# Patient Record
Sex: Male | Born: 1970
Health system: Southern US, Community
[De-identification: ages and names within clinical notes are randomized; demographics above are authoritative.]

## PROBLEM LIST (undated history)

## (undated) DIAGNOSIS — M1990 Unspecified osteoarthritis, unspecified site: Secondary | ICD-10-CM

## (undated) DIAGNOSIS — K219 Gastro-esophageal reflux disease without esophagitis: Secondary | ICD-10-CM

## (undated) DIAGNOSIS — J45909 Unspecified asthma, uncomplicated: Secondary | ICD-10-CM

## (undated) DIAGNOSIS — F419 Anxiety disorder, unspecified: Secondary | ICD-10-CM

## (undated) DIAGNOSIS — M810 Age-related osteoporosis without current pathological fracture: Secondary | ICD-10-CM

## (undated) DIAGNOSIS — R0600 Dyspnea, unspecified: Secondary | ICD-10-CM

## (undated) DIAGNOSIS — G8929 Other chronic pain: Secondary | ICD-10-CM

## (undated) DIAGNOSIS — F32A Depression, unspecified: Secondary | ICD-10-CM

## (undated) DIAGNOSIS — G4730 Sleep apnea, unspecified: Secondary | ICD-10-CM

## (undated) DIAGNOSIS — I209 Angina pectoris, unspecified: Secondary | ICD-10-CM

## (undated) DIAGNOSIS — I499 Cardiac arrhythmia, unspecified: Secondary | ICD-10-CM

## (undated) DIAGNOSIS — E785 Hyperlipidemia, unspecified: Secondary | ICD-10-CM

## (undated) DIAGNOSIS — T8859XA Other complications of anesthesia, initial encounter: Secondary | ICD-10-CM

## (undated) DIAGNOSIS — I10 Essential (primary) hypertension: Secondary | ICD-10-CM

## (undated) DIAGNOSIS — E0590 Thyrotoxicosis, unspecified without thyrotoxic crisis or storm: Secondary | ICD-10-CM

## (undated) DIAGNOSIS — R519 Headache, unspecified: Secondary | ICD-10-CM

## (undated) DIAGNOSIS — T7840XA Allergy, unspecified, initial encounter: Secondary | ICD-10-CM

## (undated) DIAGNOSIS — S02609A Fracture of mandible, unspecified, initial encounter for closed fracture: Secondary | ICD-10-CM

## (undated) DIAGNOSIS — T4145XA Adverse effect of unspecified anesthetic, initial encounter: Secondary | ICD-10-CM

## (undated) DIAGNOSIS — F329 Major depressive disorder, single episode, unspecified: Secondary | ICD-10-CM

## (undated) DIAGNOSIS — Z8489 Family history of other specified conditions: Secondary | ICD-10-CM

## (undated) DIAGNOSIS — R51 Headache: Secondary | ICD-10-CM

## (undated) HISTORY — DX: Allergy, unspecified, initial encounter: T78.40XA

## (undated) HISTORY — PX: CARDIAC CATHETERIZATION: SHX172

## (undated) HISTORY — DX: Essential (primary) hypertension: I10

## (undated) HISTORY — DX: Anxiety disorder, unspecified: F41.9

## (undated) HISTORY — DX: Thyrotoxicosis, unspecified without thyrotoxic crisis or storm: E05.90

## (undated) HISTORY — DX: Unspecified asthma, uncomplicated: J45.909

## (undated) HISTORY — DX: Age-related osteoporosis without current pathological fracture: M81.0

## (undated) HISTORY — PX: ANKLE FRACTURE SURGERY: SHX122

## (undated) HISTORY — DX: Other chronic pain: G89.29

## (undated) HISTORY — DX: Headache: R51

## (undated) HISTORY — PX: HERNIA REPAIR: SHX51

## (undated) HISTORY — PX: ANKLE FUSION: SHX881

## (undated) HISTORY — PX: FRACTURE SURGERY: SHX138

## (undated) HISTORY — DX: Gastro-esophageal reflux disease without esophagitis: K21.9

## (undated) HISTORY — PX: MANDIBLE FRACTURE SURGERY: SHX706

## (undated) HISTORY — PX: FEMUR FRACTURE SURGERY: SHX633

## (undated) HISTORY — DX: Headache, unspecified: R51.9

---

## 2012-05-10 ENCOUNTER — Emergency Department: Payer: Self-pay | Admitting: Emergency Medicine

## 2015-03-18 ENCOUNTER — Encounter: Payer: Self-pay | Admitting: Family Medicine

## 2015-03-18 ENCOUNTER — Ambulatory Visit (INDEPENDENT_AMBULATORY_CARE_PROVIDER_SITE_OTHER): Payer: Self-pay | Admitting: Family Medicine

## 2015-03-18 ENCOUNTER — Ambulatory Visit: Payer: Self-pay | Admitting: Family Medicine

## 2015-03-18 VITALS — BP 148/100 | HR 66 | Temp 98.6°F | Ht 66.14 in | Wt 204.4 lb

## 2015-03-18 DIAGNOSIS — R1012 Left upper quadrant pain: Secondary | ICD-10-CM

## 2015-03-18 DIAGNOSIS — M2142 Flat foot [pes planus] (acquired), left foot: Secondary | ICD-10-CM

## 2015-03-18 DIAGNOSIS — R51 Headache: Secondary | ICD-10-CM

## 2015-03-18 DIAGNOSIS — R079 Chest pain, unspecified: Secondary | ICD-10-CM

## 2015-03-18 DIAGNOSIS — G44209 Tension-type headache, unspecified, not intractable: Secondary | ICD-10-CM

## 2015-03-18 DIAGNOSIS — R519 Headache, unspecified: Secondary | ICD-10-CM | POA: Insufficient documentation

## 2015-03-18 DIAGNOSIS — I10 Essential (primary) hypertension: Secondary | ICD-10-CM

## 2015-03-18 DIAGNOSIS — M79672 Pain in left foot: Secondary | ICD-10-CM

## 2015-03-18 LAB — COMPREHENSIVE METABOLIC PANEL
ALK PHOS: 60 U/L (ref 39–117)
ALT: 23 U/L (ref 0–53)
AST: 19 U/L (ref 0–37)
Albumin: 4.1 g/dL (ref 3.5–5.2)
BILIRUBIN TOTAL: 0.4 mg/dL (ref 0.2–1.2)
BUN: 14 mg/dL (ref 6–23)
CALCIUM: 9.4 mg/dL (ref 8.4–10.5)
CO2: 26 mEq/L (ref 19–32)
Chloride: 106 mEq/L (ref 96–112)
Creatinine, Ser: 0.76 mg/dL (ref 0.40–1.50)
GFR: 117.57 mL/min (ref >60.00)
GLUCOSE: 95 mg/dL (ref 70–99)
POTASSIUM: 3.9 meq/L (ref 3.5–5.1)
Sodium: 141 mEq/L (ref 135–145)
TOTAL PROTEIN: 7.2 g/dL (ref 6.0–8.3)

## 2015-03-18 LAB — CBC
HEMATOCRIT: 44.2 % (ref 39.0–52.0)
HEMOGLOBIN: 14.9 g/dL (ref 13.0–17.0)
MCHC: 33.7 g/dL (ref 30.0–36.0)
MCV: 89.6 fl (ref 78.0–100.0)
PLATELETS: 292 10*3/uL (ref 150.0–400.0)
RBC: 4.94 Mil/uL (ref 4.22–5.81)
RDW: 14 % (ref 11.5–15.5)
WBC: 7.5 10*3/uL (ref 4.0–10.5)

## 2015-03-18 LAB — LIPID PANEL
CHOLESTEROL: 205 mg/dL — AB (ref 0–200)
HDL: 50.5 mg/dL (ref >39.00)
LDL Cholesterol: 144 mg/dL — ABNORMAL HIGH (ref 0–99)
NonHDL: 154.63
Total CHOL/HDL Ratio: 4
Triglycerides: 54 mg/dL (ref 0.0–149.0)
VLDL: 10.8 mg/dL (ref 0.0–40.0)

## 2015-03-18 LAB — TSH: TSH: 1.04 u[IU]/mL (ref 0.35–4.50)

## 2015-03-18 MED ORDER — AMLODIPINE BESYLATE 5 MG PO TABS: 5.0000 mg | ORAL_TABLET | Freq: Every day | ORAL | Status: DC

## 2015-03-18 MED ORDER — OMEPRAZOLE 20 MG PO CPDR: 20.0000 mg | DELAYED_RELEASE_CAPSULE | Freq: Every day | ORAL | Status: DC
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
## 2015-03-18 NOTE — Assessment & Plan Note (Signed)
Patient with hypertension and not on medication in a number of years. BP elevated today. Will start on amlodipine today. No end organ symptoms at this time. Will check CMET. F/u for nurse visit for BP check in one week. Is to check BP at home daily. If <100/60 will inform the office. If develops light headedness will inform the office. Given return precautions.

## 2015-03-18 NOTE — Assessment & Plan Note (Signed)
Patient with left upper quadrant abdominal pain for a number of years. Has had some diarrhea with this. Abdominal exam is benign today. No masses palpated. Could be related to GERD vs  MSK issue vs colonic issue with diarrhea. Doubt splenic issue given normal exam. Will check CMET today. Will start on prilosec for GERD. If no improvement with this or if labs abnormal will complete US abdomen to evaluate further. Given return precautions.

## 2015-03-18 NOTE — Patient Instructions (Addendum)
Nice to meet you. We will refer you to cardiology, neurology, and sports medicine.  We will check lab work today. We will place you on amlodipine for your blood pressure.  We will start you on omeprazole for your reflux.  We will obtain an MRI of your brain.  If you develop numbness, weakness, headaches, vision changes, chest pain, shortness of breath, sweating, worsening pain, abdominal pain, nausea, vomiting, diarrhea, or blood in your stool please seek medical attention.

## 2015-03-18 NOTE — Assessment & Plan Note (Signed)
Patient with typical anginal chest pain. Has cardiac history per patient. Associated with palpitations, dyspnea, and intermittent light headedness. Unlikely PE given no history of PE and stable HR and O2 sat. Unlikely pulmonary process given normal exam today and normal O2 sat. EKG reassuring today. Will refer to cardiology for further evaluation. Check TSH, CMET, CBC, and lipid panel. Given return precautions.
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
## 2015-03-18 NOTE — Progress Notes (Signed)
Pre visit review using our clinic review tool, if applicable. No additional management support is needed unless otherwise documented below in the visit note. 

## 2015-03-18 NOTE — Assessment & Plan Note (Signed)
Patient with collapse of transverse arch. Suspect this is the cause of his foot pain. Could also be morton neuroma given location. Suspect this is contributing to his knee and hip pain. Neurovascularly intact. Will refer to sports med for consideration of orthotics. Given return precautions.

## 2015-03-22 ENCOUNTER — Encounter: Payer: Self-pay | Admitting: Family Medicine

## 2015-03-22 ENCOUNTER — Telehealth: Payer: Self-pay | Admitting: Family Medicine

## 2015-03-22 NOTE — Telephone Encounter (Signed)
Attempted to call patient to discuss lab results and to attempt to confirm the type of metal plate he has in his jaw, though there was no answer. Left VM advising him to call back to the office. Will await his call.

## 2015-03-23 MED ORDER — ASPIRIN EC 81 MG PO TBEC: 81.0000 mg | DELAYED_RELEASE_TABLET | Freq: Every day | ORAL | Status: DC

## 2015-03-23 MED ORDER — ATORVASTATIN CALCIUM 40 MG PO TABS: 40.0000 mg | ORAL_TABLET | Freq: Every day | ORAL | Status: DC

## 2015-03-23 NOTE — Telephone Encounter (Signed)
Spoke with patient. Advised of lab results. Given history of possible MI in past will start on lipitor and aspirin as he denies any bleeding issues. Discussed the plates and screws in his jaw and he does not know the type of metal used. He notes this was done 20+ years ago. He is unsure if he has had an MRI previously, though notes he had some sort of scan at [facility] in the past that he thinks was a CT scan. He is going to call the neurologist office in [place] to set up an appointment for further evaluation. He notes no current symptoms. Feels well. Given return precautions.

## 2015-03-24 ENCOUNTER — Ambulatory Visit (INDEPENDENT_AMBULATORY_CARE_PROVIDER_SITE_OTHER): Payer: 59

## 2015-03-24 VITALS — BP 142/90 | HR 69 | Resp 18

## 2015-03-24 DIAGNOSIS — I10 Essential (primary) hypertension: Secondary | ICD-10-CM | POA: Diagnosis not present

## 2015-03-24 NOTE — Progress Notes (Signed)
Patient came in for BP check.  Patient has been taking amlodopine per orders, no issues.  Taking BP at home and it has been in the 150's/90's per the patient.  Checked BP on bilateral upper extremities.  See vitals for documentation.  Patient also asked if paperwork was completed, gave DMV forms and asked him to sign medical release for [facility].  Please advise on any changes?

## 2015-03-28 ENCOUNTER — Ambulatory Visit: Payer: Self-pay | Admitting: Neurology

## 2015-03-28 NOTE — Progress Notes (Addendum)
Please advise patient to increase amlodipine to 10 mg daily. He can take two 5 mg tablets each day for this until he runs out of his current prescription then we will send in a new prescription. He should f/u with me in the office in 2 weeks.

## 2015-03-28 NOTE — Progress Notes (Signed)
Left message for patient to return my call.

## 2015-03-29 NOTE — Progress Notes (Signed)
Spoke with patient he verbalized understanding to increase his medication.  Scheduled follow up appointment in 2 weeks with Dr. Caryl Bis.

## 2015-04-06 ENCOUNTER — Ambulatory Visit: Payer: 59 | Attending: Family Medicine

## 2015-04-06 VITALS — BP 136/89 | HR 61

## 2015-04-06 DIAGNOSIS — R531 Weakness: Secondary | ICD-10-CM | POA: Insufficient documentation

## 2015-04-06 DIAGNOSIS — R262 Difficulty in walking, not elsewhere classified: Secondary | ICD-10-CM | POA: Diagnosis present

## 2015-04-06 DIAGNOSIS — M25572 Pain in left ankle and joints of left foot: Secondary | ICD-10-CM | POA: Diagnosis present

## 2015-04-06 DIAGNOSIS — M2142 Flat foot [pes planus] (acquired), left foot: Secondary | ICD-10-CM | POA: Diagnosis not present

## 2015-04-06 DIAGNOSIS — M25562 Pain in left knee: Secondary | ICD-10-CM | POA: Insufficient documentation

## 2015-04-06 DIAGNOSIS — M25552 Pain in left hip: Secondary | ICD-10-CM | POA: Insufficient documentation

## 2015-04-06 DIAGNOSIS — M25571 Pain in right ankle and joints of right foot: Secondary | ICD-10-CM | POA: Insufficient documentation

## 2015-04-06 DIAGNOSIS — M25561 Pain in right knee: Secondary | ICD-10-CM | POA: Insufficient documentation

## 2015-04-06 NOTE — Patient Instructions (Signed)
On your back, knees bent, yellow band around your knees, stretch band out to the side with your knees. Do 10 repetition, perform 3 sets daily.
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
## 2015-04-08 ENCOUNTER — Telehealth: Payer: Self-pay | Admitting: Family Medicine

## 2015-04-11 ENCOUNTER — Encounter: Payer: Self-pay | Admitting: Family Medicine

## 2015-04-11 ENCOUNTER — Ambulatory Visit (INDEPENDENT_AMBULATORY_CARE_PROVIDER_SITE_OTHER): Payer: 59 | Admitting: Family Medicine

## 2015-04-11 ENCOUNTER — Ambulatory Visit: Payer: 59

## 2015-04-11 VITALS — BP 118/74 | HR 65 | Temp 98.5°F | Ht 66.14 in | Wt 203.0 lb

## 2015-04-11 DIAGNOSIS — E785 Hyperlipidemia, unspecified: Secondary | ICD-10-CM

## 2015-04-11 DIAGNOSIS — Z23 Encounter for immunization: Secondary | ICD-10-CM | POA: Diagnosis not present

## 2015-04-11 DIAGNOSIS — R1012 Left upper quadrant pain: Secondary | ICD-10-CM | POA: Diagnosis not present

## 2015-04-11 DIAGNOSIS — R079 Chest pain, unspecified: Secondary | ICD-10-CM

## 2015-04-11 DIAGNOSIS — G44229 Chronic tension-type headache, not intractable: Secondary | ICD-10-CM

## 2015-04-11 DIAGNOSIS — I10 Essential (primary) hypertension: Secondary | ICD-10-CM | POA: Diagnosis not present

## 2015-04-11 LAB — COMPREHENSIVE METABOLIC PANEL
ALBUMIN: 4.2 g/dL (ref 3.5–5.2)
ALK PHOS: 68 U/L (ref 39–117)
ALT: 26 U/L (ref 0–53)
AST: 19 U/L (ref 0–37)
BILIRUBIN TOTAL: 0.5 mg/dL (ref 0.2–1.2)
BUN: 14 mg/dL (ref 6–23)
CALCIUM: 9.6 mg/dL (ref 8.4–10.5)
CO2: 28 mEq/L (ref 19–32)
Chloride: 106 mEq/L (ref 96–112)
Creatinine, Ser: 0.72 mg/dL (ref 0.40–1.50)
GFR: 125.1 mL/min (ref >60.00)
Glucose, Bld: 104 mg/dL — ABNORMAL HIGH (ref 70–99)
POTASSIUM: 3.9 meq/L (ref 3.5–5.1)
Sodium: 142 mEq/L (ref 135–145)
TOTAL PROTEIN: 7.3 g/dL (ref 6.0–8.3)

## 2015-04-11 MED ORDER — AMLODIPINE BESYLATE 10 MG PO TABS: 10.0000 mg | ORAL_TABLET | Freq: Every day | ORAL | Status: DC

## 2015-04-11 NOTE — Assessment & Plan Note (Signed)
No recent occurrence of this. Has seen cardiology. Scheduled for stress test tomorrow. Given return precautions.

## 2015-04-11 NOTE — Progress Notes (Signed)
Patient ID: MALAK ORANTES, male   DOB: [birth year], 44 y.o.   MRN: [id number]  [name], MD Phone: [phone]  CASHIS RILL is a 44 y.o. male who presents today for follow-up.  HYPERTENSION Disease Monitoring Home BP Monitoring 120s/80s Chest pain- see below    Dyspnea- see below Medications Compliance-  taking amlodipine 10 mg daily. Lightheadedness-  no  Edema- no  HYPERLIPIDEMIA Symptoms Chest pain on exertion:  See below   Leg claudication:   No Medications: Compliance- taking Lipitor Right upper quadrant pain- no  Muscle aches- no  Chest pain: Patient notes he has not had chest pain in several weeks. In the past this has been pressure in the center of his chest. Can occur whenever he is sitting or on exertion. Doesn't specifically improve with rest. Has some associated shortness of breath. He is seeing cardiology already and is scheduled for a stress test tomorrow. No chest pain or shortness of breath at this time.  Headaches: Patient notes continued mild headaches. These occur most days out of the week. They are gradual in onset. He notes it is a light headache. He does note some associated numbness that is scattered and can occur in his left lower extremity, his left upper extremity, his right lower extremity, and his right upper extremity though occurs mostly in the left side of his body. Occurs every 1-2 weeks. Last occurrence was yesterday in the dorsal portion of his foot and medial aspect of his lower extremity below the knee and was described as an IT trainer current. He denies any weakness. Denies any vision changes. Some mild neck soreness, though has full range of motion of his neck and no neck pain at this time. He denies back pain. Denies incontinence, saddle anesthesia, fever, and history of cancer. He has not seen neurology. He reports at this visit that he has had an MRI previously and that his last visit he could not remember what kind of scan he had had in the  past.  Left upper quadrant pain: He notes this is a chronic issue for many years. Occurs once a week. States it is a stabbing pain. It can last from one hour to 24 hours. Notes minimal nausea with this. He has no vomiting or diarrhea with this. It does not radiate anywhere. He started on a PPI as last visit and this has not helped this discomfort.    PMH: former smoker.   ROS see HPI   Objective  Physical Exam Filed Vitals:   04/11/15 1000  BP: 118/74  Pulse:   Temp:     Physical Exam  Constitutional: He is well-developed, well-nourished, and in no distress.  HENT:  Head: Normocephalic and atraumatic.  Right Ear: External ear normal.  Left Ear: External ear normal.  Mouth/Throat: Oropharynx is clear and moist. No oropharyngeal exudate.  Eyes: Conjunctivae are normal. Pupils are equal, round, and reactive to light.  Neck: Normal range of motion. Neck supple.  Cardiovascular: Normal rate, regular rhythm and normal heart sounds.  Exam reveals no gallop and no friction rub.   No murmur heard. Pulmonary/Chest: Effort normal and breath sounds normal. No respiratory distress. He has no wheezes. He has no rales.  Abdominal: Soft. Bowel sounds are normal. He exhibits no distension. There is no tenderness. There is no rebound and no guarding.  Musculoskeletal:   No midline neck tenderness, no midline spine tenderness, no muscular neck or back tenderness, no neck or back swelling, no neck erythema, no  spasm  Lymphadenopathy:    He has no cervical adenopathy.  Neurological: He is alert.  CN 2-12 intact, 5/5 strength in bilateral biceps, triceps, grip, quads, hamstrings, plantar and dorsiflexion, sensation to light touch intact in bilateral UE and LE, normal gait, 2+ patellar reflexes  Skin: Skin is warm and dry. He is not diaphoretic.     Assessment/Plan: Please see individual problem list.  Essential hypertension At goal on amlodipine. Tolerating medication. We'll refill  amlodipine 10 mg daily.  Headache Patient with persistent daily headaches. Notes a long history of intermittent numbness that today is described as an electric shock type pain that may or may not be associated with the headaches. He is neurologically intact today. Suspect headaches are tension in nature. Suspect reported numbness could be related to nerve entrapment versus sciatica versus MS. Given patient reports prior MRI done at [facility] care everywhere was checked and prior MRI report reviewed. Given that he has had a prior MRI with plates in his jaw discussed the option of obtaining an MRI prior to seeing neurology said that they would have the results versus awaiting his neurology appointment. Patient opted to obtain the MRI. We will order this and he will follow-up with neurology. Given return precautions  Abdominal pain, left upper quadrant This is stable. Abdominal exam is benign today. Discussed options of imaging versus monitoring. We'll obtain ultrasound as an initial imaging test to evaluate abdomen. Given return precautions.  Chest pain No recent occurrence of this. Has seen cardiology. Scheduled for stress test tomorrow. Given return precautions.  Hyperlipidemia Tolerating medication. We'll continue Lipitor. Check CMET today.    Orders Placed This Encounter  Procedures  . MR Brain Wo Contrast    Standing Status: Future     Number of Occurrences:      Standing Expiration Date: 06/10/2016    Order Specific Question:  Reason for Exam (SYMPTOM  OR DIAGNOSIS REQUIRED)    Answer:  headaches, intermittent numbness    Order Specific Question:  Preferred imaging location?    Answer:  [facility]    Order Specific Question:  Does the patient have a pacemaker or implanted devices?    Answer:  Yes    Order Specific Question:  Manufacturer of pacemake or implanted device?    Answer:  unknown - does not have a pacemaker, he had plates and pins inserted in her jaw 20+ years ago     Order Specific Question:  What is the patient's sedation requirement?    Answer:  No Sedation  . US Abdomen Complete    Standing Status: Future     Number of Occurrences: 1     Standing Expiration Date: 06/10/2016    Order Specific Question:  Reason for Exam (SYMPTOM  OR DIAGNOSIS REQUIRED)    Answer:  left upper quadrant abdominal pain    Order Specific Question:  Preferred imaging location?    Answer:  [facility]  . Comp Met (CMET)    Meds ordered this encounter  Medications  . amLODipine (NORVASC) 10 MG tablet    Sig: Take 1 tablet (10 mg total) by mouth daily.    Dispense:  90 tablet    Refill:  1    [name]

## 2015-04-11 NOTE — Assessment & Plan Note (Addendum)
Patient with persistent daily headaches. Notes a long history of intermittent numbness that today is described as an electric shock type pain that may or may not be associated with the headaches. He is neurologically intact today. Suspect headaches are tension in nature. Suspect reported numbness could be related to nerve entrapment versus sciatica versus MS. Given patient reports prior MRI done at [facility] care everywhere was checked and prior MRI report reviewed. Given that he has had a prior MRI with plates in his jaw discussed the option of obtaining an MRI prior to seeing neurology so that they would have the results versus awaiting his neurology appointment. Patient opted to obtain the MRI. We will order this and he will follow-up with neurology. Given return precautions

## 2015-04-11 NOTE — Patient Instructions (Signed)
Nice to see you. Please keep your stress test appointment tomorrow. We will obtain an MRI of your brain prior to you seeing neurology.  We will obtain an ultrasound to evaluate your abdomen.  If you develop headaches, persistent numbness, weakness, vision changes, chest pain, shortness of breath, sweating, abdominal pain, nausea, vomiting, diarrhea, or blood in your stool please seek medical attention.

## 2015-04-11 NOTE — Progress Notes (Signed)
Pre visit review using our clinic review tool, if applicable. No additional management support is needed unless otherwise documented below in the visit note. 

## 2015-04-11 NOTE — Assessment & Plan Note (Signed)
This is stable. Abdominal exam is benign today. Discussed options of imaging versus monitoring. We'll obtain ultrasound as an initial imaging test to evaluate abdomen. Given return precautions.

## 2015-04-11 NOTE — Assessment & Plan Note (Signed)
At goal on amlodipine. Tolerating medication. We'll refill amlodipine 10 mg daily.

## 2015-04-13 ENCOUNTER — Other Ambulatory Visit: Payer: Self-pay | Admitting: Family Medicine

## 2015-04-13 ENCOUNTER — Ambulatory Visit
Admission: RE | Admit: 2015-04-13 | Discharge: 2015-04-13 | Disposition: A | Discharge status: Home / Self Care | Payer: 59 | Source: Ambulatory Visit | Attending: Family Medicine | Admitting: Family Medicine

## 2015-04-13 DIAGNOSIS — M2141 Flat foot [pes planus] (acquired), right foot: Secondary | ICD-10-CM

## 2015-04-13 DIAGNOSIS — R1012 Left upper quadrant pain: Secondary | ICD-10-CM

## 2015-04-13 DIAGNOSIS — K8020 Calculus of gallbladder without cholecystitis without obstruction: Secondary | ICD-10-CM | POA: Diagnosis not present

## 2015-04-13 DIAGNOSIS — M2142 Flat foot [pes planus] (acquired), left foot: Principal | ICD-10-CM

## 2015-04-13 DIAGNOSIS — E785 Hyperlipidemia, unspecified: Secondary | ICD-10-CM | POA: Insufficient documentation

## 2015-04-13 NOTE — Assessment & Plan Note (Signed)
Tolerating medication. We'll continue Lipitor. Check CMET today.

## 2015-04-19 ENCOUNTER — Ambulatory Visit
Admission: RE | Admit: 2015-04-19 | Discharge: 2015-04-19 | Disposition: A | Discharge status: Home / Self Care | Payer: 59 | Source: Ambulatory Visit | Attending: Family Medicine | Admitting: Family Medicine

## 2015-04-19 DIAGNOSIS — R1012 Left upper quadrant pain: Secondary | ICD-10-CM | POA: Diagnosis present

## 2015-04-19 MED ORDER — IOHEXOL 300 MG/ML  SOLN
100.0000 mL | Freq: Once | INTRAMUSCULAR | Status: AC | PRN
  Administered 2015-04-19: 100 mL via INTRAVENOUS

## 2015-04-20 ENCOUNTER — Ambulatory Visit
Admission: RE | Admit: 2015-04-20 | Discharge: 2015-04-20 | Disposition: A | Discharge status: Home / Self Care | Payer: 59 | Source: Ambulatory Visit | Attending: Family Medicine | Admitting: Family Medicine

## 2015-04-20 DIAGNOSIS — J329 Chronic sinusitis, unspecified: Secondary | ICD-10-CM | POA: Diagnosis not present

## 2015-04-20 DIAGNOSIS — R200 Anesthesia of skin: Secondary | ICD-10-CM | POA: Diagnosis present

## 2015-04-20 DIAGNOSIS — G44229 Chronic tension-type headache, not intractable: Secondary | ICD-10-CM

## 2015-04-20 DIAGNOSIS — R51 Headache: Secondary | ICD-10-CM | POA: Diagnosis present

## 2015-04-21 ENCOUNTER — Other Ambulatory Visit: Payer: Self-pay | Admitting: Family Medicine

## 2015-04-21 DIAGNOSIS — R1012 Left upper quadrant pain: Principal | ICD-10-CM

## 2015-04-21 DIAGNOSIS — G8929 Other chronic pain: Secondary | ICD-10-CM

## 2015-04-22 ENCOUNTER — Ambulatory Visit: Payer: 59 | Admitting: Family Medicine

## 2015-05-02 ENCOUNTER — Ambulatory Visit: Payer: 59 | Admitting: Family Medicine

## 2015-05-12 ENCOUNTER — Ambulatory Visit: Payer: Self-pay | Admitting: Family Medicine

## 2015-05-13 ENCOUNTER — Ambulatory Visit: Payer: 59 | Admitting: Family Medicine

## 2015-05-16 ENCOUNTER — Ambulatory Visit: Payer: 59 | Admitting: Family Medicine

## 2015-06-14 NOTE — Progress Notes (Signed)
This encounter was created in error - please disregard.  This encounter was created in error - please disregard.

## 2015-07-25 ENCOUNTER — Other Ambulatory Visit: Payer: Self-pay | Admitting: Family Medicine

## 2015-09-08 ENCOUNTER — Other Ambulatory Visit: Payer: Self-pay | Admitting: Family Medicine

## 2015-10-21 ENCOUNTER — Telehealth: Payer: Self-pay | Admitting: Family Medicine

## 2015-10-21 MED ORDER — OMEPRAZOLE 20 MG PO CPDR: DELAYED_RELEASE_CAPSULE | ORAL | Status: DC

## 2015-10-21 NOTE — Telephone Encounter (Signed)
Refilled for 30 day supply and scheduled patient appointment for follow up. Patient has canceled 2 appointments. Advised he would need to keep this appointment for further refills. Last seen 10/16

## 2015-10-21 NOTE — Telephone Encounter (Signed)
Pt needs refill on omeprazole (PRILOSEC) 20 MG capsule.Durene Cal to [place] is now only using [place].Marland Kitchen

## 2015-10-21 NOTE — Telephone Encounter (Signed)
Noted  

## 2015-10-27 ENCOUNTER — Ambulatory Visit: Payer: 59 | Admitting: Family Medicine

## 2015-10-29 DIAGNOSIS — S93505A Unspecified sprain of left lesser toe(s), initial encounter: Secondary | ICD-10-CM | POA: Diagnosis not present

## 2015-10-29 DIAGNOSIS — S90122A Contusion of left lesser toe(s) without damage to nail, initial encounter: Secondary | ICD-10-CM | POA: Diagnosis not present

## 2015-10-31 ENCOUNTER — Encounter: Payer: Self-pay | Admitting: Family Medicine

## 2015-10-31 ENCOUNTER — Ambulatory Visit (INDEPENDENT_AMBULATORY_CARE_PROVIDER_SITE_OTHER): Payer: 59 | Admitting: Family Medicine

## 2015-10-31 VITALS — BP 130/92 | HR 80 | Temp 98.5°F | Ht 66.14 in | Wt 215.4 lb

## 2015-10-31 DIAGNOSIS — S99922A Unspecified injury of left foot, initial encounter: Secondary | ICD-10-CM

## 2015-10-31 DIAGNOSIS — M7989 Other specified soft tissue disorders: Secondary | ICD-10-CM | POA: Diagnosis not present

## 2015-10-31 DIAGNOSIS — R200 Anesthesia of skin: Secondary | ICD-10-CM | POA: Insufficient documentation

## 2015-10-31 DIAGNOSIS — I10 Essential (primary) hypertension: Secondary | ICD-10-CM

## 2015-10-31 DIAGNOSIS — K219 Gastro-esophageal reflux disease without esophagitis: Secondary | ICD-10-CM | POA: Insufficient documentation

## 2015-10-31 DIAGNOSIS — M25471 Effusion, right ankle: Secondary | ICD-10-CM | POA: Insufficient documentation

## 2015-10-31 MED ORDER — HYDROCHLOROTHIAZIDE 25 MG PO TABS: 12.5000 mg | ORAL_TABLET | Freq: Every day | ORAL | Status: DC

## 2015-10-31 MED ORDER — PANTOPRAZOLE SODIUM 40 MG PO TBEC: 40.0000 mg | DELAYED_RELEASE_TABLET | Freq: Every day | ORAL | Status: DC

## 2015-10-31 NOTE — Assessment & Plan Note (Signed)
Patient notes continued issues with scattered numbness though more recently intermittent numbness in his left arm. Minimal neck discomfort. Prior MRI was unremarkable for cause. Discussed options for management and workup including x-ray imaging of his neck, MRI imaging of his neck, or referral to neurology. We decided on referral to neurology for further evaluation and workup. He'll continue to monitor. He is given return precautions.

## 2015-10-31 NOTE — Assessment & Plan Note (Signed)
Bilateral ankle swelling likely related to amlodipine. No CHF symptoms. We'll check labs as outlined below. We'll discontinue amlodipine.

## 2015-10-31 NOTE — Assessment & Plan Note (Signed)
Diastolics slightly above goal here and per report at home. Amlodipine has resulted in lower extremity swelling. We will change him to hydrochlorothiazide. We'll check lab work today. He'll monitor his blood pressure at home and if not improved below 140/90 in the next week he will let us know. He will return for a nurse visit in 1 week for blood pressure check. He'll follow-up with me in one month.

## 2015-10-31 NOTE — Assessment & Plan Note (Signed)
Suspect soft tissue injury given prior reported x-ray being negative. We will request the records to determine the next step in management. I've given him a handwritten prescription for a wooden shoe to go get fit more appropriately. He'll continue to monitor. Given return precautions. He is given a work note to keep him out through Wednesday.

## 2015-10-31 NOTE — Patient Instructions (Addendum)
Nice to see you. I have started you on pantoprazole for your reflux. We are going to change your blood pressure medicine from amlodipine to hydrochlorothiazide. Please stop the amlodipine. Review them check some lab work. We're going to request records from the urgent care. We are going to refer you to GI for evaluation of your reflux.  Please consider whether or not you wanted MRI of your neck or want to see neurology for the numbness. If you develop numbness, weakness, abdominal pain, chest pain, shortness of breath, or any new or changing symptoms please seek medical attention.

## 2015-10-31 NOTE — Progress Notes (Signed)
Pre visit review using our clinic review tool, if applicable. No additional management support is needed unless otherwise documented below in the visit note. 

## 2015-10-31 NOTE — Progress Notes (Signed)
Patient ID: Wesley Bridges, male   DOB: 1970-12-18, 45 y.o.   MRN: [id number]  [name], MD Phone: [phone]  Wesley Bridges is a 45 y.o. male who presents today for follow-up.  Left second toe injury: Patient notes on Friday he bumped his foot into a step and hit his left second toe. He went to an urgent care to have it evaluated. He had x-rays done that he states he was advised had no fracture though they were awaiting radiology review. He has not heard back regarding this. Notes his toe hurts. Hurts to walk. They gave him a wooden shoe that he notes is too big for him. He notes some tingling in the toe yesterday though none today. 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 Ibuprofen is been somewhat beneficial.  GERD: Patient notes he gets some regurgitation though no burning or sour taste. No blood in the stool. No abdominal pain. No recent pneumonia. Omeprazole does help some no does not prevent all or regurgitation.  HYPERTENSION Disease Monitoring Home BP Monitoring notes typically diastolics run in the 31R Chest pain- no    Dyspnea- no Medications Compliance-  taking amlodipine.  Edema- does note some bilateral ankle edema that gets better with propping his legs up and after sleeping. No orthopnea.  Patient again notes intermittent numbness though has particularly been occurring in his left arm the last several weeks. Does not occur every day. It is not exertional. It is not associated with chest pain or shortness of breath or diaphoresis. Occurs a couple times a week. Notes a small amount of neck pain with this. No shooting pain down his arm. No weakness in his arm. Does note occasional numbness in his feet bilaterally though none in a couple of weeks. No numbness at this time.  PMH: Former smoker   ROS see history of present illness  Objective  Physical Exam Filed Vitals:   10/31/15 1422  BP: 130/92  Pulse:  80  Temp: 98.5 F (36.9 C)    BP Readings from Last 3 Encounters:  10/31/15 130/92  04/11/15 118/74  04/06/15 136/89   Wt Readings from Last 3 Encounters:  10/31/15 215 lb 6 oz (97.693 kg)  04/11/15 203 lb (92.08 kg)  03/18/15 204 lb 6.4 oz (92.715 kg)    Physical Exam  Constitutional: He is well-developed, well-nourished, and in no distress.  HENT:  Head: Normocephalic and atraumatic.  Right Ear: External ear normal.  Left Ear: External ear normal.  Mouth/Throat: Oropharynx is clear and moist. No oropharyngeal exudate.  Eyes: Conjunctivae are normal. Pupils are equal, round, and reactive to light.  Neck:  No midline neck tenderness, no muscular neck tenderness  Cardiovascular: Normal rate, regular rhythm and normal heart sounds.   Pulmonary/Chest: Effort normal and breath sounds normal.  Abdominal: Soft. He exhibits no distension. There is no tenderness.  Musculoskeletal:  Mild nonpitting edema noted bilateral ankles Left second toe with bruising over the dorsal aspect with some tenderness and minimal swelling, decreased range of motion on flexion and extension, left great toe with mild swelling noted no bruising, sensation to light touch intact in bilateral lateral toes, good capillary refill, 2+ DP pulses  Neurological: He is alert.  CN 2-12 intact, 5/5 strength in bilateral biceps, triceps, grip, quads, hamstrings, plantar and dorsiflexion, sensation to light touch intact in bilateral UE and LE, normal gait, 2+ patellar reflexes  Skin:  Skin is warm and dry. He is not diaphoretic.     Assessment/Plan: Please see individual problem list.  Injury of toe on left foot Suspect soft tissue injury given prior reported x-ray being negative. We will request the records to determine the next step in management. I've given him a handwritten prescription for a wooden shoe to go get fit more appropriately. He'll continue to monitor. Given return precautions. He is given a work note to  keep him out through Wednesday.  Esophageal reflux Not greatly controlled on omeprazole. Benign exam. We will discontinue omeprazole and start on Protonix. We'll refer to GI for consideration of EGD given persistence.  Swelling of lower extremity Bilateral ankle swelling likely related to amlodipine. No CHF symptoms. We'll check labs as outlined below. We'll discontinue amlodipine.  Essential hypertension Diastolics slightly above goal here and per report at home. Amlodipine has resulted in lower extremity swelling. We will change him to hydrochlorothiazide. We'll check lab work today. He'll monitor his blood pressure at home and if not improved below 140/90 in the next week he will let us know. He will return for a nurse visit in 1 week for blood pressure check. He'll follow-up with me in one month.  Numbness Patient notes continued issues with scattered numbness though more recently intermittent numbness in his left arm. Minimal neck discomfort. Prior MRI was unremarkable for cause. Discussed options for management and workup including x-ray imaging of his neck, MRI imaging of his neck, or referral to neurology. We decided on referral to neurology for further evaluation and workup. He'll continue to monitor. He is given return precautions.    Orders Placed This Encounter  Procedures  . Comp Met (CMET)  . TSH  . CBC  . Ambulatory referral to Gastroenterology    Referral Priority:  Routine    Referral Type:  Consultation    Referral Reason:  Specialty Services Required    Number of Visits Requested:  1  . Ambulatory referral to Neurology    Referral Priority:  Routine    Referral Type:  Consultation    Referral Reason:  Specialty Services Required    Requested Specialty:  Neurology    Number of Visits Requested:  1    Meds ordered this encounter  Medications  . pantoprazole (PROTONIX) 40 MG tablet    Sig: Take 1 tablet (40 mg total) by mouth daily.    Dispense:  30 tablet     Refill:  3  . hydrochlorothiazide (HYDRODIURIL) 25 MG tablet    Sig: Take 0.5 tablets (12.5 mg total) by mouth daily.    Dispense:  90 tablet    Refill:  [place], MD [place]

## 2015-10-31 NOTE — Assessment & Plan Note (Signed)
Not greatly controlled on omeprazole. Benign exam. We will discontinue omeprazole and start on Protonix. We'll refer to GI for consideration of EGD given persistence.

## 2015-11-01 LAB — COMPREHENSIVE METABOLIC PANEL
ALK PHOS: 77 U/L (ref 39–117)
ALT: 29 U/L (ref 0–53)
AST: 19 U/L (ref 0–37)
Albumin: 4.5 g/dL (ref 3.5–5.2)
BUN: 12 mg/dL (ref 6–23)
CHLORIDE: 103 meq/L (ref 96–112)
CO2: 30 meq/L (ref 19–32)
Calcium: 9.8 mg/dL (ref 8.4–10.5)
Creatinine, Ser: 0.95 mg/dL (ref 0.40–1.50)
GFR: 90.63 mL/min (ref >60.00)
GLUCOSE: 113 mg/dL — AB (ref 70–99)
POTASSIUM: 3.9 meq/L (ref 3.5–5.1)
SODIUM: 143 meq/L (ref 135–145)
Total Bilirubin: 0.5 mg/dL (ref 0.2–1.2)
Total Protein: 7.6 g/dL (ref 6.0–8.3)

## 2015-11-01 LAB — CBC
HCT: 44.4 % (ref 39.0–52.0)
HEMOGLOBIN: 15.3 g/dL (ref 13.0–17.0)
MCHC: 34.4 g/dL (ref 30.0–36.0)
MCV: 88.5 fl (ref 78.0–100.0)
PLATELETS: 333 10*3/uL (ref 150.0–400.0)
RBC: 5.01 Mil/uL (ref 4.22–5.81)
RDW: 14.1 % (ref 11.5–15.5)
WBC: 9.4 10*3/uL (ref 4.0–10.5)

## 2015-11-01 LAB — TSH: TSH: 1.62 u[IU]/mL (ref 0.35–4.50)

## 2015-11-03 ENCOUNTER — Encounter: Payer: Self-pay | Admitting: Family Medicine

## 2015-11-08 ENCOUNTER — Ambulatory Visit (INDEPENDENT_AMBULATORY_CARE_PROVIDER_SITE_OTHER): Payer: 59 | Admitting: *Deleted

## 2015-11-08 DIAGNOSIS — M7989 Other specified soft tissue disorders: Secondary | ICD-10-CM

## 2015-11-08 DIAGNOSIS — I10 Essential (primary) hypertension: Secondary | ICD-10-CM | POA: Diagnosis not present

## 2015-11-08 NOTE — Progress Notes (Signed)
Patient presented one week post medication change by MD for BP check. Pressure taken in left arm with patient at rest for 5 minutes patient 02 Sat @ 98 % on room air with Pulse at 70 BPM. Patient BP recorded @ 124/84. Patient reported " I  had both legs swelling at appointment and that now my legs do not swell".

## 2015-11-09 DIAGNOSIS — R1013 Epigastric pain: Secondary | ICD-10-CM | POA: Diagnosis not present

## 2015-11-09 DIAGNOSIS — K219 Gastro-esophageal reflux disease without esophagitis: Secondary | ICD-10-CM | POA: Diagnosis not present

## 2015-11-10 NOTE — Progress Notes (Signed)
Note reviewed and agree. Blood pressure well controlled.  [name], M.D.

## 2015-11-22 ENCOUNTER — Encounter: Payer: Self-pay | Admitting: Family Medicine

## 2015-11-22 ENCOUNTER — Ambulatory Visit (INDEPENDENT_AMBULATORY_CARE_PROVIDER_SITE_OTHER): Payer: 59 | Admitting: Family Medicine

## 2015-11-22 VITALS — BP 158/114 | HR 60 | Temp 97.7°F | Ht 66.14 in | Wt 219.1 lb

## 2015-11-22 DIAGNOSIS — I10 Essential (primary) hypertension: Secondary | ICD-10-CM | POA: Diagnosis not present

## 2015-11-22 MED ORDER — LISINOPRIL-HYDROCHLOROTHIAZIDE 10-12.5 MG PO TABS: 1.0000 | ORAL_TABLET | Freq: Every day | ORAL | Status: DC

## 2015-11-22 NOTE — Assessment & Plan Note (Signed)
Established problem, worsening. Stopping HCTZ. Starting on Lisinopril/HCTZ. Labs and 7-10 days as well as a nurse visit at that time for BP check.

## 2015-11-22 NOTE — Progress Notes (Signed)
Pre visit review using our clinic review tool, if applicable. No additional management support is needed unless otherwise documented below in the visit note. 

## 2015-11-22 NOTE — Progress Notes (Signed)
   Subjective:  Patient ID: Wesley Bridges, male    DOB: 1970-11-11  Age: 45 y.o. MRN: [id number]  CC: Worsening BP  HPI: 45 year old male with hypertension presents with worsening blood pressure.  Patient states that he was recently started on HCTZ and replace of Norvasc. He states that his blood pressure has been elevated at home. Systolics in the 0000000 and diastolic ranging from 0000000 to 110's. He endorses compliance with HCTZ. He feels like it is not working. No other associated symptoms: Chest pain, shortness breath, vision changes. No known exacerbating factors.  Social Hx   Social History   Social History  . Marital Status: Married    Spouse Name: N/A  . Number of Children: N/A  . Years of Education: N/A   Social History Main Topics  . Smoking status: Former Research scientist (life sciences)  . Smokeless tobacco: None  . Alcohol Use: 3.6 - 4.8 oz/week    6-8 Cans of beer per week  . Drug Use: No  . Sexual Activity: Not Asked   Other Topics Concern  . None   Social History Narrative   Review of Systems  Respiratory: Negative.   Cardiovascular: Negative.    Objective:  BP 158/114 mmHg  Pulse 60  Temp(Src) 97.7 F (36.5 C) (Oral)  Ht 5' 6.14" (1.68 m)  Wt 219 lb 2 oz (99.394 kg)  BMI 35.22 kg/m2  SpO2 98%  BP/Weight 11/22/2015 [id number]  Systolic BP 0000000 [id number]  Diastolic BP [id number] 84 92  Wt. (Lbs) 219.13 - 215.38  BMI 35.22 - 34.61   Physical Exam  Constitutional: He is oriented to person, place, and time. He appears well-developed. No distress.  Cardiovascular: Normal rate and regular rhythm.   Pulmonary/Chest: Effort normal and breath sounds normal.  Neurological: He is alert and oriented to person, place, and time.  Psychiatric: He has a normal mood and affect.  Vitals reviewed.  Lab Results  Component Value Date   WBC 9.4 10/31/2015   HGB 15.3 10/31/2015   HCT 44.4 10/31/2015   PLT 333.0 10/31/2015   GLUCOSE 113* 10/31/2015   CHOL 205* 03/18/2015   TRIG 54.0  03/18/2015   HDL 50.50 03/18/2015   LDLCALC 144* 03/18/2015   ALT 29 10/31/2015   AST 19 10/31/2015   NA 143 10/31/2015   K 3.9 10/31/2015   CL 103 10/31/2015   CREATININE 0.95 10/31/2015   BUN 12 10/31/2015   CO2 30 10/31/2015   TSH 1.62 10/31/2015    Assessment & Plan:   Problem List Items Addressed This Visit    Essential hypertension - Primary    Established problem, worsening. Stopping HCTZ. Starting on Lisinopril/HCTZ. Labs and 7-10 days as well as a nurse visit at that time for BP check.      Relevant Medications   lisinopril-hydrochlorothiazide (PRINZIDE,ZESTORETIC) 10-12.5 MG tablet      Meds ordered this encounter  Medications  . lisinopril-hydrochlorothiazide (PRINZIDE,ZESTORETIC) 10-12.5 MG tablet    Sig: Take 1 tablet by mouth daily.    Dispense:  90 tablet    Refill:  0   Follow-up: PRN  Sasser

## 2015-11-22 NOTE — Patient Instructions (Signed)
Follow up for labs and a nurse visit (for a BP check) in 7-10 days.  Take care  Dr. Lacinda Axon

## 2015-12-01 ENCOUNTER — Ambulatory Visit: Payer: 59 | Admitting: Neurology

## 2015-12-06 ENCOUNTER — Encounter: Payer: Self-pay | Admitting: Emergency Medicine

## 2015-12-06 ENCOUNTER — Observation Stay
Admission: EM | Admit: 2015-12-06 | Discharge: 2015-12-07 | Disposition: A | Discharge status: Home / Self Care | Payer: 59 | Attending: Internal Medicine | Admitting: Internal Medicine

## 2015-12-06 ENCOUNTER — Emergency Department: Payer: 59

## 2015-12-06 DIAGNOSIS — I10 Essential (primary) hypertension: Secondary | ICD-10-CM | POA: Diagnosis not present

## 2015-12-06 DIAGNOSIS — R110 Nausea: Secondary | ICD-10-CM | POA: Diagnosis not present

## 2015-12-06 DIAGNOSIS — E669 Obesity, unspecified: Secondary | ICD-10-CM | POA: Insufficient documentation

## 2015-12-06 DIAGNOSIS — E876 Hypokalemia: Secondary | ICD-10-CM | POA: Diagnosis not present

## 2015-12-06 DIAGNOSIS — J45909 Unspecified asthma, uncomplicated: Secondary | ICD-10-CM | POA: Insufficient documentation

## 2015-12-06 DIAGNOSIS — Z8249 Family history of ischemic heart disease and other diseases of the circulatory system: Secondary | ICD-10-CM | POA: Insufficient documentation

## 2015-12-06 DIAGNOSIS — R61 Generalized hyperhidrosis: Secondary | ICD-10-CM | POA: Insufficient documentation

## 2015-12-06 DIAGNOSIS — Z87891 Personal history of nicotine dependence: Secondary | ICD-10-CM | POA: Diagnosis not present

## 2015-12-06 DIAGNOSIS — N179 Acute kidney failure, unspecified: Secondary | ICD-10-CM | POA: Diagnosis not present

## 2015-12-06 DIAGNOSIS — Z8261 Family history of arthritis: Secondary | ICD-10-CM | POA: Insufficient documentation

## 2015-12-06 DIAGNOSIS — M791 Myalgia: Secondary | ICD-10-CM | POA: Insufficient documentation

## 2015-12-06 DIAGNOSIS — Z6833 Body mass index (BMI) 33.0-33.9, adult: Secondary | ICD-10-CM | POA: Diagnosis not present

## 2015-12-06 DIAGNOSIS — R51 Headache: Secondary | ICD-10-CM | POA: Insufficient documentation

## 2015-12-06 DIAGNOSIS — R55 Syncope and collapse: Secondary | ICD-10-CM | POA: Diagnosis not present

## 2015-12-06 DIAGNOSIS — R0602 Shortness of breath: Secondary | ICD-10-CM | POA: Diagnosis not present

## 2015-12-06 DIAGNOSIS — E785 Hyperlipidemia, unspecified: Secondary | ICD-10-CM | POA: Diagnosis not present

## 2015-12-06 DIAGNOSIS — Z79899 Other long term (current) drug therapy: Secondary | ICD-10-CM | POA: Diagnosis not present

## 2015-12-06 DIAGNOSIS — K219 Gastro-esophageal reflux disease without esophagitis: Secondary | ICD-10-CM | POA: Diagnosis not present

## 2015-12-06 HISTORY — DX: Hyperlipidemia, unspecified: E78.5

## 2015-12-06 LAB — LIPID PANEL
Cholesterol: 165 mg/dL (ref 0–200)
HDL: 48 mg/dL (ref >40)
LDL CALC: 105 mg/dL — AB (ref 0–99)
Total CHOL/HDL Ratio: 3.4 RATIO
Triglycerides: 61 mg/dL (ref <150)
VLDL: 12 mg/dL (ref 0–40)

## 2015-12-06 LAB — CBC
HCT: 44.2 % (ref 40.0–52.0)
HEMOGLOBIN: 15 g/dL (ref 13.0–18.0)
MCH: 30.1 pg (ref 26.0–34.0)
MCHC: 34.1 g/dL (ref 32.0–36.0)
MCV: 88.2 fL (ref 80.0–100.0)
Platelets: 279 10*3/uL (ref 150–440)
RBC: 5.01 MIL/uL (ref 4.40–5.90)
RDW: 13.8 % (ref 11.5–14.5)
WBC: 9.7 10*3/uL (ref 3.8–10.6)

## 2015-12-06 LAB — TROPONIN I: Troponin I: <0.03 ng/mL (ref <0.031)

## 2015-12-06 LAB — BASIC METABOLIC PANEL
ANION GAP: 15 (ref 5–15)
BUN: 18 mg/dL (ref 6–20)
CALCIUM: 9.4 mg/dL (ref 8.9–10.3)
CHLORIDE: 102 mmol/L (ref 101–111)
CO2: 21 mmol/L — AB (ref 22–32)
CREATININE: 1.43 mg/dL — AB (ref 0.61–1.24)
GFR calc non Af Amer: 57 mL/min — ABNORMAL LOW (ref >60)
Glucose, Bld: 133 mg/dL — ABNORMAL HIGH (ref 65–99)
Potassium: 2.8 mmol/L — CL (ref 3.5–5.1)
SODIUM: 138 mmol/L (ref 135–145)

## 2015-12-06 MED ORDER — ACETAMINOPHEN 650 MG RE SUPP: 650.0000 mg | Freq: Four times a day (QID) | RECTAL | Status: DC | PRN

## 2015-12-06 MED ORDER — POTASSIUM CHLORIDE CRYS ER 20 MEQ PO TBCR
40.0000 meq | EXTENDED_RELEASE_TABLET | Freq: Once | ORAL | Status: AC
  Administered 2015-12-06: 40 meq via ORAL

## 2015-12-06 MED ORDER — LISINOPRIL 10 MG PO TABS
10.0000 mg | ORAL_TABLET | Freq: Every day | ORAL | Status: DC
  Administered 2015-12-07: 10 mg via ORAL
  Filled 2015-12-06: qty 1

## 2015-12-06 MED ORDER — ASPIRIN EC 81 MG PO TBEC: 81.0000 mg | DELAYED_RELEASE_TABLET | Freq: Every day | ORAL | Status: DC

## 2015-12-06 MED ORDER — ACETAMINOPHEN 325 MG PO TABS
650.0000 mg | ORAL_TABLET | Freq: Four times a day (QID) | ORAL | Status: DC | PRN
  Filled 2015-12-06: qty 2

## 2015-12-06 MED ORDER — ASPIRIN 81 MG PO CHEW
CHEWABLE_TABLET | ORAL | Status: AC
  Administered 2015-12-06: 243 mg via ORAL
  Filled 2015-12-06: qty 4

## 2015-12-06 MED ORDER — ENOXAPARIN SODIUM 40 MG/0.4ML ~~LOC~~ SOLN
40.0000 mg | SUBCUTANEOUS | Status: DC
  Filled 2015-12-06: qty 0.4

## 2015-12-06 MED ORDER — ASPIRIN 81 MG PO CHEW
243.0000 mg | CHEWABLE_TABLET | Freq: Once | ORAL | Status: AC
  Administered 2015-12-06: 243 mg via ORAL

## 2015-12-06 MED ORDER — ADULT MULTIVITAMIN W/MINERALS CH
1.0000 | ORAL_TABLET | Freq: Every day | ORAL | Status: DC
  Administered 2015-12-06: 1 via ORAL
  Filled 2015-12-06: qty 1

## 2015-12-06 MED ORDER — POTASSIUM CHLORIDE CRYS ER 20 MEQ PO TBCR
EXTENDED_RELEASE_TABLET | ORAL | Status: AC
  Administered 2015-12-06: 40 meq via ORAL
  Filled 2015-12-06: qty 2

## 2015-12-06 MED ORDER — ONDANSETRON HCL 4 MG PO TABS: 4.0000 mg | ORAL_TABLET | Freq: Four times a day (QID) | ORAL | Status: DC | PRN

## 2015-12-06 MED ORDER — ATORVASTATIN CALCIUM 20 MG PO TABS
40.0000 mg | ORAL_TABLET | Freq: Every day | ORAL | Status: DC
  Administered 2015-12-07: 40 mg via ORAL
  Filled 2015-12-06 (×2): qty 2

## 2015-12-06 MED ORDER — OXYCODONE HCL 5 MG PO TABS: 5.0000 mg | ORAL_TABLET | ORAL | Status: DC | PRN

## 2015-12-06 MED ORDER — ASPIRIN EC 81 MG PO TBEC
81.0000 mg | DELAYED_RELEASE_TABLET | Freq: Every day | ORAL | Status: DC
  Administered 2015-12-07: 81 mg via ORAL
  Filled 2015-12-06: qty 1

## 2015-12-06 MED ORDER — ONDANSETRON HCL 4 MG/2ML IJ SOLN: 4.0000 mg | Freq: Four times a day (QID) | INTRAMUSCULAR | Status: DC | PRN

## 2015-12-06 MED ORDER — POTASSIUM CHLORIDE IN NACL 20-0.9 MEQ/L-% IV SOLN
INTRAVENOUS | Status: DC
  Administered 2015-12-06: 20:00:00 via INTRAVENOUS
  Filled 2015-12-06 (×4): qty 1000

## 2015-12-06 MED ORDER — PANTOPRAZOLE SODIUM 40 MG PO TBEC
40.0000 mg | DELAYED_RELEASE_TABLET | Freq: Every day | ORAL | Status: DC
  Administered 2015-12-06: 40 mg via ORAL
  Filled 2015-12-06: qty 1

## 2015-12-06 NOTE — ED Notes (Signed)
Dr. Lucita Lora notified of critical K-dur of 2.8

## 2015-12-06 NOTE — H&P (Signed)
[place] [facility] [place] NAME: Wesley Bridges    MR#:  [id number]  DATE OF BIRTH:  Jun 18, 1971  DATE OF ADMISSION:  12/06/2015  PRIMARY CARE PHYSICIAN: [name], MD   REQUESTING/REFERRING PHYSICIAN: Dr. Larae Grooms  CHIEF COMPLAINT:   Chief Complaint  Patient presents with  . Dizziness    HISTORY OF PRESENT ILLNESS:  Wesley Bridges  is a 45 y.o. male with a known history of GERD, hypertension, Hyperlipidemia Presents to the hospital secondary to back to back near syncopal episodes today. Patient denies any prior cardiac history. No family history of any coronary artery disease. He is obese, has hypertension and hyperlipidemia and a former smoker. He was out working in the yard this afternoon, came back to eat his lunch sat down and felt lightheaded and laid on the floor to let it pass over. Once he felt better after a few seconds he sat back up again and had similar episode very actively back up again. This time it was also associated with nausea, diaphoresis and he felt nagging pain in his left shoulder and also left side of the neck and jaw. Denies any chest pain. He also had transient episode of shortness of breath as he was talking to his wife at the time. Presented to the emergency room. No EKG changes first troponin is negative. Labs indicate mild dehydration and hypokalemia.  PAST MEDICAL HISTORY:   Past Medical History  Diagnosis Date  . Asthma   . GERD (gastroesophageal reflux disease)   . Chronic headaches   . Hypertension   . Hyperlipidemia     PAST SURGICAL HISTORY:   Past Surgical History  Procedure Laterality Date  . Ankle fracture surgery    . Ankle fusion    . Femur fracture surgery    . Mandible fracture surgery      SOCIAL HISTORY:   Social History  Substance Use Topics  . Smoking status: Former Research scientist (life sciences)  . Smokeless tobacco: Not on file  . Alcohol Use: 3.6 - 4.8 oz/week    6-8 Cans of beer per week      Comment: Beers 2-3/night    FAMILY HISTORY:   Family History  Problem Relation Age of Onset  . Arthritis      parents  . Hypertension Mother     DRUG ALLERGIES:  No Known Allergies  REVIEW OF SYSTEMS:   Review of Systems  Constitutional: Positive for diaphoresis. Negative for fever, chills, weight loss and malaise/fatigue.  HENT: Negative for ear discharge, ear pain, hearing loss, nosebleeds and tinnitus.   Eyes: Negative for blurred vision, double vision and photophobia.  Respiratory: Negative for cough, hemoptysis, shortness of breath and wheezing.   Cardiovascular: Negative for chest pain, palpitations, orthopnea and leg swelling.  Gastrointestinal: Positive for nausea. Negative for heartburn, vomiting, abdominal pain, diarrhea, constipation and melena.  Genitourinary: Negative for dysuria, urgency, frequency and hematuria.  Musculoskeletal: Positive for myalgias. Negative for back pain and neck pain.       Left shoulder and neck pain  Skin: Negative for rash.  Neurological: Positive for dizziness. Negative for tingling, tremors, sensory change, speech change, focal weakness and headaches.  Endo/Heme/Allergies: Does not bruise/bleed easily.  Psychiatric/Behavioral: Negative for depression.    MEDICATIONS AT HOME:   Prior to Admission medications   Medication Sig Start Date End Date Taking? Authorizing Provider  acetaminophen (TYLENOL) 500 MG tablet Take 1,000 mg by mouth every 6 (six) hours as needed for  mild pain or headache.   Yes Historical Provider, MD  aspirin EC 81 MG tablet Take 1 tablet (81 mg total) by mouth daily. 03/23/15  Yes Leone Haven, MD  atorvastatin (LIPITOR) 40 MG tablet Take 1 tablet (40 mg total) by mouth daily. 03/23/15  Yes Leone Haven, MD  ibuprofen (ADVIL,MOTRIN) 200 MG tablet Take 400 mg by mouth every 6 (six) hours as needed for headache or mild pain.   Yes Historical Provider, MD  lisinopril-hydrochlorothiazide  (PRINZIDE,ZESTORETIC) 10-12.5 MG tablet Take 1 tablet by mouth daily. 11/22/15  Yes Coral Spikes, DO  Multiple Vitamin (MULTIVITAMIN WITH MINERALS) TABS tablet Take 1 tablet by mouth at bedtime.   Yes Historical Provider, MD  pantoprazole (PROTONIX) 40 MG tablet Take 40 mg by mouth at bedtime.   Yes Historical Provider, MD      VITAL SIGNS:  Blood pressure 126/77, pulse 79, temperature 98.1 F (36.7 C), temperature source Oral, resp. rate 16, height 5\' 6"  (1.676 m), weight 95.255 kg (210 lb), SpO2 99 %.  PHYSICAL EXAMINATION:   Physical Exam  GENERAL:  45 y.o.-year-old Obese patient lying in the bed with no acute distress.  EYES: Pupils equal, round, reactive to light and accommodation. No scleral icterus. Extraocular muscles intact.  HEENT: Head atraumatic, normocephalic. Oropharynx and nasopharynx clear.  NECK:  Supple, no jugular venous distention. No thyroid enlargement, no tenderness.  LUNGS: Normal breath sounds bilaterally, no wheezing, rales,rhonchi or crepitation. No use of accessory muscles of respiration.  CARDIOVASCULAR: S1, S2 normal. No murmurs, rubs, or gallops.  ABDOMEN: Soft, obese, nontender, nondistended. Bowel sounds present. No organomegaly or mass.  EXTREMITIES: No pedal edema, cyanosis, or clubbing. No left shoulder tenderness or swelling on exam. NEUROLOGIC: Cranial nerves II through XII are intact. Muscle strength 5/5 in all extremities. Sensation intact. Gait not checked.  PSYCHIATRIC: The patient is alert and oriented x 3.  SKIN: No obvious rash, lesion, or ulcer.   LABORATORY PANEL:   CBC  Recent Labs Lab 12/06/15 1330  WBC 9.7  HGB 15.0  HCT 44.2  PLT 279   ------------------------------------------------------------------------------------------------------------------  Chemistries   Recent Labs Lab 12/06/15 1330  NA 138  K 2.8*  CL 102  CO2 21*  GLUCOSE 133*  BUN 18  CREATININE 1.43*  CALCIUM 9.4    ------------------------------------------------------------------------------------------------------------------  Cardiac Enzymes  Recent Labs Lab 12/06/15 1330  TROPONINI <0.03   ------------------------------------------------------------------------------------------------------------------  RADIOLOGY:  Dg Chest 2 View  12/06/2015  CLINICAL DATA:  Short of breath.  Neck and shoulder pain EXAM: CHEST  2 VIEW COMPARISON:  None. FINDINGS: The heart size and mediastinal contours are within normal limits. Both lungs are clear. The visualized skeletal structures are unremarkable. IMPRESSION: No active cardiopulmonary disease. Electronically Signed   By: Franchot Gallo M.D.   On: 12/06/2015 15:09    EKG:   Orders placed or performed during the hospital encounter of 12/06/15  . ED EKG  . EKG 12-Lead  . EKG 12-Lead  . ED EKG  . ED EKG  . ED EKG    IMPRESSION AND PLAN:   Majeed Tamashiro  is a 45 y.o. male with a known history of GERD, hypertension, Hyperlipidemia Presents to the hospital secondary to back to back near syncopal episodes today.  #1 Near syncope- could be dehydration and hypokalemia, however have to rule out cardiac causes with his left arm and neck pain - Admit to tele, recycle troponins, myoview in AM - start asa - IV fluids, potassium replacement -  Orthostatics   #2 Hypokalemia- hold his  HCTZ for now IV fluids with potassium and recheck  #3 HTN- on lisinopril  #4 GERD- protonix  #5 DVT Prophylaxis- lovenox    All the records are reviewed and case discussed with ED provider. Management plans discussed with the patient, family and they are in agreement.  CODE STATUS: Full code  TOTAL TIME TAKING CARE OF THIS PATIENT: 50 minutes.    Gladstone Lighter M.D on 12/06/2015 at 4:16 PM  Between 7am to 6pm - Pager - [phone]  After 6pm go to [email or web address] - password [email or web address] [facility]  [facility]  Office  [phone]  CC: Primary care  physician; [name], MD

## 2015-12-06 NOTE — ED Provider Notes (Signed)
[facility] Emergency Department Provider Note   ____________________________________________  Time seen: Approximately 215 PM  I have reviewed the triage vital signs and the nursing notes.   HISTORY  Chief Complaint Dizziness   HPI Wesley Bridges is a 45 y.o. male with a history of hypertension who is presenting to the emergency department today after a near syncopal episode. He said that he was working outside for about 3 hours this morning. He says that he was drinking water while he was working. He says he came inside to eat lunch and while he was eating he became lightheaded. He said while he was lightheaded he also became diaphoretic and experienced a 5 out of 10, aching pain to the left side of his neck and the left side of his jaw. He says that he was also transiently short of breath. He said that he had an episode of this initially which lasted about 5 minutes where he had to lie himself down on the floor. He said then he got back up and started eating again and had another, similar episode but which lasted longer. At this time the emergency departmenthe is still having aching pain to his left shoulder. Denies any chest pain. No history of coronary artery disease. No family history of coronary artery disease. He said that he recently changed his medication 2 weeks ago to lisinopril hydrochlorothiazide. However, he is working outside since then has not had any complications. He also admits to drinking "a few" beers every night. Also reported a mild headache initially which has since resolved. Now says is mildly nauseous.   Past Medical History  Diagnosis Date  . Asthma   . GERD (gastroesophageal reflux disease)   . Chronic headaches   . Hypertension     Patient Active Problem List   Diagnosis Date Noted  . Injury of toe on left foot 10/31/2015  . Swelling of lower extremity 10/31/2015  . Esophageal reflux 10/31/2015  . Numbness 10/31/2015  .  Hyperlipidemia 04/13/2015  . Essential hypertension 03/18/2015  . Headache 03/18/2015  . Chest pain 03/18/2015    Past Surgical History  Procedure Laterality Date  . Ankle fracture surgery    . Ankle fusion    . Femur fracture surgery    . Mandible fracture surgery      Current Outpatient Rx  Name  Route  Sig  Dispense  Refill  . Acetaminophen (TYLENOL PO)   Oral   Take by mouth.         Marland Kitchen aspirin EC 81 MG tablet   Oral   Take 1 tablet (81 mg total) by mouth daily.   90 tablet   3   . atorvastatin (LIPITOR) 40 MG tablet   Oral   Take 1 tablet (40 mg total) by mouth daily.   90 tablet   3   . IBUPROFEN PO   Oral   Take by mouth.         Marland Kitchen lisinopril-hydrochlorothiazide (PRINZIDE,ZESTORETIC) 10-12.5 MG tablet   Oral   Take 1 tablet by mouth daily.   90 tablet   0   . Multiple Vitamin (MULTIVITAMIN) capsule   Oral   Take 1 capsule by mouth daily.         . pantoprazole (PROTONIX) 40 MG tablet   Oral   Take 1 tablet (40 mg total) by mouth daily.   30 tablet   3     Allergies Review of patient's allergies indicates no  known allergies.  Family History  Problem Relation Age of Onset  . Arthritis      parents    Social History Social History  Substance Use Topics  . Smoking status: Former Research scientist (life sciences)  . Smokeless tobacco: None  . Alcohol Use: 3.6 - 4.8 oz/week    6-8 Cans of beer per week    Review of Systems Constitutional: No fever/chills Eyes: No visual changes. ENT: No sore throat. Cardiovascular: Denies chest pain. Respiratory: As above. Gastrointestinal: No abdominal pain.  no vomiting.  No diarrhea.  No constipation. Genitourinary: Negative for dysuria. Musculoskeletal: Negative for back pain. Skin: Negative for rash. Neurological: Negative for focal weakness or numbness.  10-point ROS otherwise negative.  ____________________________________________   PHYSICAL EXAM:  VITAL SIGNS: ED Triage Vitals  Enc Vitals Group     BP  12/06/15 1328 126/77 mmHg     Pulse Rate 12/06/15 1328 79     Resp 12/06/15 1328 16     Temp 12/06/15 1328 98.1 F (36.7 C)     Temp Source 12/06/15 1328 Oral     SpO2 12/06/15 1328 99 %     Weight 12/06/15 1328 210 lb (95.255 kg)     Height 12/06/15 1328 5\' 6"  (1.676 m)     Head Cir --      Peak Flow --      Pain Score 12/06/15 1329 7     Pain Loc --      Pain Edu? --      Excl. in [place]? --    Constitutional: Alert and oriented. Well appearing and in no acute distress. Eyes: Conjunctivae are normal. PERRL. EOMI. Head: Atraumatic. Nose: No congestion/rhinnorhea. Mouth/Throat: Mucous membranes are moist.   Neck: No stridor.  No tenderness along the left trapezius musculature of the rotator cuff. Cardiovascular: Normal rate, regular rhythm. Grossly normal heart sounds.  Good peripheral circulation with bilateral radial pulses. Respiratory: Normal respiratory effort.  No retractions. Lungs CTAB. Gastrointestinal: Soft and nontender. No distention.  Musculoskeletal: No lower extremity tenderness nor edema.  No joint effusions. Neurologic:  Normal speech and language. No gross focal neurologic deficits are appreciated. Skin:  Skin is warm, dry and intact. No rash noted. Psychiatric: Mood and affect are normal. Speech and behavior are normal.  ____________________________________________   LABS (all labs ordered are listed, but only abnormal results are displayed)  Labs Reviewed  BASIC METABOLIC PANEL - Abnormal; Notable for the following:    Potassium 2.8 (*)    CO2 21 (*)    Glucose, Bld 133 (*)    Creatinine, Ser 1.43 (*)    GFR calc non Af Amer 57 (*)    All other components within normal limits  CBC  TROPONIN I  URINALYSIS COMPLETEWITH MICROSCOPIC ([facility] ONLY)   ____________________________________________  EKG  ED ECG REPORT I, Doran Stabler, the attending physician, personally viewed and interpreted this ECG.   Date: 12/06/2015  EKG Time: 1331  Rate: 81   Rhythm: normal sinus rhythm  Axis: Normal axis  Intervals:Normal  ST&T Change: T wave inversions in 3 and aVF. New T-wave inversion in aVF from previous EKG of 2016.  ED ECG REPORT I, Doran Stabler, the attending physician, personally viewed and interpreted this ECG.   Date: 12/06/2015  EKG Time: 1435  Rate: 81  Rhythm: normal sinus rhythm  Axis: Normal axis  Intervals:none  ST&T Change: No ST segment elevation or depression. Persistent T-wave inversion in aVF.   ____________________________________________  RADIOLOGY  DG Chest 2 View (Final result) Result time: 12/06/15 15:09:01   Final result by Rad Results In Interface (12/06/15 15:09:01)   Narrative:   CLINICAL DATA: Short of breath. Neck and shoulder pain  EXAM: CHEST 2 VIEW  COMPARISON: None.  FINDINGS: The heart size and mediastinal contours are within normal limits. Both lungs are clear. The visualized skeletal structures are unremarkable.  IMPRESSION: No active cardiopulmonary disease.   Electronically Signed By: Franchot Gallo M.D. On: 12/06/2015 15:09    ____________________________________________   PROCEDURES  Procedure(s) performed: None  Critical Care performed: No  ____________________________________________   INITIAL IMPRESSION / ASSESSMENT AND PLAN / ED COURSE  Pertinent labs & imaging results that were available during my care of the patient were reviewed by me and considered in my medical decision making (see chart for details).  ----------------------------------------- 3:48 PM on 12/06/2015 -----------------------------------------  Patient without any distress. Resting comfortably on the bed without any signs of pain outwardly. Still says he has mild aching to his left shoulder. Initial troponin is undetectable. Found to have hypokalemia and ordered by mouth potassium. I'm concerned because the patient had a near-syncopal episode with left-sided shoulder  pain going up into his neck. He has risk factors of hypertension. This could just be vasovagal in origin but I'm concerned about cardiac etiologies. I discussed the possibility of staying with the patient as well as his family is understanding and willing to comply. Signed out to Dr. Tressia Miners. ____________________________________________   FINAL CLINICAL IMPRESSION(S) / ED DIAGNOSES  Near syncope.    NEW MEDICATIONS STARTED DURING THIS VISIT:  New Prescriptions   No medications on file     Note:  This document was prepared using Dragon voice recognition software and may include unintentional dictation errors.    Orbie Pyo, MD 12/06/15 [phone]

## 2015-12-06 NOTE — ED Notes (Signed)
Pt reports dizziness today after working outside, reports had an episode of left arm pain; now with left neck and left shoulder pain. Pt's shirt wet with sweat.

## 2015-12-06 NOTE — Progress Notes (Signed)
Per Dr. Anselm Jungling patient may shower, order placed.  Wesley Bridges

## 2015-12-07 ENCOUNTER — Encounter: Payer: Self-pay | Admitting: Radiology

## 2015-12-07 ENCOUNTER — Ambulatory Visit: Payer: 59 | Admitting: Family Medicine

## 2015-12-07 ENCOUNTER — Observation Stay (HOSPITAL_BASED_OUTPATIENT_CLINIC_OR_DEPARTMENT_OTHER): Payer: 59

## 2015-12-07 ENCOUNTER — Telehealth: Payer: Self-pay | Admitting: Family Medicine

## 2015-12-07 DIAGNOSIS — I10 Essential (primary) hypertension: Secondary | ICD-10-CM | POA: Diagnosis not present

## 2015-12-07 DIAGNOSIS — R55 Syncope and collapse: Secondary | ICD-10-CM | POA: Diagnosis not present

## 2015-12-07 DIAGNOSIS — Z6833 Body mass index (BMI) 33.0-33.9, adult: Secondary | ICD-10-CM | POA: Diagnosis not present

## 2015-12-07 DIAGNOSIS — E785 Hyperlipidemia, unspecified: Secondary | ICD-10-CM | POA: Diagnosis not present

## 2015-12-07 DIAGNOSIS — E876 Hypokalemia: Secondary | ICD-10-CM | POA: Diagnosis not present

## 2015-12-07 DIAGNOSIS — K219 Gastro-esophageal reflux disease without esophagitis: Secondary | ICD-10-CM | POA: Diagnosis not present

## 2015-12-07 DIAGNOSIS — Z87891 Personal history of nicotine dependence: Secondary | ICD-10-CM | POA: Diagnosis not present

## 2015-12-07 DIAGNOSIS — J45909 Unspecified asthma, uncomplicated: Secondary | ICD-10-CM | POA: Diagnosis not present

## 2015-12-07 DIAGNOSIS — E669 Obesity, unspecified: Secondary | ICD-10-CM | POA: Diagnosis not present

## 2015-12-07 DIAGNOSIS — R51 Headache: Secondary | ICD-10-CM | POA: Diagnosis not present

## 2015-12-07 LAB — CBC
HCT: 39.7 % — ABNORMAL LOW (ref 40.0–52.0)
HEMOGLOBIN: 13.7 g/dL (ref 13.0–18.0)
MCH: 30.4 pg (ref 26.0–34.0)
MCHC: 34.5 g/dL (ref 32.0–36.0)
MCV: 88.1 fL (ref 80.0–100.0)
PLATELETS: 243 10*3/uL (ref 150–440)
RBC: 4.51 MIL/uL (ref 4.40–5.90)
RDW: 13.7 % (ref 11.5–14.5)
WBC: 7.9 10*3/uL (ref 3.8–10.6)

## 2015-12-07 LAB — NM MYOCAR MULTI W/SPECT W/WALL MOTION / EF
CHL CUP NUCLEAR SDS: 0
CHL CUP NUCLEAR SRS: 0
CHL CUP NUCLEAR SSS: 1
CHL CUP RESTING HR STRESS: 61 {beats}/min
CSEPED: 0 min
CSEPEW: 1 METS
CSEPPHR: 99 {beats}/min
Exercise duration (sec): 0 s
LV dias vol: 119 mL (ref 62–150)
LV sys vol: 51 mL
MPHR: 174 {beats}/min
Percent HR: 56 %
TID: 1.01

## 2015-12-07 LAB — BASIC METABOLIC PANEL
ANION GAP: 7 (ref 5–15)
BUN: 17 mg/dL (ref 6–20)
CALCIUM: 8.9 mg/dL (ref 8.9–10.3)
CO2: 24 mmol/L (ref 22–32)
Chloride: 109 mmol/L (ref 101–111)
Creatinine, Ser: 0.87 mg/dL (ref 0.61–1.24)
GFR calc non Af Amer: >60 mL/min (ref >60)
Glucose, Bld: 115 mg/dL — ABNORMAL HIGH (ref 65–99)
Potassium: 4 mmol/L (ref 3.5–5.1)
SODIUM: 140 mmol/L (ref 135–145)

## 2015-12-07 LAB — TROPONIN I: Troponin I: <0.03 ng/mL (ref <0.031)

## 2015-12-07 MED ORDER — POTASSIUM CHLORIDE ER 20 MEQ PO TBCR: 20.0000 meq | EXTENDED_RELEASE_TABLET | Freq: Every day | ORAL | Status: DC

## 2015-12-07 MED ORDER — TECHNETIUM TC 99M TETROFOSMIN IV KIT
31.0200 | PACK | Freq: Once | INTRAVENOUS | Status: AC | PRN
  Administered 2015-12-07: 31.02 via INTRAVENOUS

## 2015-12-07 MED ORDER — TECHNETIUM TC 99M TETROFOSMIN IV KIT
13.6500 | PACK | Freq: Once | INTRAVENOUS | Status: AC | PRN
  Administered 2015-12-07: 13.65 via INTRAVENOUS

## 2015-12-07 MED ORDER — REGADENOSON 0.4 MG/5ML IV SOLN
0.4000 mg | Freq: Once | INTRAVENOUS | Status: AC
  Administered 2015-12-07: 0.4 mg via INTRAVENOUS

## 2015-12-07 MED ORDER — LISINOPRIL 10 MG PO TABS: 10.0000 mg | ORAL_TABLET | Freq: Every day | ORAL | Status: DC

## 2015-12-07 NOTE — Progress Notes (Signed)
[place], [place].   12/07/2015  Patient: Wesley Bridges   Date of Birth:  05-18-1971  Date of admission:  12/06/2015  Date of Discharge  12/07/2015    To Whom it May Concern:   Dantonio Wayland  may return to work on 12/08/2015.  If you have any questions or concerns, please don't hesitate to call.  Sincerely,   Hillary Bow R M.D Office : [phone]   .

## 2015-12-07 NOTE — Telephone Encounter (Signed)
[name] seems to have been cancelled for Friday. No TCM due today. Please follow up with TCM 12/08/15 and try to reschedule.

## 2015-12-07 NOTE — Discharge Instructions (Signed)

## 2015-12-07 NOTE — Telephone Encounter (Signed)
Will follow as appropriate. 

## 2015-12-07 NOTE — Progress Notes (Signed)
Patient d/c'd home. Education provided, no questions at this time. Patient picked up by wife. Telemetry removed. Wesley Bridges   

## 2015-12-07 NOTE — Telephone Encounter (Signed)
HFU, Pt is being discharged from hospital today. Dx is Syncope. Pt is scheduled for 06/16. Thank you!

## 2015-12-07 NOTE — Progress Notes (Signed)
A&O. Independent. IV fluids infusing. Tylenol given for headache early in the shift. No s/s distress. For stress test this AM.

## 2015-12-08 NOTE — Telephone Encounter (Signed)
Transition Care Management Follow-up Telephone Call  How have you been since you were released from the hospital? Okay, headache only  Do you understand why you were in the hospital? Potassium was very low, blood pressure meds made it low.    Do you understand the discharge instrcutions? yes  Items Reviewed:  Medications reviewed: yes, BP medication was changed  Allergies reviewed: yes  Dietary changes reviewed: no  Referrals reviewed: no   Functional Questionnaire:  Activities of Daily Living (ADLs):   He states they are independent in the following: independent.   States they require assistance with the following: no  Any transportation issues/concerns?: not able to make the appt that was scheduled for Friday due to work, he tried to call this am to make an appt today, there where none available.    Any patient concerns? no   Confirmed importance and date/time of follow-up visits scheduled: he will call back to schedule the appt as he can't confirm a date that will work with me.  Thanks   Confirmed with patient if condition begins to worsen call PCP or go to the ER.  Patient was given the Call-a-Nurse line [phone]:

## 2015-12-08 NOTE — Telephone Encounter (Signed)
Could you please get the patient rescheduled for hospital follow-up? Thanks.

## 2015-12-09 ENCOUNTER — Ambulatory Visit: Payer: 59 | Admitting: Family Medicine

## 2015-12-09 NOTE — Telephone Encounter (Signed)
Patient was going to return a call to the office after he went back to work today to schedule at the beginning of next week, he refused to schedule an appt I offered until he checked with his work.

## 2015-12-09 NOTE — Telephone Encounter (Signed)
Noted  

## 2015-12-10 NOTE — Discharge Summary (Signed)
[place] [facility] [place] NAME: Wesley Bridges    MR#:  [id number]  DATE OF BIRTH:  1971/04/21  DATE OF ADMISSION:  12/06/2015 ADMITTING PHYSICIAN: Gladstone Lighter, MD  DATE OF DISCHARGE: 12/07/2015  3:13 PM  PRIMARY CARE PHYSICIAN: [name], MD   ADMISSION DIAGNOSIS:  Syncope [R55] Near syncope [R55]  DISCHARGE DIAGNOSIS:  Active Problems:   Syncope   SECONDARY DIAGNOSIS:   Past Medical History  Diagnosis Date  . Asthma   . GERD (gastroesophageal reflux disease)   . Chronic headaches   . Hypertension   . Hyperlipidemia      ADMITTING HISTORY  Wesley Bridges is a 45 y.o. male with a known history of GERD, hypertension, Hyperlipidemia Presents to the hospital secondary to back to back near syncopal episodes today. Patient denies any prior cardiac history. No family history of any coronary artery disease. He is obese, has hypertension and hyperlipidemia and a former smoker. He was out working in the yard this afternoon, came back to eat his lunch sat down and felt lightheaded and laid on the floor to let it pass over. Once he felt better after a few seconds he sat back up again and had similar episode very actively back up again. This time it was also associated with nausea, diaphoresis and he felt nagging pain in his left shoulder and also left side of the neck and jaw. Denies any chest pain. He also had transient episode of shortness of breath as he was talking to his wife at the time. Presented to the emergency room. No EKG changes first troponin is negative. Labs indicate mild dehydration and hypokalemia.   HOSPITAL COURSE:    Wesley Bridges is a 45 y.o. male with a known history of GERD, hypertension, Hyperlipidemia Presents to the hospital secondary to back to back near syncopal episodes today.  #1 Near syncope- could be dehydration and hypokalemia, however ruled out cardiac causes with his left arm and neck  pain Monitor on telemetry. No arrhythmias. Troponin checked 3 times was normal. Stress test showed no reversible ischemia. Was thought to be low risk study by cardiology.  #2 Hypokalemia- hydrochlorothiazide held. Patient was on lisinopril and hydrochlorothiazide combination which was started recently. At discharge she was placed on potassium 20 mg daily. Also hydrochlorothiazide stopped and just continued on lisinopril. Will need repeat electrolytes study with his PCP in 1 week.  #3 HTN- on lisinopril  #4 GERD- protonix  #5 DVT Prophylaxis- lovenox  Patient stable at time of discharge.  CONSULTS OBTAINED:     DRUG ALLERGIES:  No Known Allergies  DISCHARGE MEDICATIONS:   Discharge Medication List as of 12/07/2015  3:05 PM    START taking these medications   Details  lisinopril (PRINIVIL,ZESTRIL) 10 MG tablet Take 1 tablet (10 mg total) by mouth daily., Starting 12/07/2015, Until Discontinued, Normal    potassium chloride 20 MEQ TBCR Take 20 mEq by mouth daily., Starting 12/07/2015, Until Discontinued, Normal      CONTINUE these medications which have NOT CHANGED   Details  acetaminophen (TYLENOL) 500 MG tablet Take 1,000 mg by mouth every 6 (six) hours as needed for mild pain or headache., Until Discontinued, Historical Med    aspirin EC 81 MG tablet Take 1 tablet (81 mg total) by mouth daily., Starting 03/23/2015, Until Discontinued, Normal    atorvastatin (LIPITOR) 40 MG tablet Take 1 tablet (40 mg total) by mouth daily., Starting 03/23/2015, Until Discontinued, Normal  ibuprofen (ADVIL,MOTRIN) 200 MG tablet Take 400 mg by mouth every 6 (six) hours as needed for headache or mild pain., Until Discontinued, Historical Med    Multiple Vitamin (MULTIVITAMIN WITH MINERALS) TABS tablet Take 1 tablet by mouth at bedtime., Until Discontinued, Historical Med    pantoprazole (PROTONIX) 40 MG tablet Take 40 mg by mouth at bedtime., Until Discontinued, Historical Med      STOP  taking these medications     lisinopril-hydrochlorothiazide (PRINZIDE,ZESTORETIC) 10-12.5 MG tablet         Today   VITAL SIGNS:  Blood pressure 111/79, pulse 58, temperature 97.8 F (36.6 C), temperature source Oral, resp. rate 18, height 5\' 6"  (1.676 m), weight 95.255 kg (210 lb), SpO2 99 %.  I/O:  No intake or output data in the 24 hours ending 12/10/15 1312  PHYSICAL EXAMINATION:  Physical Exam  GENERAL:  44 y.o.-year-old patient lying in the bed with no acute distress.  LUNGS: Normal breath sounds bilaterally, no wheezing, rales,rhonchi or crepitation. No use of accessory muscles of respiration.  CARDIOVASCULAR: S1, S2 normal. No murmurs, rubs, or gallops.  ABDOMEN: Soft, non-tender, non-distended. Bowel sounds present. No organomegaly or mass.  NEUROLOGIC: Moves all 4 extremities. PSYCHIATRIC: The patient is alert and oriented x 3.  SKIN: No obvious rash, lesion, or ulcer.   DATA REVIEW:   CBC  Recent Labs Lab 12/07/15 0652  WBC 7.9  HGB 13.7  HCT 39.7*  PLT 243    Chemistries   Recent Labs Lab 12/07/15 0652  NA 140  K 4.0  CL 109  CO2 24  GLUCOSE 115*  BUN 17  CREATININE 0.87  CALCIUM 8.9    Cardiac Enzymes  Recent Labs Lab 12/07/15 0652  TROPONINI <0.03    Microbiology Results  No results found for this or any previous visit.  RADIOLOGY:  No results found.  Follow up with PCP in 1 week.  Management plans discussed with the patient, family and they are in agreement.  CODE STATUS:  Code Status History    Date Active Date Inactive Code Status Order ID Comments User Context   12/06/2015  6:49 PM 12/07/2015  6:39 PM Full Code [id number]  Gladstone Lighter, MD Inpatient      TOTAL TIME TAKING CARE OF THIS PATIENT ON DAY OF DISCHARGE: more than 30 minutes.   Hillary Bow R M.D on 12/10/2015 at 1:12 PM  Between 7am to 6pm - Pager - [phone]  After 6pm go to [email or web address] - password [email or web address] [facility]   Office  [phone]  CC: Primary care physician; [name], MD  Note: This dictation was prepared with Dragon dictation along with smaller phrase technology. Any transcriptional errors that result from this process are unintentional.

## 2015-12-12 ENCOUNTER — Telehealth: Payer: Self-pay | Admitting: Surgical

## 2015-12-12 NOTE — Telephone Encounter (Signed)
Error

## 2015-12-15 ENCOUNTER — Encounter: Payer: Self-pay | Admitting: Family Medicine

## 2015-12-15 ENCOUNTER — Ambulatory Visit (INDEPENDENT_AMBULATORY_CARE_PROVIDER_SITE_OTHER): Payer: 59 | Admitting: Family Medicine

## 2015-12-15 VITALS — BP 123/80 | HR 70 | Temp 98.3°F | Wt 211.6 lb

## 2015-12-15 DIAGNOSIS — E876 Hypokalemia: Secondary | ICD-10-CM

## 2015-12-15 DIAGNOSIS — R42 Dizziness and giddiness: Secondary | ICD-10-CM

## 2015-12-15 LAB — BASIC METABOLIC PANEL
BUN: 14 mg/dL (ref 6–23)
CALCIUM: 9.8 mg/dL (ref 8.4–10.5)
CO2: 32 meq/L (ref 19–32)
CREATININE: 0.88 mg/dL (ref 0.40–1.50)
Chloride: 102 mEq/L (ref 96–112)
GFR: 98.94 mL/min (ref >60.00)
GLUCOSE: 99 mg/dL (ref 70–99)
Potassium: 3.7 mEq/L (ref 3.5–5.1)
Sodium: 139 mEq/L (ref 135–145)

## 2015-12-15 MED ORDER — LISINOPRIL 10 MG PO TABS: 5.0000 mg | ORAL_TABLET | Freq: Every day | ORAL | Status: DC

## 2015-12-15 NOTE — Progress Notes (Signed)
Patient ID: Wesley Bridges, male   DOB: Apr 12, 1971, 45 y.o.   MRN: [id number]  [name], MD Phone: [phone]  Wesley Bridges is a 45 y.o. male who presents today for follow-up.  Patient was recently hospitalized for lightheadedness. He had transient shortness of breath at that time and had some left shoulder and neck pain with it though no chest pain. He had a negative cardiac workup with negative troponins and a low risk stress test. He reports no additional shortness of breath. Shoulder and neck have had no pain. No chest pain. He does note some minimal lightheadedness that lasts for a split second with quick movements. Always when he is standing up. He was also found to have low potassium. They stopped his HCTZ and place on potassium supplements. They continued his lisinopril. He otherwise feels well at this time.  PMH: Former smoker   ROS see history of present illness  Objective  Physical Exam Filed Vitals:   12/15/15 1031  BP: 123/80  Pulse: 70  Temp: 98.3 F (36.8 C)    BP Readings from Last 3 Encounters:  12/15/15 123/80  12/07/15 111/79  11/22/15 158/114   Wt Readings from Last 3 Encounters:  12/15/15 211 lb 9.6 oz (95.981 kg)  12/06/15 210 lb (95.255 kg)  11/22/15 219 lb 2 oz (99.394 kg)   Laying blood pressure 128/82 pulse 59 Sitting blood pressure 110/80 pulse 63 Standing blood pressure 104/80 pulse 74  Physical Exam  Constitutional: He is well-developed, well-nourished, and in no distress.  HENT:  Head: Normocephalic and atraumatic.  Right Ear: External ear normal.  Left Ear: External ear normal.  Eyes: Conjunctivae are normal. Pupils are equal, round, and reactive to light.  Cardiovascular: Normal rate, regular rhythm and normal heart sounds.   Pulmonary/Chest: Effort normal and breath sounds normal.  Musculoskeletal: He exhibits no edema.  Neurological: He is alert. Gait normal.  Skin: Skin is warm and dry. He is not diaphoretic.       Assessment/Plan: Please see individual problem list.  Lightheadedness Suspect patient's symptoms that required hospitalization were related to dehydration and orthostasis related to blood pressure medications. He had negative cardiac workup. Lightheadedness has been improving though still mildly present. He is orthostatic today. We will decrease his lisinopril to 5 mg daily. He'll monitor his blood pressure daily at home. We will check a BMP today to evaluate his potassium. If normal we will stop his potassium supplement. Advised on staying well-hydrated. He'll continue to monitor. He is given return precautions.    Orders Placed This Encounter  Procedures  . Basic Metabolic Panel (BMET)    Meds ordered this encounter  Medications  . lisinopril (PRINIVIL,ZESTRIL) 10 MG tablet    Sig: Take 0.5 tablets (5 mg total) by mouth daily.    Dispense:  45 tablet    Refill:  [place], MD [place]

## 2015-12-15 NOTE — Progress Notes (Signed)
Pre visit review using our clinic review tool, if applicable. No additional management support is needed unless otherwise documented below in the visit note. 

## 2015-12-15 NOTE — Patient Instructions (Addendum)
Nice to see you. I am glad you are doing better. We will recheck your potassium today and kidney function. Please stay well hydrated. We will decrease your lisinopril dose to 5 mg daily. He is continue to monitor your blood pressure. If you develop chest pain, shortness of breath, persistent lightheadedness, or any new or changing symptoms please seek medical attention.

## 2015-12-15 NOTE — Assessment & Plan Note (Signed)
Suspect patient's symptoms that required hospitalization were related to dehydration and orthostasis related to blood pressure medications. He had negative cardiac workup. Lightheadedness has been improving though still mildly present. He is orthostatic today. We will decrease his lisinopril to 5 mg daily. He'll monitor his blood pressure daily at home. We will check a BMP today to evaluate his potassium. If normal we will stop his potassium supplement. Advised on staying well-hydrated. He'll continue to monitor. He is given return precautions.

## 2016-01-09 ENCOUNTER — Ambulatory Visit: Payer: 59 | Admitting: Neurology

## 2016-01-18 ENCOUNTER — Ambulatory Visit: Payer: 59 | Admitting: Family Medicine

## 2016-03-22 ENCOUNTER — Encounter: Payer: Self-pay | Admitting: Family Medicine

## 2016-03-22 ENCOUNTER — Ambulatory Visit (INDEPENDENT_AMBULATORY_CARE_PROVIDER_SITE_OTHER): Payer: 59 | Admitting: Family Medicine

## 2016-03-22 VITALS — BP 118/84 | HR 77 | Temp 98.3°F

## 2016-03-22 DIAGNOSIS — Z23 Encounter for immunization: Secondary | ICD-10-CM | POA: Diagnosis not present

## 2016-03-22 DIAGNOSIS — K219 Gastro-esophageal reflux disease without esophagitis: Secondary | ICD-10-CM

## 2016-03-22 DIAGNOSIS — I10 Essential (primary) hypertension: Secondary | ICD-10-CM | POA: Diagnosis not present

## 2016-03-22 DIAGNOSIS — N400 Enlarged prostate without lower urinary tract symptoms: Secondary | ICD-10-CM

## 2016-03-22 MED ORDER — PANTOPRAZOLE SODIUM 40 MG PO TBEC: 40.0000 mg | DELAYED_RELEASE_TABLET | Freq: Every day | ORAL | 1 refills | Status: DC

## 2016-03-22 NOTE — Progress Notes (Signed)
  [name], MD Phone: [phone]  Wesley Bridges is a 45 y.o. male who presents today for follow-up.  HYPERTENSION  Disease Monitoring  Home BP Monitoring reports is close to normal Chest pain- no    Dyspnea- no Medications  Compliance-  taking lisinopril.  Edema- no  Patient additionally notes issues with feeling as though his bladder is incompletely emptied after urinating. Does not happen every time. No stopping or starting. No urgency or frequency. No nocturia. No dysuria. Reports his father had BPH and recently had surgery for this. No history of prostate cancer in his family.  GERD: Notes this was very well controlled on Protonix. Has run out of Protonix and started back on Prilosec. Has had recurrence of his symptoms of burning and sour taste. Has a history of a hernia diagnosed 15 years ago. No abdominal pain or blood in his stool   PMH: Former smoker   ROS see history of present illness  Objective  Physical Exam Vitals:   03/22/16 1551  BP: 118/84  Pulse: 77  Temp: 98.3 F (36.8 C)    BP Readings from Last 3 Encounters:  03/22/16 118/84  12/15/15 123/80  12/07/15 111/79   Wt Readings from Last 3 Encounters:  12/15/15 211 lb 9.6 oz (96 kg)  12/06/15 210 lb (95.3 kg)  11/22/15 219 lb 2 oz (99.4 kg)    Physical Exam  Constitutional: He is well-developed, well-nourished, and in no distress.  Cardiovascular: Normal rate, regular rhythm and normal heart sounds.   Pulmonary/Chest: Effort normal and breath sounds normal.  Abdominal: Soft. Bowel sounds are normal. He exhibits no distension. There is no tenderness. There is no rebound and no guarding.  Genitourinary: Rectum normal.  Genitourinary Comments: Slightly enlarged prostate, no nodules  Neurological: He is alert. Gait normal.  Skin: Skin is warm and dry.     Assessment/Plan: Please see individual problem list.  Essential hypertension At goal. Continue lisinopril.  Esophageal  reflux Persistent after coming off Protonix. We'll refer to GI.  Enlarged prostate Slight enlargement of the prostate. Likely BPH leading to his urinary symptoms. We'll check a PSA today and if normal could consider Flomax. If elevated will refer to urology.   Orders Placed This Encounter  Procedures  . PSA  . Ambulatory referral to Gastroenterology    Referral Priority:   Routine    Referral Type:   Consultation    Referral Reason:   Specialty Services Required    Number of Visits Requested:   1    Meds ordered this encounter  Medications  . pantoprazole (PROTONIX) 40 MG tablet    Sig: Take 1 tablet (40 mg total) by mouth at bedtime.    Dispense:  90 tablet    Refill:  1    [name], MD [place]

## 2016-03-22 NOTE — Assessment & Plan Note (Signed)
Slight enlargement of the prostate. Likely BPH leading to his urinary symptoms. We'll check a PSA today and if normal could consider Flomax. If elevated will refer to urology.

## 2016-03-22 NOTE — Patient Instructions (Signed)
Nice to see you. We will check a PSA for your prostate. We'll refer you to GI for your reflux. Please continue your lisinopril.

## 2016-03-22 NOTE — Assessment & Plan Note (Signed)
At goal. Continue lisinopril.  

## 2016-03-22 NOTE — Assessment & Plan Note (Signed)
Persistent after coming off Protonix. We'll refer to GI.

## 2016-03-23 ENCOUNTER — Other Ambulatory Visit: Payer: Self-pay | Admitting: Family Medicine

## 2016-03-23 LAB — PSA: PSA: 1.12 ng/mL (ref 0.10–4.00)

## 2016-03-23 MED ORDER — TAMSULOSIN HCL 0.4 MG PO CAPS: 0.4000 mg | ORAL_CAPSULE | Freq: Every day | ORAL | 3 refills | Status: DC

## 2016-04-07 DIAGNOSIS — I10 Essential (primary) hypertension: Secondary | ICD-10-CM | POA: Diagnosis not present

## 2016-04-07 DIAGNOSIS — J0140 Acute pansinusitis, unspecified: Secondary | ICD-10-CM | POA: Diagnosis not present

## 2016-04-09 ENCOUNTER — Other Ambulatory Visit: Payer: Self-pay | Admitting: Family Medicine

## 2016-04-18 ENCOUNTER — Ambulatory Visit (INDEPENDENT_AMBULATORY_CARE_PROVIDER_SITE_OTHER): Payer: 59 | Admitting: Family Medicine

## 2016-04-18 ENCOUNTER — Encounter: Payer: Self-pay | Admitting: Family Medicine

## 2016-04-18 DIAGNOSIS — J069 Acute upper respiratory infection, unspecified: Secondary | ICD-10-CM | POA: Diagnosis not present

## 2016-04-18 DIAGNOSIS — M2550 Pain in unspecified joint: Secondary | ICD-10-CM

## 2016-04-18 MED ORDER — LORATADINE 10 MG PO TABS: 10.0000 mg | ORAL_TABLET | Freq: Every day | ORAL | 11 refills | Status: DC

## 2016-04-18 MED ORDER — MELOXICAM 7.5 MG PO TABS: 7.5000 mg | ORAL_TABLET | Freq: Every day | ORAL | 0 refills | Status: DC

## 2016-04-18 MED ORDER — FLUTICASONE PROPIONATE 50 MCG/ACT NA SUSP: 2.0000 | Freq: Every day | NASAL | 6 refills | Status: DC

## 2016-04-18 NOTE — Assessment & Plan Note (Signed)
Patient with recent sinusitis treated with antibiotic. He has had improvement in his discomfort and congestion. Still having some postnasal drip and rhinorrhea. Doubt that he still has a bacterial sinus infection given significant improvement in symptoms. We will treat with Flonase and Claritin. He'll trial this for the next week and if not improving could consider repeat treatment with antibiotics. Given return precautions.

## 2016-04-18 NOTE — Progress Notes (Signed)
[name], MD Phone: [phone]  Wesley Bridges is a 45 y.o. male who presents today for follow-up.  Patient notes he was seen at an urgent care and diagnosed with sinusitis. He was treated with antibiotic. Notes he had had right-sided facial pain in his frontal and maxillary sinuses. Also had ear pain. Notes the discomfort and congestion have resolved. Minimal congestion now. Does note some postnasal drip. Still some rhinorrhea. No fevers. Symptoms have overall improved. Not taking any medications for this at this time.  Patient also notes for some time he has had stiffness and discomfort in the joints in his hands and wrists and knees when getting up in the morning. Takes several hours to improve. He is without discomfort or stiffness throughout the day though. It occurs most mornings. Not taking anything for this. No swelling in the joints.  PMH: Former smoker   ROS see history of present illness  Objective  Physical Exam Vitals:   04/18/16 1553  BP: 120/84  Pulse: 72  Temp: 98.2 F (36.8 C)    BP Readings from Last 3 Encounters:  04/18/16 120/84  03/22/16 118/84  12/15/15 123/80   Wt Readings from Last 3 Encounters:  04/18/16 218 lb 3.2 oz (99 kg)  12/15/15 211 lb 9.6 oz (96 kg)  12/06/15 210 lb (95.3 kg)    Physical Exam  Constitutional: No distress.  HENT:  Head: Normocephalic and atraumatic.  Mouth/Throat: Oropharynx is clear and moist. No oropharyngeal exudate.  Normal TMs bilaterally, no sinus tenderness to percussion  Eyes: Conjunctivae are normal. Pupils are equal, round, and reactive to light.  Neck: Neck supple.  Cardiovascular: Normal rate, regular rhythm and normal heart sounds.   Pulmonary/Chest: Effort normal and breath sounds normal.  Musculoskeletal:  Bilateral hands, wrists, and knees with no joint tenderness, joint swelling, joint warmth, or joint erythema, bilateral knees with no ligamentous laxity, negative McMurray's    Lymphadenopathy:    He has no cervical adenopathy.  Neurological: He is alert. Gait normal.  Skin: Skin is warm and dry. He is not diaphoretic.     Assessment/Plan: Please see individual problem list.  Acute upper respiratory infection Patient with recent sinusitis treated with antibiotic. He has had improvement in his discomfort and congestion. Still having some postnasal drip and rhinorrhea. Doubt that he still has a bacterial sinus infection given significant improvement in symptoms. We will treat with Flonase and Claritin. He'll trial this for the next week and if not improving could consider repeat treatment with antibiotics. Given return precautions.  Joint pain Patient with pain in the joints of his hands and knees with accompanying stiffness. Suspect arthritis. Benign exam today. We will trial on meloxicam. If not improving he will let us know and we could consider imaging.   No orders of the defined types were placed in this encounter.   Meds ordered this encounter  Medications  . meloxicam (MOBIC) 7.5 MG tablet    Sig: Take 1 tablet (7.5 mg total) by mouth daily.    Dispense:  30 tablet    Refill:  0  . fluticasone (FLONASE) 50 MCG/ACT nasal spray    Sig: Place 2 sprays into both nostrils daily.    Dispense:  16 g    Refill:  6  . loratadine (CLARITIN) 10 MG tablet    Sig: Take 1 tablet (10 mg total) by mouth daily.    Dispense:  30 tablet    Refill:  11    [name], MD  [facility]

## 2016-04-18 NOTE — Patient Instructions (Signed)
Nice to see you. We are going to try Flonase and Claritin for your upper respiratory symptoms. If you do not improve over the next week with this please let us know and we will send in doxycycline for you to take. You can take meloxicam for your joint stiffness and pain. Do not take ibuprofen or Aleve while on this medication. If you develop fevers, cough productive of blood, or any new or changing symptoms please seek medical attention medially.

## 2016-04-18 NOTE — Assessment & Plan Note (Signed)
Patient with pain in the joints of his hands and knees with accompanying stiffness. Suspect arthritis. Benign exam today. We will trial on meloxicam. If not improving he will let us know and we could consider imaging.

## 2016-04-18 NOTE — Progress Notes (Signed)
Pre visit review using our clinic review tool, if applicable. No additional management support is needed unless otherwise documented below in the visit note. 

## 2016-05-03 ENCOUNTER — Encounter: Payer: Self-pay | Admitting: Emergency Medicine

## 2016-05-03 ENCOUNTER — Emergency Department
Admission: EM | Admit: 2016-05-03 | Discharge: 2016-05-03 | Disposition: A | Discharge status: Home / Self Care | Payer: 59 | Attending: Emergency Medicine | Admitting: Emergency Medicine

## 2016-05-03 ENCOUNTER — Emergency Department: Payer: 59

## 2016-05-03 DIAGNOSIS — R110 Nausea: Secondary | ICD-10-CM | POA: Insufficient documentation

## 2016-05-03 DIAGNOSIS — R1084 Generalized abdominal pain: Secondary | ICD-10-CM | POA: Diagnosis not present

## 2016-05-03 DIAGNOSIS — R1013 Epigastric pain: Secondary | ICD-10-CM | POA: Diagnosis not present

## 2016-05-03 DIAGNOSIS — J45909 Unspecified asthma, uncomplicated: Secondary | ICD-10-CM | POA: Insufficient documentation

## 2016-05-03 DIAGNOSIS — Z7982 Long term (current) use of aspirin: Secondary | ICD-10-CM | POA: Diagnosis not present

## 2016-05-03 DIAGNOSIS — R1031 Right lower quadrant pain: Secondary | ICD-10-CM | POA: Diagnosis not present

## 2016-05-03 DIAGNOSIS — I10 Essential (primary) hypertension: Secondary | ICD-10-CM | POA: Insufficient documentation

## 2016-05-03 DIAGNOSIS — Z87891 Personal history of nicotine dependence: Secondary | ICD-10-CM | POA: Diagnosis not present

## 2016-05-03 DIAGNOSIS — Z79899 Other long term (current) drug therapy: Secondary | ICD-10-CM | POA: Insufficient documentation

## 2016-05-03 LAB — URINALYSIS COMPLETE WITH MICROSCOPIC (ARMC ONLY)
BILIRUBIN URINE: NEGATIVE
Bacteria, UA: NONE SEEN
Glucose, UA: NEGATIVE mg/dL
Hgb urine dipstick: NEGATIVE
KETONES UR: NEGATIVE mg/dL
LEUKOCYTES UA: NEGATIVE
Nitrite: NEGATIVE
PH: 7 (ref 5.0–8.0)
Protein, ur: NEGATIVE mg/dL
SQUAMOUS EPITHELIAL / LPF: NONE SEEN
Specific Gravity, Urine: 1.004 — ABNORMAL LOW (ref 1.005–1.030)

## 2016-05-03 LAB — CBC
HCT: 45.3 % (ref 40.0–52.0)
Hemoglobin: 15.8 g/dL (ref 13.0–18.0)
MCH: 30.9 pg (ref 26.0–34.0)
MCHC: 34.9 g/dL (ref 32.0–36.0)
MCV: 88.6 fL (ref 80.0–100.0)
PLATELETS: 272 10*3/uL (ref 150–440)
RBC: 5.11 MIL/uL (ref 4.40–5.90)
RDW: 13.7 % (ref 11.5–14.5)
WBC: 8.4 10*3/uL (ref 3.8–10.6)

## 2016-05-03 LAB — COMPREHENSIVE METABOLIC PANEL
ALK PHOS: 76 U/L (ref 38–126)
ALT: 45 U/L (ref 17–63)
AST: 28 U/L (ref 15–41)
Albumin: 4 g/dL (ref 3.5–5.0)
Anion gap: 6 (ref 5–15)
BILIRUBIN TOTAL: 0.2 mg/dL — AB (ref 0.3–1.2)
BUN: 8 mg/dL (ref 6–20)
CHLORIDE: 105 mmol/L (ref 101–111)
CO2: 27 mmol/L (ref 22–32)
CREATININE: 0.74 mg/dL (ref 0.61–1.24)
Calcium: 9.2 mg/dL (ref 8.9–10.3)
GFR calc Af Amer: >60 mL/min (ref >60)
Glucose, Bld: 107 mg/dL — ABNORMAL HIGH (ref 65–99)
Potassium: 3.7 mmol/L (ref 3.5–5.1)
Sodium: 138 mmol/L (ref 135–145)
TOTAL PROTEIN: 7.9 g/dL (ref 6.5–8.1)

## 2016-05-03 LAB — LIPASE, BLOOD: Lipase: 24 U/L (ref 11–51)

## 2016-05-03 LAB — TROPONIN I: Troponin I: <0.03 ng/mL (ref <0.03)

## 2016-05-03 MED ORDER — SODIUM CHLORIDE 0.9 % IV BOLUS (SEPSIS)
1000.0000 mL | Freq: Once | INTRAVENOUS | Status: AC
  Administered 2016-05-03: 1000 mL via INTRAVENOUS

## 2016-05-03 MED ORDER — IOPAMIDOL (ISOVUE-300) INJECTION 61%
100.0000 mL | Freq: Once | INTRAVENOUS | Status: AC | PRN
  Administered 2016-05-03: 100 mL via INTRAVENOUS

## 2016-05-03 MED ORDER — IOPAMIDOL (ISOVUE-300) INJECTION 61%
30.0000 mL | Freq: Once | INTRAVENOUS | Status: AC
  Administered 2016-05-03: 30 mL via ORAL

## 2016-05-03 MED ORDER — SUCRALFATE 1 G PO TABS: 1.0000 g | ORAL_TABLET | Freq: Four times a day (QID) | ORAL | 0 refills | Status: DC

## 2016-05-03 MED ORDER — ONDANSETRON HCL 4 MG/2ML IJ SOLN
4.0000 mg | Freq: Once | INTRAMUSCULAR | Status: AC
  Administered 2016-05-03: 4 mg via INTRAVENOUS
  Filled 2016-05-03: qty 2

## 2016-05-03 MED ORDER — ONDANSETRON HCL 4 MG PO TABS: 4.0000 mg | ORAL_TABLET | Freq: Every day | ORAL | 0 refills | Status: DC | PRN

## 2016-05-03 MED ORDER — MORPHINE SULFATE (PF) 4 MG/ML IV SOLN
4.0000 mg | Freq: Once | INTRAVENOUS | Status: AC
  Administered 2016-05-03: 4 mg via INTRAVENOUS
  Filled 2016-05-03: qty 1

## 2016-05-03 NOTE — ED Triage Notes (Signed)
Hx of acid reflux, pt complaining of mid abd pain x4 days with occasional nausea

## 2016-05-03 NOTE — ED Notes (Signed)
AAOx3.  Skin warm and dry.  Ambulates with easy and steady gait. NAD 

## 2016-05-03 NOTE — ED Provider Notes (Signed)
[facility] Emergency Department Provider Note  ____________________________________________   First MD Initiated Contact with Patient 05/03/16 [phone]     (approximate)  I have reviewed the triage vital signs and the nursing notes.   HISTORY  Chief Complaint Abdominal Pain   HPI Wesley Bridges is a 45 y.o. male with a history of hypertension as well as hyperlipidemia who is presenting emergency department with epigastric abdominal pain. He also has a history of GERD. He says the pain has been ongoing since this past Monday and feels like a stabbing pain. He says he has had nausea but no vomiting. He says the pain is an 8 out of 10 but nonradiating. Denies any burning with urination.   Past Medical History:  Diagnosis Date  . Asthma   . Chronic headaches   . GERD (gastroesophageal reflux disease)   . Hyperlipidemia   . Hypertension     Patient Active Problem List   Diagnosis Date Noted  . Acute upper respiratory infection 04/18/2016  . Joint pain 04/18/2016  . Enlarged prostate 03/22/2016  . Lightheadedness 12/15/2015  . Syncope 12/06/2015  . Injury of toe on left foot 10/31/2015  . Swelling of lower extremity 10/31/2015  . Esophageal reflux 10/31/2015  . Numbness 10/31/2015  . Hyperlipidemia 04/13/2015  . Essential hypertension 03/18/2015  . Headache 03/18/2015  . Chest pain 03/18/2015    Past Surgical History:  Procedure Laterality Date  . ANKLE FRACTURE SURGERY    . ANKLE FUSION    . FEMUR FRACTURE SURGERY    . MANDIBLE FRACTURE SURGERY      Prior to Admission medications   Medication Sig Start Date End Date Taking? Authorizing Provider  acetaminophen (TYLENOL) 500 MG tablet Take 1,000 mg by mouth every 6 (six) hours as needed for mild pain or headache.    Historical Provider, MD  aspirin EC 81 MG tablet Take 1 tablet (81 mg total) by mouth daily. 03/23/15   Leone Haven, MD  atorvastatin (LIPITOR) 40 MG tablet TAKE 1  TABLET BY MOUTH ONCE DAILY 04/09/16   Leone Haven, MD  fluticasone [facility]) 50 MCG/ACT nasal spray Place 2 sprays into both nostrils daily. 04/18/16   Leone Haven, MD  lisinopril (PRINIVIL,ZESTRIL) 10 MG tablet Take 0.5 tablets (5 mg total) by mouth daily. 12/15/15   Leone Haven, MD  loratadine (CLARITIN) 10 MG tablet Take 1 tablet (10 mg total) by mouth daily. 04/18/16   Leone Haven, MD  meloxicam (MOBIC) 7.5 MG tablet Take 1 tablet (7.5 mg total) by mouth daily. 04/18/16   Leone Haven, MD  Multiple Vitamin (MULTIVITAMIN WITH MINERALS) TABS tablet Take 1 tablet by mouth at bedtime.    Historical Provider, MD  pantoprazole (PROTONIX) 40 MG tablet Take 1 tablet (40 mg total) by mouth at bedtime. 03/22/16   Leone Haven, MD  potassium chloride 20 MEQ TBCR Take 20 mEq by mouth daily. 12/07/15   Hillary Bow, MD  tamsulosin (FLOMAX) 0.4 MG CAPS capsule Take 1 capsule (0.4 mg total) by mouth daily. 03/23/16   Leone Haven, MD    Allergies Patient has no known allergies.  Family History  Problem Relation Age of Onset  . Arthritis      parents  . Hypertension Mother     Social History Social History  Substance Use Topics  . Smoking status: Former Research scientist (life sciences)  . Smokeless tobacco: Never Used  . Alcohol use 3.6 - 4.8 oz/week  6 - 8 Cans of beer per week     Comment: Beers 2-3/night    Review of Systems Constitutional: No fever/chills Eyes: No visual changes. ENT: No sore throat. Cardiovascular: Denies chest pain. Respiratory: Denies shortness of breath. Gastrointestinal:  no vomiting.  No diarrhea.  No constipation. Genitourinary: Negative for dysuria. Musculoskeletal: Negative for back pain. Skin: Negative for rash. Neurological: Negative for headaches, focal weakness or numbness.  10-point ROS otherwise negative.  ____________________________________________   PHYSICAL EXAM:  VITAL SIGNS: ED Triage Vitals  Enc Vitals Group     BP  05/03/16 0829 (!) 179/107     Pulse Rate 05/03/16 0829 (!) 58     Resp 05/03/16 0829 18     Temp 05/03/16 0829 98.7 F (37.1 C)     Temp Source 05/03/16 0829 Oral     SpO2 05/03/16 0829 98 %     Weight 05/03/16 0830 210 lb (95.3 kg)     Height 05/03/16 0830 5\' 7"  (1.702 m)     Head Circumference --      Peak Flow --      Pain Score 05/03/16 0830 7     Pain Loc --      Pain Edu? --      Excl. in [place]? --     Constitutional: Alert and oriented. Well appearing and in no acute distress. Eyes: Conjunctivae are normal. PERRL. EOMI. Head: Atraumatic. Nose: No congestion/rhinnorhea. Mouth/Throat: Mucous membranes are moist.  Neck: No stridor.   Cardiovascular: Normal rate, regular rhythm. Grossly normal heart sounds.   Respiratory: Normal respiratory effort.  No retractions. Lungs CTAB. Gastrointestinal: Soft With moderate epigastric tenderness and mild right lower quadrant tenderness to palpation. Negative Murphy sign. No distention. No CVA tenderness. Musculoskeletal: No lower extremity tenderness nor edema.  No joint effusions. Neurologic:  Normal speech and language. No gross focal neurologic deficits are appreciated.  Skin:  Skin is warm, dry and intact. No rash noted. Psychiatric: Mood and affect are normal. Speech and behavior are normal.  ____________________________________________   LABS (all labs ordered are listed, but only abnormal results are displayed)  Labs Reviewed  COMPREHENSIVE METABOLIC PANEL - Abnormal; Notable for the following:       Result Value   Glucose, Bld 107 (*)    Total Bilirubin 0.2 (*)    All other components within normal limits  URINALYSIS COMPLETEWITH MICROSCOPIC ([facility] ONLY) - Abnormal; Notable for the following:    Color, Urine STRAW (*)    APPearance CLEAR (*)    Specific Gravity, Urine 1.004 (*)    All other components within normal limits  LIPASE, BLOOD  CBC  TROPONIN I   ____________________________________________  EKG  ED ECG  REPORT I, Doran Stabler, the attending physician, personally viewed and interpreted this ECG.   Date: 05/03/2016  EKG Time: 0836  Rate: 57  Rhythm: normal sinus rhythm  Axis: Normal  Intervals:none  ST&T Change: No ST segment elevation or depression. No abnormal T-wave inversion.  ____________________________________________  RADIOLOGY  CT Abdomen Pelvis W Contrast (Final result)  Result time 05/03/16 10:42:26  Final result by Lahoma Crocker, MD (05/03/16 10:42:26)           Narrative:   CLINICAL DATA: Epigastric pain for 4 days, right lower quadrant pain  EXAM: CT ABDOMEN AND PELVIS WITH CONTRAST  TECHNIQUE: Multidetector CT imaging of the abdomen and pelvis was performed using the standard protocol following bolus administration of intravenous contrast.  CONTRAST: 145mL ISOVUE-300 IOPAMIDOL (ISOVUE-300)  INJECTION 61%  COMPARISON: 04/19/2015  FINDINGS: Lower chest: Small hiatal hernia. The lung bases are unremarkable.  Hepatobiliary: Enhanced liver is unremarkable. No calcified gallstones are noted within gallbladder.  Pancreas: Enhanced pancreas is unremarkable.  Spleen: Enhanced spleen is unremarkable.  Adrenals/Urinary Tract: No adrenal gland mass. No hydronephrosis or hydroureter. Delayed renal images shows bilateral renal symmetrical excretion. Bilateral visualized proximal ureter is unremarkable.  Stomach/Bowel: No small bowel obstruction. No pericecal inflammation. Terminal ileum is unremarkable. Normal appendix noted in axial image 61. Few diverticula are noted proximal sigmoid colon. No evidence of acute colitis or diverticulitis.  Vascular/Lymphatic: No aortic aneurysm. No adenopathy.  Reproductive: Prostate gland and seminal vesicles are unremarkable.  Other: No ascites or free abdominal air. Mild distended urinary bladder. No bladder filling defects are noted.  Bilateral inguinal scrotal canal hernia containing fat without evidence  of acute complication. There is a lipoma left lateral pelvic wall at the level of proximal femur measures about 7.4 cm by 2.4 cm.  Musculoskeletal: No destructive bony lesions are noted. Sagittal images of the spine shows degenerative changes thoracolumbar spine.  IMPRESSION: 1. There is no evidence of acute inflammatory process within abdomen. 2. Small hiatal hernia. 3. Normal appendix. No pericecal inflammation. 4. No hydronephrosis or hydroureter. 5. Small bilateral inguinal scrotal canal hernia containing fat without evidence of acute complication. A subcutaneous lipoma is noted in left pelvic wall laterally measures 7.4 x 2.4 cm. Clinical correlation is necessary.   Electronically Signed By: Lahoma Crocker M.D. On: 05/03/2016 10:42          ____________________________________________   PROCEDURES  Procedure(s) performed:   Procedures  Critical Care performed:   ____________________________________________   INITIAL IMPRESSION / ASSESSMENT AND PLAN / ED COURSE  Pertinent labs & imaging results that were available during my care of the patient were reviewed by me and considered in my medical decision making (see chart for details).  ----------------------------------------- 12:03 PM on 05/03/2016 -----------------------------------------  Reassuring workup for emergent pathology. Patient says that his pain is improved after the morphine. He was able to tolerate the by mouth contrast. On further questioning, he says that the pain is not worse with exertion. Says that it has been coming and going but does not get worse with eating.  He already takes Protonix. We'll add sucralfate. We'll also give Zofran as needed. I will also be giving the phone number for contact with the GI service. Possible pain from his hiatal hernia. Also possible stomach ulcer. We'll discharge to home. Patient understands the plan and is willing to comply. Will return for any worsening or  concerning symptoms.  Clinical Course      ____________________________________________   FINAL CLINICAL IMPRESSION(S) / ED DIAGNOSES  Abdominal pain. Nausea.    NEW MEDICATIONS STARTED DURING THIS VISIT:  New Prescriptions   No medications on file     Note:  This document was prepared using Dragon voice recognition software and may include unintentional dictation errors.    Orbie Pyo, MD 05/03/16 [phone]

## 2016-05-14 ENCOUNTER — Ambulatory Visit: Payer: Self-pay | Admitting: Gastroenterology

## 2016-05-24 DIAGNOSIS — S93401A Sprain of unspecified ligament of right ankle, initial encounter: Secondary | ICD-10-CM | POA: Insufficient documentation

## 2016-06-26 ENCOUNTER — Encounter: Payer: Self-pay | Admitting: Family Medicine

## 2016-06-29 ENCOUNTER — Ambulatory Visit (INDEPENDENT_AMBULATORY_CARE_PROVIDER_SITE_OTHER): Payer: 59 | Admitting: Family Medicine

## 2016-06-29 DIAGNOSIS — Z0289 Encounter for other administrative examinations: Secondary | ICD-10-CM

## 2016-07-16 ENCOUNTER — Encounter (INDEPENDENT_AMBULATORY_CARE_PROVIDER_SITE_OTHER): Payer: Self-pay | Admitting: Orthopedic Surgery

## 2016-07-16 ENCOUNTER — Ambulatory Visit (INDEPENDENT_AMBULATORY_CARE_PROVIDER_SITE_OTHER): Payer: Self-pay

## 2016-07-16 ENCOUNTER — Ambulatory Visit (INDEPENDENT_AMBULATORY_CARE_PROVIDER_SITE_OTHER): Payer: Worker's Compensation | Admitting: Orthopedic Surgery

## 2016-07-16 VITALS — Ht 66.0 in | Wt 210.0 lb

## 2016-07-16 DIAGNOSIS — M25571 Pain in right ankle and joints of right foot: Secondary | ICD-10-CM

## 2016-07-16 DIAGNOSIS — M24671 Ankylosis, right ankle: Secondary | ICD-10-CM

## 2016-07-16 MED ORDER — LIDOCAINE HCL 1 % IJ SOLN
1.0000 mL | INTRAMUSCULAR | Status: AC | PRN
  Administered 2016-07-16: 1 mL

## 2016-07-16 MED ORDER — METHYLPREDNISOLONE ACETATE 40 MG/ML IJ SUSP
40.0000 mg | INTRAMUSCULAR | Status: AC | PRN
  Administered 2016-07-16: 40 mg via INTRA_ARTICULAR
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
## 2016-07-24 ENCOUNTER — Telehealth (INDEPENDENT_AMBULATORY_CARE_PROVIDER_SITE_OTHER): Payer: Self-pay | Admitting: Orthopedic Surgery

## 2016-07-24 NOTE — Telephone Encounter (Signed)
WC adjuster needs pt 1/22 visit note  [phone] sharon

## 2016-07-25 NOTE — Telephone Encounter (Signed)
How can we send just the note without all of the pt data that is not relative to the work comp case?

## 2016-07-30 ENCOUNTER — Telehealth (INDEPENDENT_AMBULATORY_CARE_PROVIDER_SITE_OTHER): Payer: Self-pay | Admitting: Orthopedic Surgery

## 2016-07-30 ENCOUNTER — Encounter (INDEPENDENT_AMBULATORY_CARE_PROVIDER_SITE_OTHER): Payer: Self-pay | Admitting: Orthopedic Surgery

## 2016-07-30 ENCOUNTER — Ambulatory Visit (INDEPENDENT_AMBULATORY_CARE_PROVIDER_SITE_OTHER): Payer: Self-pay | Admitting: Orthopedic Surgery

## 2016-07-30 ENCOUNTER — Ambulatory Visit (INDEPENDENT_AMBULATORY_CARE_PROVIDER_SITE_OTHER): Payer: Worker's Compensation | Admitting: Orthopedic Surgery

## 2016-07-30 VITALS — Ht 66.0 in | Wt 210.0 lb

## 2016-07-30 DIAGNOSIS — M24671 Ankylosis, right ankle: Secondary | ICD-10-CM

## 2016-07-30 NOTE — Telephone Encounter (Signed)
Faxed office visit note as requested. 

## 2016-07-30 NOTE — Progress Notes (Addendum)
Office Visit Note   Patient: Wesley Bridges           Date of Birth: 1970/08/30           MRN: [id number] Visit Date: 07/30/2016              Requested by: Leone Haven, MD [address] [place], [place] [address] PCP: [name], MD  Chief Complaint  Patient presents with  . Right Ankle - Follow-up    DOI 04/27/16 workers comp    HPI: Patient presents for 2 week follow up of right ankle. He is status post right subtalar injection which provided him with no relief. He states after injection he had 7-10 days of worsening pain. He states now his pain is back to normal pain. He is ambulating with crutches and cam walker. Maxcine Ham, RT    Assessment & Plan: Visit Diagnoses:  1. Fibrosis of subtalar joint, right     Plan: Discussed with the patient and independently as well as with his medical case manager that with the subtalar arthrosis his options are to increase his activities as tolerated without surgical intervention or proceed with surgical fusion. Discussed the procedure would be a posterior arthroscopic subtalar arthrodesis. This would be outpatient surgery he would be off his foot for 2 months. Patient states he is unsure if he wants to proceed with surgery we will plan to follow-up in 4 weeks he is given a note to be out of work for 4 weeks. I discussed that I would not consider fusing in the other joints that he may develop further arthritis in his midfoot and this would need to be treated with orthotics and braces and not surgery.  Send copy to [place] case manager fax [phone]  Follow-Up Instructions: Return in about 4 weeks (around 08/27/2016).   Ortho Exam On examination patient is alert oriented no adenopathy well-dressed normal affect normal respiratory effort he does have an antalgic gait he had increased pain from the subtalar injection which confirms the location of his pain to the subtalar joint he has good  pulses there is no dystrophic changes to the skin no hypersensitivity to light touch no signs of infection. He is tender to palpation of the sinus Tarsi and attempted subtalar motion reproduces pain.  Imaging: No results found.  Orders:  No orders of the defined types were placed in this encounter.  No orders of the defined types were placed in this encounter.    Procedures: No procedures performed  Clinical Data: No additional findings.  Subjective: Review of Systems  Objective: Vital Signs: Ht 5\' 6"  (1.676 m)   Wt 210 lb (95.3 kg)   BMI 33.89 kg/m   Specialty Comments:  No specialty comments available.  PMFS History: Patient Active Problem List   Diagnosis Date Noted  . Fibrosis of subtalar joint, right 07/16/2016  . Acute upper respiratory infection 04/18/2016  . Joint pain 04/18/2016  . Enlarged prostate 03/22/2016  . Lightheadedness 12/15/2015  . Syncope 12/06/2015  . Injury of toe on left foot 10/31/2015  . Swelling of lower extremity 10/31/2015  . Esophageal reflux 10/31/2015  . Numbness 10/31/2015  . Hyperlipidemia 04/13/2015  . Essential hypertension 03/18/2015  . Headache 03/18/2015  . Chest pain 03/18/2015   Past Medical History:  Diagnosis Date  . Asthma   . Chronic headaches   . GERD (gastroesophageal reflux disease)   . Hyperlipidemia   .  Hypertension     Family History  Problem Relation Age of Onset  . Arthritis      parents  . Hypertension Mother     Past Surgical History:  Procedure Laterality Date  . ANKLE FRACTURE SURGERY    . ANKLE FUSION    . FEMUR FRACTURE SURGERY    . MANDIBLE FRACTURE SURGERY     Social History   Occupational History  . Not on file.   Social History Main Topics  . Smoking status: Former Research scientist (life sciences)  . Smokeless tobacco: Never Used  . Alcohol use 3.6 - 4.8 oz/week    6 - 8 Cans of beer per week     Comment: Beers 2-3/night  . Drug use: No  . Sexual activity: Not on file

## 2016-07-30 NOTE — Telephone Encounter (Signed)
Ivin Booty [place] with [employer] called and is wanting a copy of the office note for Patient Wesley Bridges for the 07/16/2016 visit so that they know what is going on with the patient.  Her call back number is [phone].  Her fax [phone].  Thank you.

## 2016-07-31 NOTE — Telephone Encounter (Signed)
Thanks

## 2016-08-02 NOTE — Telephone Encounter (Signed)
faxed

## 2016-08-21 ENCOUNTER — Telehealth (INDEPENDENT_AMBULATORY_CARE_PROVIDER_SITE_OTHER): Payer: Self-pay | Admitting: Orthopedic Surgery

## 2016-08-21 NOTE — Telephone Encounter (Signed)
Pt called and asked for Dr. Sharol Given to send order for his surgery so WC can approve it.   [phone]

## 2016-08-22 NOTE — Telephone Encounter (Signed)
Please review and, if ok with you, fill out surgical sheet. Thank you!

## 2016-08-23 NOTE — Telephone Encounter (Signed)
Blue sheet completed.

## 2016-08-24 NOTE — Progress Notes (Signed)
Office Visit Note   Patient: Wesley Bridges           Date of Birth: 1971-01-20           MRN: [id number] Visit Date: 08/27/2016              Requested by: Leone Haven, MD [address] [place], [place] [address] PCP: [name], MD  Chief Complaint  Patient presents with  . Right Ankle - Follow-up    Pending posterior arthroscopic subtalar arthrodesis. W/C injury 04/27/16.    HPI: Patient is a 46 y.o male here for follow up of right ankle. He is status post workers comp injury 04/27/16. He is here for discussion of surgical intervention of posterior arthroscopic subtalar arthrodesis. There eare no change in symptoms and continues to ambulate with cam walker. Maxcine Ham, RT    Assessment & Plan: Visit Diagnoses:  1. Fibrosis of subtalar joint, right     Plan: Patient is failed conservative therapy for his subtalar arthrosis on the right. He has pain with activities of daily living he cannot stand on his feet for prolonged periods of time. Patient states her like to proceed with posterior arthroscopic subtalar arthrodesis. I discussed this with his medical case manager separately with the patient. They both agreed to proceeding with surgery. We'll plan for outpatient surgery at [facility] with nonweightbearing for 4 weeks protected weightbearing for an additional 8 weeks in a fracture boot. Anticipate out of work for 3 additional months he is given a note to be out of work for 3 months.  Follow-Up Instructions: Return if symptoms worsen or fail to improve.   Ortho Exam Examination patient is alert oriented no adenopathy well-dressed normal affect normal respiratory effort he has an antalgic gait. Examination is a good dorsalis pedis pulse he has pain to palpation over the sinus Tarsi he has pain with attempted subtalar motion he has good ankle motion. ROS: Complete review of systems negative except as mentioned in the history of present  illness. Imaging: No results found.  Labs: No results found for: HGBA1C, ESRSEDRATE, CRP, LABURIC, REPTSTATUS, GRAMSTAIN, CULT, LABORGA  Orders:  No orders of the defined types were placed in this encounter.  No orders of the defined types were placed in this encounter.    Procedures: No procedures performed  Clinical Data: No additional findings.  Subjective: Review of Systems  Objective: Vital Signs: There were no vitals taken for this visit.  Specialty Comments:  No specialty comments available.  PMFS History: Patient Active Problem List   Diagnosis Date Noted  . Fibrosis of subtalar joint, right 07/16/2016  . Acute upper respiratory infection 04/18/2016  . Joint pain 04/18/2016  . Enlarged prostate 03/22/2016  . Lightheadedness 12/15/2015  . Syncope 12/06/2015  . Injury of toe on left foot 10/31/2015  . Swelling of lower extremity 10/31/2015  . Esophageal reflux 10/31/2015  . Numbness 10/31/2015  . Hyperlipidemia 04/13/2015  . Essential hypertension 03/18/2015  . Headache 03/18/2015  . Chest pain 03/18/2015   Past Medical History:  Diagnosis Date  . Asthma   . Chronic headaches   . GERD (gastroesophageal reflux disease)   . Hyperlipidemia   . Hypertension     Family History  Problem Relation Age of Onset  . Arthritis      parents  . Hypertension Mother     Past Surgical History:  Procedure Laterality Date  . ANKLE FRACTURE SURGERY    . ANKLE FUSION    .  FEMUR FRACTURE SURGERY    . MANDIBLE FRACTURE SURGERY     Social History   Occupational History  . Not on file.   Social History Main Topics  . Smoking status: Former Research scientist (life sciences)  . Smokeless tobacco: Never Used  . Alcohol use 3.6 - 4.8 oz/week    6 - 8 Cans of beer per week     Comment: Beers 2-3/night  . Drug use: No  . Sexual activity: Not on file

## 2016-08-27 ENCOUNTER — Ambulatory Visit (INDEPENDENT_AMBULATORY_CARE_PROVIDER_SITE_OTHER): Payer: Worker's Compensation | Admitting: Orthopedic Surgery

## 2016-08-27 ENCOUNTER — Encounter (INDEPENDENT_AMBULATORY_CARE_PROVIDER_SITE_OTHER): Payer: Self-pay | Admitting: Orthopedic Surgery

## 2016-08-27 DIAGNOSIS — M24671 Ankylosis, right ankle: Secondary | ICD-10-CM | POA: Diagnosis not present

## 2016-09-07 NOTE — Telephone Encounter (Signed)
Approved by WC. Will call patient to schedule.

## 2016-09-10 ENCOUNTER — Telehealth (INDEPENDENT_AMBULATORY_CARE_PROVIDER_SITE_OTHER): Payer: Self-pay

## 2016-09-10 NOTE — Telephone Encounter (Signed)
Received voicemail from case mgr requesting the 08/27/16 and 07/30/16 office notes be faxed to her @ [phone]. Faxed.

## 2016-09-10 NOTE — Telephone Encounter (Signed)
Patients case mgr Acquanetta Belling is requesting a call back from you regarding setting up surgery on this patient.

## 2016-09-11 ENCOUNTER — Telehealth (INDEPENDENT_AMBULATORY_CARE_PROVIDER_SITE_OTHER): Payer: Self-pay | Admitting: Orthopedic Surgery

## 2016-09-11 NOTE — Telephone Encounter (Signed)
Wesley Bridges was at the office and stopped by my office to schedule.  We have [place] scheduled for 09/26/2016 at [place]. We also coordinated patient's post operative appt for 10/08/2016.  All surgery info given.

## 2016-09-11 NOTE — Telephone Encounter (Signed)
RECORDS 06/25/2016 FAXED [place] [phone]

## 2016-09-12 ENCOUNTER — Other Ambulatory Visit (INDEPENDENT_AMBULATORY_CARE_PROVIDER_SITE_OTHER): Payer: Self-pay | Admitting: Family

## 2016-09-14 ENCOUNTER — Ambulatory Visit (INDEPENDENT_AMBULATORY_CARE_PROVIDER_SITE_OTHER): Payer: BLUE CROSS/BLUE SHIELD | Admitting: Family Medicine

## 2016-09-14 ENCOUNTER — Encounter: Payer: Self-pay | Admitting: Family Medicine

## 2016-09-14 ENCOUNTER — Other Ambulatory Visit (INDEPENDENT_AMBULATORY_CARE_PROVIDER_SITE_OTHER): Payer: BLUE CROSS/BLUE SHIELD

## 2016-09-14 VITALS — BP 130/90 | HR 63 | Temp 98.1°F | Wt 220.0 lb

## 2016-09-14 DIAGNOSIS — K4020 Bilateral inguinal hernia, without obstruction or gangrene, not specified as recurrent: Secondary | ICD-10-CM

## 2016-09-14 DIAGNOSIS — E785 Hyperlipidemia, unspecified: Secondary | ICD-10-CM | POA: Diagnosis not present

## 2016-09-14 DIAGNOSIS — K219 Gastro-esophageal reflux disease without esophagitis: Secondary | ICD-10-CM

## 2016-09-14 DIAGNOSIS — F321 Major depressive disorder, single episode, moderate: Secondary | ICD-10-CM

## 2016-09-14 DIAGNOSIS — R7989 Other specified abnormal findings of blood chemistry: Secondary | ICD-10-CM

## 2016-09-14 DIAGNOSIS — R002 Palpitations: Secondary | ICD-10-CM | POA: Diagnosis not present

## 2016-09-14 DIAGNOSIS — I10 Essential (primary) hypertension: Secondary | ICD-10-CM | POA: Diagnosis not present

## 2016-09-14 DIAGNOSIS — R946 Abnormal results of thyroid function studies: Secondary | ICD-10-CM

## 2016-09-14 LAB — T4, FREE: Free T4: 1.22 ng/dL (ref 0.60–1.60)

## 2016-09-14 LAB — LIPID PANEL
Cholesterol: 146 mg/dL (ref 0–200)
HDL: 43.7 mg/dL (ref >39.00)
LDL Cholesterol: 90 mg/dL (ref 0–99)
NonHDL: 102.02
TRIGLYCERIDES: 60 mg/dL (ref 0.0–149.0)
Total CHOL/HDL Ratio: 3
VLDL: 12 mg/dL (ref 0.0–40.0)

## 2016-09-14 LAB — COMPREHENSIVE METABOLIC PANEL
ALT: 28 U/L (ref 0–53)
AST: 18 U/L (ref 0–37)
Albumin: 4.2 g/dL (ref 3.5–5.2)
Alkaline Phosphatase: 91 U/L (ref 39–117)
BUN: 15 mg/dL (ref 6–23)
CALCIUM: 9.5 mg/dL (ref 8.4–10.5)
CO2: 27 meq/L (ref 19–32)
CREATININE: 0.8 mg/dL (ref 0.40–1.50)
Chloride: 107 mEq/L (ref 96–112)
GFR: 110.09 mL/min (ref >60.00)
Glucose, Bld: 104 mg/dL — ABNORMAL HIGH (ref 70–99)
Potassium: 4.2 mEq/L (ref 3.5–5.1)
SODIUM: 141 meq/L (ref 135–145)
Total Bilirubin: 0.4 mg/dL (ref 0.2–1.2)
Total Protein: 7.3 g/dL (ref 6.0–8.3)

## 2016-09-14 LAB — CBC
HCT: 43.9 % (ref 39.0–52.0)
Hemoglobin: 14.7 g/dL (ref 13.0–17.0)
MCHC: 33.5 g/dL (ref 30.0–36.0)
MCV: 88.6 fl (ref 78.0–100.0)
PLATELETS: 329 10*3/uL (ref 150.0–400.0)
RBC: 4.95 Mil/uL (ref 4.22–5.81)
RDW: 13.7 % (ref 11.5–15.5)
WBC: 7.5 10*3/uL (ref 4.0–10.5)

## 2016-09-14 LAB — TSH: TSH: 0.03 u[IU]/mL — ABNORMAL LOW (ref 0.35–4.50)

## 2016-09-14 LAB — T3, FREE: T3 FREE: 4.1 pg/mL (ref 2.3–4.2)

## 2016-09-14 MED ORDER — PANTOPRAZOLE SODIUM 40 MG PO TBEC: 40.0000 mg | DELAYED_RELEASE_TABLET | Freq: Two times a day (BID) | ORAL | 1 refills | Status: DC

## 2016-09-14 MED ORDER — SERTRALINE HCL 50 MG PO TABS: ORAL_TABLET | ORAL | 3 refills | Status: DC

## 2016-09-14 NOTE — Progress Notes (Signed)
[name], MD Phone: [phone]  Wesley Bridges is a 46 y.o. male who presents today for follow-up.  Hypertension: Not checking at home. Taking lisinopril and amlodipine. No chest pain, shortness breath, or edema.  Hyperlipidemia: Taking Lipitor. No right upper quadrant pain or myalgias.  Patient notes palpitations on one occasion last week. Lasting for about 10 minutes. It happened a few months ago as well. Occurred when he was just laying there. He had no syncope. He had no other symptoms with this. Saw cardiology about a year and a half ago and had a low risk stress test.  Patient notes continued left-sided upper abdominal discomfort. He had a CT abdomen and pelvis in November 2017 that revealed a small hiatal hernia and also small inguinal hernias. He notes no pain with the inguinal hernias. He notes no bulges. He is on Protonix. He notes no blood in his stool. He did not see GI. He has not had any vomiting or diarrhea.  Patient additionally notes that he does feel depressed. Most of it surrounds all the medical issues that he's had to deal with. He does have some thoughts of being better off dead though has no thoughts of killing himself or intent or plan to harm himself. He notes some issues getting and keeping erections though he is able to maintain full normal erections while asleep.  PMH: Former smoker   ROS see history of present illness  Objective  Physical Exam Vitals:   09/14/16 0813  BP: 130/90  Pulse: 63  Temp: 98.1 F (36.7 C)    BP Readings from Last 3 Encounters:  09/14/16 130/90  05/03/16 (!) 152/96  04/18/16 120/84   Wt Readings from Last 3 Encounters:  09/14/16 220 lb (99.8 kg)  07/30/16 210 lb (95.3 kg)  07/16/16 210 lb (95.3 kg)    Physical Exam  Constitutional: No distress.  HENT:  Head: Normocephalic and atraumatic.  Mouth/Throat: Oropharynx is clear and moist. No oropharyngeal exudate.  Eyes: Conjunctivae are normal. Pupils are  equal, round, and reactive to light.  Cardiovascular: Normal rate, regular rhythm and normal heart sounds.   Pulmonary/Chest: Effort normal and breath sounds normal.  Abdominal: Soft. Bowel sounds are normal. He exhibits no distension. There is no tenderness. There is no rebound and no guarding.  Genitourinary:  Genitourinary Comments: Normal penis, normal scrotum, bilateral testicles palpable, right testicle somewhat retracted though in normal orientation and able to bring down fully into the scrotum, normal testicles, normal epididymis, normal vas deferens, no hernias palpated  Neurological: He is alert. Gait normal.  Skin: Skin is warm. He is not diaphoretic.  Psychiatric:  Mood depressed, affect mildly flat   EKG: Normal sinus rhythm, rate 72, inverted T wave in lead 3, appears unchanged from prior EKG  Assessment/Plan: Please see individual problem list.  Essential hypertension At goal. Continue current medications.  Hyperlipidemia Tolerating Lipitor. Continue Lipitor.  Depression, major, single episode, moderate ([place]) Patient with feelings of depression. Does have thoughts of being better off dead though no thoughts of killing himself or intent or plan to harm himself. I feel that it is likely that his erectile issues are related to his depression given that he is able to have normal erections at night. Discussed starting on Zoloft to see if that would benefit his depression. Discussed monitoring his erectile issues and see if it does improve with treatment for depression. If not could consider medication for erectile dysfunction. Given return precautions.  Esophageal reflux Patient continues to  report left-sided upper abdominal discomfort. Does take Protonix for reflux. Given persistent symptoms we will refer back to GI for further evaluation.  Palpitations Patient with 2 fairly isolated episodes of palpitations over the last several months. EKG today is reassuring. We'll refer to  cardiology. Will obtain lab work as outlined below. He is given return precautions.  Inguinal hernia Patient with bilateral inguinal hernias noted on CT scan. Has a history of these and was advised previously per his report not to do anything about them yet. Has bowel movements daily. Asymptomatic. Offered referral to general surgery for evaluation though the patient deferred this at this time.   Orders Placed This Encounter  Procedures  . TSH  . CBC  . Comp Met (CMET)  . Lipid Profile  . T4, free    Standing Status:   Future    Number of Occurrences:   1    Standing Expiration Date:   09/14/2017  . T3, free    Standing Status:   Future    Number of Occurrences:   1    Standing Expiration Date:   09/14/2017  . Ambulatory referral to Cardiology    Referral Priority:   Routine    Referral Type:   Consultation    Referral Reason:   Specialty Services Required    Requested Specialty:   Cardiology    Number of Visits Requested:   1  . Ambulatory referral to Gastroenterology    Referral Priority:   Routine    Referral Type:   Consultation    Referral Reason:   Specialty Services Required    Number of Visits Requested:   1  . EKG 12-Lead    Meds ordered this encounter  Medications  . sertraline (ZOLOFT) 50 MG tablet    Sig: Take 25 mg (half a tablet) by mouth daily for 7 days, then take 50 mg (one tablet) by mouth daily    Dispense:  30 tablet    Refill:  3  . pantoprazole (PROTONIX) 40 MG tablet    Sig: Take 1 tablet (40 mg total) by mouth 2 (two) times daily before a meal.    Dispense:  180 tablet    Refill:  [place], MD [place]

## 2016-09-14 NOTE — Progress Notes (Signed)
Pre visit review using our clinic review tool, if applicable. No additional management support is needed unless otherwise documented below in the visit note. 

## 2016-09-14 NOTE — Patient Instructions (Signed)
Nice to see you. We'll get lab work today. We will refer you to cardiology. We will get you to see GI. If you have persistent palpitations, chest pain, shortness breath, or abdominal pain please seek medical attention immediately. If you develop thoughts of harming your self please seek medical attention immediately as well.

## 2016-09-16 DIAGNOSIS — K4090 Unilateral inguinal hernia, without obstruction or gangrene, not specified as recurrent: Secondary | ICD-10-CM | POA: Insufficient documentation

## 2016-09-16 DIAGNOSIS — R002 Palpitations: Secondary | ICD-10-CM | POA: Insufficient documentation

## 2016-09-16 DIAGNOSIS — F329 Major depressive disorder, single episode, unspecified: Secondary | ICD-10-CM | POA: Insufficient documentation

## 2016-09-16 NOTE — Assessment & Plan Note (Signed)
Patient continues to report left-sided upper abdominal discomfort. Does take Protonix for reflux. Given persistent symptoms we will refer back to GI for further evaluation.

## 2016-09-16 NOTE — Assessment & Plan Note (Signed)
Tolerating Lipitor. Continue Lipitor. 

## 2016-09-16 NOTE — Assessment & Plan Note (Signed)
Patient with bilateral inguinal hernias noted on CT scan. Has a history of these and was advised previously per his report not to do anything about them yet. Has bowel movements daily. Asymptomatic. Offered referral to general surgery for evaluation though the patient deferred this at this time.

## 2016-09-16 NOTE — Assessment & Plan Note (Signed)
At goal. Continue current medications. 

## 2016-09-16 NOTE — Assessment & Plan Note (Signed)
Patient with feelings of depression. Does have thoughts of being better off dead though no thoughts of killing himself or intent or plan to harm himself. I feel that it is likely that his erectile issues are related to his depression given that he is able to have normal erections at night. Discussed starting on Zoloft to see if that would benefit his depression. Discussed monitoring his erectile issues and see if it does improve with treatment for depression. If not could consider medication for erectile dysfunction. Given return precautions.

## 2016-09-16 NOTE — Assessment & Plan Note (Signed)
Patient with 2 fairly isolated episodes of palpitations over the last several months. EKG today is reassuring. We'll refer to cardiology. Will obtain lab work as outlined below. He is given return precautions.

## 2016-09-18 ENCOUNTER — Other Ambulatory Visit: Payer: Self-pay | Admitting: Family Medicine

## 2016-09-18 DIAGNOSIS — E0590 Thyrotoxicosis, unspecified without thyrotoxic crisis or storm: Secondary | ICD-10-CM

## 2016-09-19 ENCOUNTER — Ambulatory Visit: Payer: Self-pay | Admitting: Internal Medicine

## 2016-09-19 ENCOUNTER — Telehealth: Payer: Self-pay

## 2016-09-19 NOTE — Telephone Encounter (Signed)
PA for twice daily protonix submitted.

## 2016-09-20 ENCOUNTER — Inpatient Hospital Stay (HOSPITAL_COMMUNITY): Admission: RE | Admit: 2016-09-20 | Payer: Self-pay | Source: Ambulatory Visit

## 2016-09-24 NOTE — Telephone Encounter (Signed)
Spoke with patient regarding prior authorization for Protonix. Advised that his insurance company would not cover twice a day was sitting. Discussed that he should continue it once per day and we will get him to see GI as planned. Confirm that he had a GI appointment with [facility] later this month. Also confirmed that they canceled his ankle surgery until he sees cardiology. He has appointment with cardiology on the 11th.

## 2016-09-24 NOTE — Telephone Encounter (Signed)
PA for twice daily Protonix denied due to medical necessity, plan allows for once daily dosing.

## 2016-09-26 ENCOUNTER — Ambulatory Visit (HOSPITAL_COMMUNITY)
Admission: RE | Admit: 2016-09-26 | Payer: Worker's Compensation | Source: Ambulatory Visit | Admitting: Orthopedic Surgery

## 2016-09-26 ENCOUNTER — Encounter (HOSPITAL_COMMUNITY): Admission: RE | Payer: Self-pay | Source: Ambulatory Visit

## 2016-09-26 SURGERY — ANKLE ARTHROSCOPY WITH FUSION
Anesthesia: General | Laterality: Right

## 2016-09-28 ENCOUNTER — Other Ambulatory Visit: Payer: Self-pay | Admitting: Family Medicine

## 2016-10-03 ENCOUNTER — Encounter: Payer: Self-pay | Admitting: Internal Medicine

## 2016-10-03 ENCOUNTER — Ambulatory Visit (INDEPENDENT_AMBULATORY_CARE_PROVIDER_SITE_OTHER): Payer: BLUE CROSS/BLUE SHIELD | Admitting: Internal Medicine

## 2016-10-03 VITALS — BP 138/100 | HR 60 | Ht 66.0 in | Wt 220.8 lb

## 2016-10-03 DIAGNOSIS — Z01810 Encounter for preprocedural cardiovascular examination: Secondary | ICD-10-CM | POA: Diagnosis not present

## 2016-10-03 DIAGNOSIS — Z79899 Other long term (current) drug therapy: Secondary | ICD-10-CM

## 2016-10-03 DIAGNOSIS — R55 Syncope and collapse: Secondary | ICD-10-CM | POA: Diagnosis not present

## 2016-10-03 DIAGNOSIS — R002 Palpitations: Secondary | ICD-10-CM

## 2016-10-03 DIAGNOSIS — I10 Essential (primary) hypertension: Secondary | ICD-10-CM | POA: Diagnosis not present

## 2016-10-03 MED ORDER — LISINOPRIL 10 MG PO TABS: 10.0000 mg | ORAL_TABLET | Freq: Every day | ORAL | 3 refills | Status: DC
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
## 2016-10-03 NOTE — Patient Instructions (Signed)
Medication Instructions:  Your physician has recommended you make the following change in your medication:  1- INCREASE Lisinopril to 10 mg by mouth once a day.   Labwork: Your physician recommends that you return for lab work in: [place] ([place]) AT [place] ON October 17, 2016.  - Please go to the [facility]. You will check in at the front desk to the right as you walk into the atrium. Valet Parking is offered if needed.     Testing/Procedures: Your physician has requested that you have an echocardiogram. Echocardiography is a painless test that uses sound waves to create images of your heart. It provides your doctor with information about the size and shape of your heart and how well your heart's chambers and valves are working. This procedure takes approximately one hour. There are no restrictions for this procedure.  Your physician has recommended that you wear an [place] event monitor. Event monitors are medical devices that record the heart's electrical activity. Doctors most often [place] these monitors to diagnose arrhythmias. Arrhythmias are problems with the speed or rhythm of the heartbeat. The monitor is a small, portable device. You can wear one while you do your normal daily activities. This is usually used to diagnose what is causing palpitations/syncope (passing out).  - You will be mailed a monitor from [employer].  - They will call you in the next day or so to verify your address. Then is will take 5-7 days to be mailed to you. - You will wear for 30 days and then place all the pieces of equipment that came with the device back in the provided box and take it to your nearest UPS drop off locations. - Call [employer] at [phone], if you have any questions concerning the monitor once you have received it. - DO NOT GET THE MONITOR WET.  Follow-Up: Your physician recommends that you schedule a follow-up appointment in: AFTER MONITOR HAS BEEN WORN 30  DAYS (APPROX. 7-8 WEEKS) WITH DR END.   If you need a refill on your cardiac medications before your next appointment, please call your pharmacy.   Echocardiogram An echocardiogram, or echocardiography, uses sound waves (ultrasound) to produce an image of your heart. The echocardiogram is simple, painless, obtained within a short period of time, and offers valuable information to your health care provider. The images from an echocardiogram can provide information such as:  Evidence of coronary artery disease (CAD).  Heart size.  Heart muscle function.  Heart valve function.  Aneurysm detection.  Evidence of a past heart attack.  Fluid buildup around the heart.  Heart muscle thickening.  Assess heart valve function. Tell a health care provider about:  Any allergies you have.  All medicines you are taking, including vitamins, herbs, eye drops, creams, and over-the-counter medicines.  Any problems you or family members have had with anesthetic medicines.  Any blood disorders you have.  Any surgeries you have had.  Any medical conditions you have.  Whether you are pregnant or may be pregnant. What happens before the procedure? No special preparation is needed. Eat and drink normally. What happens during the procedure?  In order to produce an image of your heart, gel will be applied to your chest and a wand-like tool (transducer) will be moved over your chest. The gel will help transmit the sound waves from the transducer. The sound waves will harmlessly bounce off your heart to allow the heart images to be captured in  real-time motion. These images will then be recorded.  You may need an IV to receive a medicine that improves the quality of the pictures. What happens after the procedure? You may return to your normal schedule including diet, activities, and medicines, unless your health care provider tells you otherwise. This information is not intended to replace advice  given to you by your health care provider. Make sure you discuss any questions you have with your health care provider. Document Released: 06/08/2000 Document Revised: 01/28/2016 Document Reviewed: 02/16/2013 Elsevier Interactive Patient Education  2017 [employer].

## 2016-10-08 ENCOUNTER — Inpatient Hospital Stay (INDEPENDENT_AMBULATORY_CARE_PROVIDER_SITE_OTHER): Payer: Self-pay | Admitting: Orthopedic Surgery

## 2016-10-08 ENCOUNTER — Inpatient Hospital Stay (INDEPENDENT_AMBULATORY_CARE_PROVIDER_SITE_OTHER): Payer: Self-pay | Admitting: Family

## 2016-10-09 ENCOUNTER — Ambulatory Visit (INDEPENDENT_AMBULATORY_CARE_PROVIDER_SITE_OTHER): Payer: BLUE CROSS/BLUE SHIELD

## 2016-10-09 DIAGNOSIS — R55 Syncope and collapse: Secondary | ICD-10-CM | POA: Diagnosis not present

## 2016-10-09 DIAGNOSIS — R002 Palpitations: Secondary | ICD-10-CM

## 2016-10-18 ENCOUNTER — Other Ambulatory Visit
Admission: RE | Admit: 2016-10-18 | Discharge: 2016-10-18 | Disposition: A | Discharge status: Home / Self Care | Payer: BLUE CROSS/BLUE SHIELD | Source: Ambulatory Visit | Attending: Internal Medicine | Admitting: Internal Medicine

## 2016-10-18 DIAGNOSIS — R002 Palpitations: Secondary | ICD-10-CM | POA: Insufficient documentation

## 2016-10-18 DIAGNOSIS — R55 Syncope and collapse: Secondary | ICD-10-CM | POA: Diagnosis present

## 2016-10-18 DIAGNOSIS — Z79899 Other long term (current) drug therapy: Secondary | ICD-10-CM | POA: Diagnosis present

## 2016-10-18 LAB — BASIC METABOLIC PANEL WITH GFR
Anion gap: 7 (ref 5–15)
BUN: 12 mg/dL (ref 6–20)
CO2: 27 mmol/L (ref 22–32)
Calcium: 9.2 mg/dL (ref 8.9–10.3)
Chloride: 104 mmol/L (ref 101–111)
Creatinine, Ser: 0.79 mg/dL (ref 0.61–1.24)
GFR calc Af Amer: >60 mL/min
GFR calc non Af Amer: >60 mL/min
Glucose, Bld: 103 mg/dL — ABNORMAL HIGH (ref 65–99)
Potassium: 3.9 mmol/L (ref 3.5–5.1)
Sodium: 138 mmol/L (ref 135–145)

## 2016-11-02 ENCOUNTER — Other Ambulatory Visit: Payer: BLUE CROSS/BLUE SHIELD

## 2016-11-03 ENCOUNTER — Emergency Department: Payer: BLUE CROSS/BLUE SHIELD

## 2016-11-03 ENCOUNTER — Encounter: Payer: Self-pay | Admitting: Emergency Medicine

## 2016-11-03 ENCOUNTER — Emergency Department
Admission: EM | Admit: 2016-11-03 | Discharge: 2016-11-03 | Disposition: A | Discharge status: Home / Self Care | Payer: BLUE CROSS/BLUE SHIELD | Attending: Emergency Medicine | Admitting: Emergency Medicine

## 2016-11-03 DIAGNOSIS — R079 Chest pain, unspecified: Secondary | ICD-10-CM | POA: Diagnosis present

## 2016-11-03 DIAGNOSIS — R0600 Dyspnea, unspecified: Secondary | ICD-10-CM | POA: Diagnosis not present

## 2016-11-03 DIAGNOSIS — Z79899 Other long term (current) drug therapy: Secondary | ICD-10-CM | POA: Insufficient documentation

## 2016-11-03 DIAGNOSIS — I10 Essential (primary) hypertension: Secondary | ICD-10-CM | POA: Diagnosis not present

## 2016-11-03 DIAGNOSIS — J45909 Unspecified asthma, uncomplicated: Secondary | ICD-10-CM | POA: Diagnosis not present

## 2016-11-03 LAB — BASIC METABOLIC PANEL
ANION GAP: 9 (ref 5–15)
BUN: 20 mg/dL (ref 6–20)
CALCIUM: 9.7 mg/dL (ref 8.9–10.3)
CO2: 25 mmol/L (ref 22–32)
Chloride: 102 mmol/L (ref 101–111)
Creatinine, Ser: 0.99 mg/dL (ref 0.61–1.24)
GFR calc non Af Amer: >60 mL/min (ref >60)
Glucose, Bld: 126 mg/dL — ABNORMAL HIGH (ref 65–99)
POTASSIUM: 3.4 mmol/L — AB (ref 3.5–5.1)
Sodium: 136 mmol/L (ref 135–145)

## 2016-11-03 LAB — CBC
HEMATOCRIT: 47.7 % (ref 40.0–52.0)
HEMOGLOBIN: 16.6 g/dL (ref 13.0–18.0)
MCH: 30.6 pg (ref 26.0–34.0)
MCHC: 34.8 g/dL (ref 32.0–36.0)
MCV: 88.1 fL (ref 80.0–100.0)
Platelets: 319 10*3/uL (ref 150–440)
RBC: 5.41 MIL/uL (ref 4.40–5.90)
RDW: 14.7 % — ABNORMAL HIGH (ref 11.5–14.5)
WBC: 8.5 10*3/uL (ref 3.8–10.6)

## 2016-11-03 LAB — TROPONIN I: Troponin I: <0.03 ng/mL (ref <0.03)

## 2016-11-03 LAB — FIBRIN DERIVATIVES D-DIMER (ARMC ONLY): FIBRIN DERIVATIVES D-DIMER (ARMC): 674.27 — AB (ref 0.00–499.00)

## 2016-11-03 MED ORDER — IOPAMIDOL (ISOVUE-370) INJECTION 76%
75.0000 mL | Freq: Once | INTRAVENOUS | Status: AC | PRN
  Administered 2016-11-03: 75 mL via INTRAVENOUS

## 2016-11-03 MED ORDER — IOPAMIDOL (ISOVUE-300) INJECTION 61%: 75.0000 mL | Freq: Once | INTRAVENOUS | Status: DC | PRN

## 2016-11-03 NOTE — ED Provider Notes (Signed)
[facility] Emergency Department Provider Note    First MD Initiated Contact with Patient 11/03/16 1729     (approximate)  Time seen: 5:29 PM  I have reviewed the triage vital signs and the nursing notes.   HISTORY  Chief Complaint Shortness of Breath; Headache; and Chest Pain    HPI Toryn Mcclinton [name] is a 46 y.o. male with below list of chronic medical conditions including hypertension cardiac palpation board she is currently wearing a Holter monitor presents to the emergency department with acute onset of "stabbing" left-sided chest pain which occurred 3 times briefly. Patient states "it was really quick". Patient states that the pain did radiate to his left arm and while the chest pain has completely resolved and did so within a matter of seconds the left arm pain has persisted. Patient denies any dyspnea. Patient does have a orthopedic boot on the right leg secondary to a previous injury with "baseline swelling to that leg. Patient denies any personal family history of CAD DVT or PE. Of note patient was seen early this year for chest pain referred to cardiologist with a negative stress test performed at that time per the patient and his wife at bedside.   Past Medical History:  Diagnosis Date  . Asthma   . Chronic headaches   . GERD (gastroesophageal reflux disease)   . Hyperlipidemia   . Hypertension   . Subclinical hyperthyroidism     Patient Active Problem List   Diagnosis Date Noted  . Depression, major, single episode, moderate ([place]) 09/16/2016  . Palpitations 09/16/2016  . Inguinal hernia 09/16/2016  . Fibrosis of subtalar joint, right 07/16/2016  . Acute upper respiratory infection 04/18/2016  . Joint pain 04/18/2016  . Enlarged prostate 03/22/2016  . Lightheadedness 12/15/2015  . Syncope 12/06/2015  . Injury of toe on left foot 10/31/2015  . Swelling of lower extremity 10/31/2015  . Esophageal reflux 10/31/2015  . Numbness  10/31/2015  . Hyperlipidemia 04/13/2015  . Essential hypertension 03/18/2015  . Headache 03/18/2015  . Chest pain 03/18/2015    Past Surgical History:  Procedure Laterality Date  . ANKLE FRACTURE SURGERY    . ANKLE FUSION    . FEMUR FRACTURE SURGERY    . MANDIBLE FRACTURE SURGERY      Prior to Admission medications   Medication Sig Start Date End Date Taking? Authorizing Provider  acetaminophen (TYLENOL) 500 MG tablet Take 1,000 mg by mouth every 6 (six) hours as needed for headache.     [provider]  aspirin EC 81 MG tablet Take 1 tablet (81 mg total) by mouth daily. 03/23/15   Leone Haven, MD  atorvastatin (LIPITOR) 40 MG tablet TAKE 1 TABLET BY MOUTH ONCE DAILY 09/28/16   Leone Haven, MD  ibuprofen (ADVIL,MOTRIN) 800 MG tablet TAKE 1 TABLET BY MOUTH 3 TIMES DAY AS NEEDED FOR PAIN 06/28/16   [provider]  lisinopril (PRINIVIL,ZESTRIL) 10 MG tablet Take 1 tablet (10 mg total) by mouth daily. 10/03/16 01/01/17  End, Harrell Gave, MD  loratadine (CLARITIN) 10 MG tablet Take 1 tablet (10 mg total) by mouth daily. Patient taking differently: Take 20 mg by mouth daily.  04/18/16   Leone Haven, MD  Multiple Vitamin (MULTIVITAMIN WITH MINERALS) TABS tablet Take 1 tablet by mouth at bedtime.    [provider]  pantoprazole (PROTONIX) 40 MG tablet Take 1 tablet (40 mg total) by mouth 2 (two) times daily before a meal. 09/14/16   Sonnenberg,  Angela Adam, MD  potassium chloride 20 MEQ TBCR Take 20 mEq by mouth daily. Patient not taking: Reported on 09/14/2016 12/07/15   Hillary Bow, MD  sertraline (ZOLOFT) 50 MG tablet Take 25 mg (half a tablet) by mouth daily for 7 days, then take 50 mg (one tablet) by mouth daily Patient not taking: Reported on 09/14/2016 09/14/16   Leone Haven, MD    Allergies No known drug allergies  Family History  Problem Relation Age of Onset  . Arthritis Unknown        parents  . Hypertension Mother   . Thyroid  disease Mother   . Osteoporosis Mother   . Syncope episode Mother   . Hypertension Brother     Social History Social History  Substance Use Topics  . Smoking status: Never Smoker  . Smokeless tobacco: Never Used  . Alcohol use 8.4 oz/week    14 Cans of beer per week    Review of Systems Constitutional: No fever/chills Eyes: No visual changes. ENT: No sore throat. Cardiovascular: Positive for 4 chest pain. Respiratory: Denies shortness of breath. Gastrointestinal: No abdominal pain.  No nausea, no vomiting.  No diarrhea.  No constipation. Genitourinary: Negative for dysuria. Musculoskeletal: Negative for back pain. Integumentary: Negative for rash. Neurological: Negative for headaches, focal weakness or numbness.   ____________________________________________   PHYSICAL EXAM:  VITAL SIGNS: ED Triage Vitals  Enc Vitals Group     BP 11/03/16 1637 (!) 147/123     Pulse Rate 11/03/16 1637 87     Resp 11/03/16 1637 20     Temp 11/03/16 1637 99.1 F (37.3 C)     Temp Source 11/03/16 1637 Oral     SpO2 11/03/16 1637 98 %     Weight 11/03/16 1638 210 lb (95.3 kg)     Height 11/03/16 1638 5\' 6"  (1.676 m)     Head Circumference --      Peak Flow --      Pain Score 11/03/16 1637 3     Pain Loc --      Pain Edu? --      Excl. in [place]? --     Constitutional: Alert and oriented. Well appearing and in no acute distress. Eyes: Conjunctivae are normal. PERRL. EOMI. Head: Atraumatic. Mouth/Throat: Mucous membranes are moist.  Oropharynx non-erythematous. Neck: No stridor.   Cardiovascular: Normal rate, regular rhythm. Good peripheral circulation. Grossly normal heart sounds. Respiratory: Normal respiratory effort.  No retractions. Lungs CTAB. Gastrointestinal: Soft and nontender. No distention.  Musculoskeletal: No lower extremity tenderness nor edema. No gross deformities of extremities. Neurologic:  Normal speech and language. No gross focal neurologic deficits are  appreciated.  Skin:  Skin is warm, dry and intact. No rash noted. Psychiatric: Mood and affect are normal. Speech and behavior are normal.  ____________________________________________   LABS (all labs ordered are listed, but only abnormal results are displayed)  Labs Reviewed  BASIC METABOLIC PANEL - Abnormal; Notable for the following:       Result Value   Potassium 3.4 (*)    Glucose, Bld 126 (*)    All other components within normal limits  CBC - Abnormal; Notable for the following:    RDW 14.7 (*)    All other components within normal limits  TROPONIN I  FIBRIN DERIVATIVES D-DIMER ([facility] ONLY)   ____________________________________________  EKG  ED ECG REPORT I, Shelby N Marta Bouie, the attending physician, personally viewed and interpreted this ECG.   Date: 11/03/2016  EKG  Time: 4:43 PM  Rate: 79  Rhythm: Normal sinus rhythm  Axis: Normal  Intervals: Normal  ST&T Change: None  ____________________________________________  RADIOLOGY I, [place] N Yovanni Frenette, personally viewed and evaluated these images (plain radiographs) as part of my medical decision making, as well as reviewing the written report by the radiologist.  Dg Chest 2 View  Result Date: 11/03/2016 CLINICAL DATA:  Dyspnea EXAM: CHEST  2 VIEW COMPARISON:  12/06/2015 chest radiograph. FINDINGS: Stable cardiomediastinal silhouette with normal heart size. No pneumothorax. No pleural effusion. Lungs appear clear, with no acute consolidative airspace disease and no pulmonary edema. IMPRESSION: No active cardiopulmonary disease. Electronically Signed   By: Ilona Sorrel M.D.   On: 11/03/2016 17:50    Procedures   ____________________________________________   INITIAL IMPRESSION / ASSESSMENT AND PLAN / ED COURSE  Pertinent labs & imaging results that were available during my care of the patient were reviewed by me and considered in my medical decision making (see chart for details).  46 year old male presenting  with brief sharp left-sided chest pain with radiation to his left arm. EKG revealed no evidence of ST segment elevation or depression. Troponin negative 2 d-dimer elevated but CT scan of the chest revealed no evidence of pulmonary emboli. Patient has a appointment scheduled Dr. Saunders Revel cardiologist however I asked that he call Dr. and on Monday and inform them of this episode of chest pain      ____________________________________________  FINAL CLINICAL IMPRESSION(S) / ED DIAGNOSES  Final diagnoses:  Dyspnea  Chest pain  Chest pain, unspecified type     MEDICATIONS GIVEN DURING THIS VISIT:  Medications - No data to display   NEW OUTPATIENT MEDICATIONS STARTED DURING THIS VISIT:  New Prescriptions   No medications on file    Modified Medications   No medications on file    Discontinued Medications   No medications on file     Note:  This document was prepared using Dragon voice recognition software and may include unintentional dictation errors.    Gregor Hams, MD 11/03/16 2300

## 2016-11-14 ENCOUNTER — Ambulatory Visit: Payer: BLUE CROSS/BLUE SHIELD | Admitting: Internal Medicine

## 2016-11-29 ENCOUNTER — Encounter: Payer: Self-pay | Admitting: Family Medicine

## 2016-11-29 ENCOUNTER — Telehealth (INDEPENDENT_AMBULATORY_CARE_PROVIDER_SITE_OTHER): Payer: Self-pay | Admitting: Orthopedic Surgery

## 2016-11-29 NOTE — Telephone Encounter (Signed)
Patient called asked if he can get a Rx for a boot for his right ankle. Patient said his case worker was suppose to call and order it 2 weeks ago. The number to contact patient is [phone]

## 2016-11-29 NOTE — Telephone Encounter (Signed)
Patient states there is no new swelling, it is the same as it has been due to ankle injury. Per Caryl Bis, scheduled patient with Mable Paris NP

## 2016-11-30 ENCOUNTER — Encounter: Payer: Self-pay | Admitting: Family

## 2016-11-30 ENCOUNTER — Ambulatory Visit (INDEPENDENT_AMBULATORY_CARE_PROVIDER_SITE_OTHER): Payer: BLUE CROSS/BLUE SHIELD | Admitting: Family

## 2016-11-30 ENCOUNTER — Telehealth: Payer: Self-pay

## 2016-11-30 ENCOUNTER — Ambulatory Visit
Admission: RE | Admit: 2016-11-30 | Discharge: 2016-11-30 | Disposition: A | Discharge status: Home / Self Care | Payer: BLUE CROSS/BLUE SHIELD | Source: Ambulatory Visit | Attending: Family | Admitting: Family

## 2016-11-30 VITALS — BP 130/90 | HR 67 | Temp 98.3°F | Ht 66.0 in | Wt 221.8 lb

## 2016-11-30 DIAGNOSIS — I10 Essential (primary) hypertension: Secondary | ICD-10-CM | POA: Diagnosis not present

## 2016-11-30 DIAGNOSIS — M25471 Effusion, right ankle: Secondary | ICD-10-CM | POA: Diagnosis not present

## 2016-11-30 LAB — BASIC METABOLIC PANEL
BUN: 14 mg/dL (ref 6–23)
CALCIUM: 9.6 mg/dL (ref 8.4–10.5)
CO2: 26 meq/L (ref 19–32)
Chloride: 108 mEq/L (ref 96–112)
Creatinine, Ser: 0.82 mg/dL (ref 0.40–1.50)
GFR: 106.9 mL/min (ref >60.00)
Glucose, Bld: 111 mg/dL — ABNORMAL HIGH (ref 70–99)
Potassium: 4.3 mEq/L (ref 3.5–5.1)
Sodium: 141 mEq/L (ref 135–145)

## 2016-11-30 NOTE — Progress Notes (Signed)
Pre visit review using our clinic review tool, if applicable. No additional management support is needed unless otherwise documented below in the visit note. 

## 2016-11-30 NOTE — Progress Notes (Signed)
Subjective:    Patient ID: Wesley Bridges, male    DOB: 02/04/1971, 46 y.o.   MRN: [id number]  CC: Wesley Bridges is a 46 y.o. male who presents today for an acute visit.    HPI: CC:   Injured his right ankle Nov 2017 and since then has had leg swelling  In the right. Swelling waxes and wanes, improves with elevation. Swelling worse after walking long distances. Moving around,  No bed rest. Uses crutches. No erythema, increased warmth.   Scheduled for surgery with Antion[facility] Char, Dr Sharol Given.   HTN- compliant with medication. Never increased lisinopril as at home 127/89. Lately states no SOB, palpitations,, chest pain since ER visit 3 weeks ago    Denies exertional chest pain or pressure, numbness or tingling radiating to left arm or jaw, palpitations, dizziness, frequent headaches, changes in vision, or shortness of breath.    ED 5/12 chest pain. CT angio chest negative for PE.  EKG showed ST elevation. Troponin negative. D dimer elevated.  11/03/16 Dr End for palpitations; no ischemia stress echo 2016.  Pending transthoracic echo and 30 holter monitor - delay surgery until done.  Increased lisinopril to 10mg   HISTORY:  Past Medical History:  Diagnosis Date  . Asthma   . Chronic headaches   . GERD (gastroesophageal reflux disease)   . Hyperlipidemia   . Hypertension   . Subclinical hyperthyroidism    Past Surgical History:  Procedure Laterality Date  . ANKLE FRACTURE SURGERY    . ANKLE FUSION    . FEMUR FRACTURE SURGERY    . MANDIBLE FRACTURE SURGERY     Family History  Problem Relation Age of Onset  . Arthritis Unknown        parents  . Hypertension Mother   . Thyroid disease Mother   . Osteoporosis Mother   . Syncope episode Mother   . Hypertension Brother     Allergies: Pollen extract Current Outpatient Prescriptions on File Prior to Visit  Medication Sig Dispense Refill  . acetaminophen (TYLENOL) 500 MG tablet Take 1,000 mg by mouth every 6  (six) hours as needed for headache.     Marland Kitchen aspirin EC 81 MG tablet Take 1 tablet (81 mg total) by mouth daily. 90 tablet 3  . atorvastatin (LIPITOR) 40 MG tablet TAKE 1 TABLET BY MOUTH ONCE DAILY 90 tablet 0  . ibuprofen (ADVIL,MOTRIN) 800 MG tablet TAKE 1 TABLET BY MOUTH 3 TIMES DAY AS NEEDED FOR PAIN  1  . lisinopril (PRINIVIL,ZESTRIL) 10 MG tablet Take 1 tablet (10 mg total) by mouth daily. 90 tablet 3  . loratadine (CLARITIN) 10 MG tablet Take 1 tablet (10 mg total) by mouth daily. (Patient taking differently: Take 20 mg by mouth daily. ) 30 tablet 11  . Multiple Vitamin (MULTIVITAMIN WITH MINERALS) TABS tablet Take 1 tablet by mouth at bedtime.    . pantoprazole (PROTONIX) 40 MG tablet Take 1 tablet (40 mg total) by mouth 2 (two) times daily before a meal. 180 tablet 1  . potassium chloride 20 MEQ TBCR Take 20 mEq by mouth daily. 30 tablet 0  . sertraline (ZOLOFT) 50 MG tablet Take 25 mg (half a tablet) by mouth daily for 7 days, then take 50 mg (one tablet) by mouth daily 30 tablet 3   No current facility-administered medications on file prior to visit.     Social History  Substance Use Topics  . Smoking status: Never Smoker  . Smokeless tobacco: Never Used  .  Alcohol use 8.4 oz/week    14 Cans of beer per week    Review of Systems  Constitutional: Negative for chills and fever.  Respiratory: Negative for cough, shortness of breath and wheezing.   Cardiovascular: Positive for leg swelling. Negative for chest pain and palpitations.  Gastrointestinal: Negative for nausea and vomiting.      Objective:    BP 130/90   Pulse 67   Temp 98.3 F (36.8 C) (Oral)   Ht 5\' 6"  (1.676 m)   Wt 221 lb 12.8 oz (100.6 kg)   SpO2 98%   BMI 35.80 kg/m    Physical Exam  Constitutional: He appears well-developed and well-nourished.  Cardiovascular: Regular rhythm and normal heart sounds.   Trace swelling noted on right lateral ankle ( see diagram). Non pitting. No palpable cords or masses.  No erythema or increased warmth. No asymmetry in calf size when compared bilaterally LE hair growth symmetric and present. No discoloration of varicosities noted. LE warm and palpable pedal pulses.   Pulmonary/Chest: Effort normal and breath sounds normal. No respiratory distress. He has no wheezes. He has no rhonchi. He has no rales.  Musculoskeletal:       Feet:  Trace swelling noted.   Neurological: He is alert.  Skin: Skin is warm and dry.  Psychiatric: He has a normal mood and affect. His speech is normal and behavior is normal.  Vitals reviewed.      Assessment & Plan:   Problem List Items Addressed This Visit      Cardiovascular and Mediastinum   Essential hypertension    Controlled. Patient's is on 5 mg lisinopril. Advised to follow-up PCP for further management      Relevant Orders   Basic metabolic panel     Other   Right ankle swelling - Primary    Swelling is been chronic.CT angio negative PE 10/2016. No SOB, tachypnea.  Awaiting surgery. Based on duration of symptom and elevated d-dimer,pending bilateral US.       Relevant Orders   US Venous Img Lower Unilateral Right   US Venous Img Lower Unilateral Left   US Venous Img Lower Bilateral        I am having Mr. Bridges maintain his aspirin EC, multivitamin with minerals, acetaminophen, Potassium Chloride ER, loratadine, ibuprofen, sertraline, pantoprazole, atorvastatin, and lisinopril.   No orders of the defined types were placed in this encounter.   Return precautions given.   Risks, benefits, and alternatives of the medications and treatment plan prescribed today were discussed, and patient expressed understanding.   Education regarding symptom management and diagnosis given to patient on AVS.  Continue to follow with Leone Haven, MD for routine health maintenance.   Eleftherios A Bridges and I agreed with plan.   Wesley Paris, FNP

## 2016-11-30 NOTE — Assessment & Plan Note (Signed)
Controlled. Patient's is on 5 mg lisinopril. Advised to follow-up PCP for further management

## 2016-11-30 NOTE — Telephone Encounter (Signed)
I called and left voicemail for patient to advise rx for boot for right ankle was left at front desk for pick up. Advised I did not see anything in his chart requesting a fracture boot from his case manager. I saw he was suppose to have surgery but this was cancelled. I'm not too sure what is going on with this patient or why his surgery was cancelled. Maybe you have some insight to this. Otherwise advised that he can pick up the prescription for boot in our office.

## 2016-11-30 NOTE — Telephone Encounter (Signed)
Spoken to provider on phone and was informed to call patient and give results of Ultra sound and lab work. Patient was informed of results.  Patient understood and had no questions, comments, or concerns at this time.

## 2016-11-30 NOTE — Assessment & Plan Note (Signed)
Swelling is been chronic.CT angio negative PE 10/2016. No SOB, tachypnea.  Awaiting surgery. Based on duration of symptom and elevated d-dimer,pending bilateral US.

## 2016-11-30 NOTE — Patient Instructions (Signed)
Ultrasound of bilateral legs.  Labs  Follow up with PCP for blood pressure management.   Pleasure meeting you

## 2016-12-06 ENCOUNTER — Ambulatory Visit (INDEPENDENT_AMBULATORY_CARE_PROVIDER_SITE_OTHER): Payer: BLUE CROSS/BLUE SHIELD

## 2016-12-06 ENCOUNTER — Other Ambulatory Visit: Payer: Self-pay | Admitting: Internal Medicine

## 2016-12-06 ENCOUNTER — Other Ambulatory Visit: Payer: Self-pay

## 2016-12-06 DIAGNOSIS — R0600 Dyspnea, unspecified: Secondary | ICD-10-CM

## 2016-12-06 DIAGNOSIS — R002 Palpitations: Secondary | ICD-10-CM | POA: Diagnosis not present

## 2016-12-06 DIAGNOSIS — R55 Syncope and collapse: Secondary | ICD-10-CM | POA: Diagnosis not present

## 2016-12-13 ENCOUNTER — Telehealth (INDEPENDENT_AMBULATORY_CARE_PROVIDER_SITE_OTHER): Payer: Self-pay

## 2016-12-13 NOTE — Telephone Encounter (Signed)
Received the following email from the case mgr. Sent you the email as well so you could see the pictures she sent... Mr. Adarian Bur is still dealing with cardiac issues and not able to consider the Foot surgery Dr. Sharol Given recommended, until cleared by his Cardiologist. I spoke with Mr. Ander Slade who reports that he went to have the Echo cardiogram on 06 15 18.  He also had blood test (D-Dimer ) which is still elevated; so they did an Ultrasound of the leg to r/o blood clots. So far no blood clots were found, but his ankles are very swollen and bruised. ( see pics attached) Mr. Ander Slade reports that the is having difficulty getting around with the crutches it is affecting the area close to his underarm which is sore. He is wondering if he could get a Surveyor, minerals scooter. And also he is wondering if the boot which is already falling apart could be the reason his foot is ecchymotic. Please ask Dr Sharol Given if he could write the [facility] an order for a knee scooter. In that case he would not need the boot. But if he needs it please have him write an order for an ortho boot as well. The one he have, has fallen apart Thanks again.  Acquanetta Belling RN BSN CCM Medical Case Manager Cell: [phone] Office: [phone] Fax: [phone] Email: [email or web address]

## 2016-12-14 NOTE — Telephone Encounter (Signed)
I emailed Wesley Bridges back and left a voicemail for her. Dr. Sharol Given reviewed email and wants patient to be seen in the office. He did not have ecchymosis at initial evaluation and has not been seen since late March. He needs evaluation to see if something new is going on. I called and spoke with [name] myself and scheduled him an appointment on 12/17/16 Monday at 1230pm. Emailed her appointment date and time.

## 2016-12-17 ENCOUNTER — Ambulatory Visit (INDEPENDENT_AMBULATORY_CARE_PROVIDER_SITE_OTHER): Payer: Worker's Compensation | Admitting: Orthopedic Surgery

## 2016-12-17 ENCOUNTER — Ambulatory Visit (INDEPENDENT_AMBULATORY_CARE_PROVIDER_SITE_OTHER): Payer: BLUE CROSS/BLUE SHIELD | Admitting: Family Medicine

## 2016-12-17 ENCOUNTER — Encounter: Payer: Self-pay | Admitting: Family Medicine

## 2016-12-17 ENCOUNTER — Encounter (INDEPENDENT_AMBULATORY_CARE_PROVIDER_SITE_OTHER): Payer: Self-pay | Admitting: Orthopedic Surgery

## 2016-12-17 ENCOUNTER — Ambulatory Visit (INDEPENDENT_AMBULATORY_CARE_PROVIDER_SITE_OTHER): Payer: Self-pay

## 2016-12-17 VITALS — BP 160/100 | HR 65 | Temp 98.8°F | Wt 219.6 lb

## 2016-12-17 VITALS — Ht 66.0 in | Wt 215.0 lb

## 2016-12-17 DIAGNOSIS — M24671 Ankylosis, right ankle: Secondary | ICD-10-CM

## 2016-12-17 DIAGNOSIS — R252 Cramp and spasm: Secondary | ICD-10-CM | POA: Diagnosis not present

## 2016-12-17 DIAGNOSIS — M25471 Effusion, right ankle: Secondary | ICD-10-CM

## 2016-12-17 DIAGNOSIS — M25571 Pain in right ankle and joints of right foot: Secondary | ICD-10-CM

## 2016-12-17 DIAGNOSIS — I10 Essential (primary) hypertension: Secondary | ICD-10-CM | POA: Diagnosis not present

## 2016-12-17 DIAGNOSIS — M2550 Pain in unspecified joint: Secondary | ICD-10-CM | POA: Diagnosis not present

## 2016-12-17 DIAGNOSIS — M1990 Unspecified osteoarthritis, unspecified site: Secondary | ICD-10-CM | POA: Diagnosis not present

## 2016-12-17 DIAGNOSIS — F321 Major depressive disorder, single episode, moderate: Secondary | ICD-10-CM

## 2016-12-17 MED ORDER — LISINOPRIL 10 MG PO TABS: 10.0000 mg | ORAL_TABLET | Freq: Every day | ORAL | 3 refills | Status: DC

## 2016-12-17 MED ORDER — SERTRALINE HCL 100 MG PO TABS: 100.0000 mg | ORAL_TABLET | Freq: Every day | ORAL | 1 refills | Status: DC

## 2016-12-17 NOTE — Progress Notes (Signed)
Office Visit Note   Patient: Wesley Bridges           Date of Birth: Jul 30, 1970           MRN: [id number] Visit Date: 12/17/2016              Requested by: Leone Haven, MD [address] [place], [place] [address] PCP: Leone Haven, MD  Chief Complaint  Patient presents with  . Right Ankle - Pain      HPI:  Patient is a 46 year old gentleman who is about 20 years out from a fusion of the right ankle. Patient was at work on 04/27/2016 when he stepped on a drain great the grade slipped in his foot fell into the drain sustaining an injury to the subtalar joint. Patient is been a fracture boot since that time and still cannot weight-bear on the right lower extremity still uses crutches.  Assessment & Plan: Visit Diagnoses:  1. Pain in right ankle and joints of right foot   2. Fibrosis of subtalar joint, right     Plan:  Patient is given a note to be out of work for at least 2 months he has a cardiology consult tomorrow once he is cleared from a cardiac standpoint we will plan for posterior arthroscopic subtalar arthrodesis. We will get the patient in fracture boot today. Patient was also given a prescription for a kneeling scooter.  Follow-Up Instructions: Return if symptoms worsen or fail to improve.   Ortho Exam  Patient is alert, oriented, no adenopathy, well-dressed, normal affect, normal respiratory effort. Patient ambulates with crutches.  On examination patient has swelling in the subtalar hindfoot region. Patient is exquisite tenderness to palpation over the sinus Tarsi he does have a old healed oblique incision across the sinus Tarsi. He has pain with attempted subtalar motion is no ankle motion with a stable fusion. Patient has a good dorsalis pedis pulse.  Imaging: Xr Ankle Complete Right  Result Date: 12/17/2016  Of the right foot shows a stable ankle fusion with subtalar arthrosis with bone-on-bone contact.   Labs: No results found  for: HGBA1C, ESRSEDRATE, CRP, LABURIC, REPTSTATUS, GRAMSTAIN, CULT, LABORGA  Orders:  Orders Placed This Encounter  Procedures  . XR Ankle Complete Right   No orders of the defined types were placed in this encounter.    Procedures: No procedures performed  Clinical Data: No additional findings.  ROS:  All other systems negative, except as noted in the HPI. Review of Systems  Objective: Vital Signs: Ht 5\' 6"  (1.676 m)   Wt 215 lb (97.5 kg)   BMI 34.70 kg/m   Specialty Comments:  No specialty comments available.  PMFS History: Patient Active Problem List   Diagnosis Date Noted  . Depression, major, single episode, moderate ([place]) 09/16/2016  . Palpitations 09/16/2016  . Inguinal hernia 09/16/2016  . Fibrosis of subtalar joint, right 07/16/2016  . Acute upper respiratory infection 04/18/2016  . Joint pain 04/18/2016  . Enlarged prostate 03/22/2016  . Lightheadedness 12/15/2015  . Syncope 12/06/2015  . Injury of toe on left foot 10/31/2015  . Right ankle swelling 10/31/2015  . Esophageal reflux 10/31/2015  . Numbness 10/31/2015  . Hyperlipidemia 04/13/2015  . Essential hypertension 03/18/2015  . Headache 03/18/2015  . Chest pain 03/18/2015   Past Medical History:  Diagnosis Date  . Asthma   . Chronic headaches   . GERD (gastroesophageal reflux disease)   . Hyperlipidemia   . Hypertension   .  Subclinical hyperthyroidism     Family History  Problem Relation Age of Onset  . Arthritis Unknown        parents  . Hypertension Mother   . Thyroid disease Mother   . Osteoporosis Mother   . Syncope episode Mother   . Hypertension Brother     Past Surgical History:  Procedure Laterality Date  . ANKLE FRACTURE SURGERY    . ANKLE FUSION    . FEMUR FRACTURE SURGERY    . MANDIBLE FRACTURE SURGERY     Social History   Occupational History  . Not on file.   Social History Main Topics  . Smoking status: Never Smoker  . Smokeless tobacco: Never Used  .  Alcohol use 8.4 oz/week    14 Cans of beer per week  . Drug use: No  . Sexual activity: Not on file

## 2016-12-17 NOTE — Patient Instructions (Signed)
Nice to see you. We will have you start taking 10 mg of lisinopril daily. We'll have you return in 1 week for lab work. We will have you increase your Zoloft to 100 mg daily. Please keep your appointments with cardiology and orthopedics.

## 2016-12-17 NOTE — Assessment & Plan Note (Addendum)
Increase lisinopril to 10 mg. We'll recheck a BMP in 1 week. Advised that he needs to discuss the chest pain he had with his cardiologist tomorrow.

## 2016-12-17 NOTE — Assessment & Plan Note (Signed)
Patient with swelling likely related to arthritic issues in his ankle. He'll continue to follow with orthopedics for this.

## 2016-12-17 NOTE — Assessment & Plan Note (Addendum)
Patient with continued issues with joint pains in his hands. Given family history of rheumatoid arthritis we will check lab work as outlined below. Could consider imaging of his hands in the future. Check lab work for cramps as well.

## 2016-12-17 NOTE — Assessment & Plan Note (Signed)
Not improved. Does note some thoughts of being better off dead though no plan or intent to harm himself. We will increase his Zoloft to 100 mg daily. He'll follow-up in 2 months. If no improvement over the next month he will let us know and we can increase the dose again.

## 2016-12-17 NOTE — Progress Notes (Signed)
[name], MD Phone: [phone]  Wesley Bridges is a 46 y.o. male who presents today for follow-up.  Hypertension: Typically in the 140s/80s-90. No recent chest pain. He was evaluated in the emergency room for sharp chest discomfort. EKG with lateral T wave inversions in the ED though otherwise reassuring workup. No shortness breath or edema. He is taking 5 mg of lisinopril. He was supposed to start on 10 mg lisinopril daily after his last cardiology appointment though did not do this yet. He has a cardiology appointment tomorrow.  Depression: Taking Zoloft 50 mg. Notes no difference. Does note some anxiety. Does note some thoughts of being better off dead though no suicidal thoughts or intent or plan to harm himself.  Right ankle pain: This has been a chronic issue. He is followed by orthopedics. They were planning to do surgery. Notes the area of his right lateral ankle bruises and swells at times. It comes and goes. He wears a boot all the time. Ibuprofen as of little benefit.  Patient additionally notes joint pain in his bilateral hands and wrists for a long time. Notes the right one comes and goes though the left on hurts most of the time. Mostly in the dorsum of his wrist. Does note his mom has a history of rheumatoid arthritis. Patient additionally notes cramps at times in his hands and his legs. Mostly at night.  PMH: nonsmoker.   ROS see history of present illness  Objective  Physical Exam Vitals:   12/17/16 0803  BP: (!) 160/100  Pulse: 65  Temp: 98.8 F (37.1 C)    BP Readings from Last 3 Encounters:  12/17/16 (!) 160/100  11/30/16 130/90  11/03/16 (!) 160/142   Wt Readings from Last 3 Encounters:  12/17/16 219 lb 9.6 oz (99.6 kg)  11/30/16 221 lb 12.8 oz (100.6 kg)  11/03/16 210 lb (95.3 kg)    Physical Exam  Constitutional: No distress.  Cardiovascular: Normal rate, regular rhythm and normal heart sounds.   Pulmonary/Chest: Effort normal and  breath sounds normal.  Musculoskeletal:  Right ankle with swelling and bruising just inferior to the lateral malleolus, there is tenderness over this area and the mid and forefoot, bilateral hands with no joint swelling or inflammatory changes, no tenderness of the joints in his hands or wrists, hands are warm and well perfuse, right foot is warm and well perfused   Neurological: He is alert.  Skin: He is not diaphoretic.     Assessment/Plan: Please see individual problem list.  Essential hypertension Increase lisinopril to 10 mg. We'll recheck a BMP in 1 week. Advised that he needs to discuss the chest pain he had with his cardiologist tomorrow.  Depression, major, single episode, moderate ([place]) Not improved. Does note some thoughts of being better off dead though no plan or intent to harm himself. We will increase his Zoloft to 100 mg daily. He'll follow-up in 2 months. If no improvement over the next month he will let us know and we can increase the dose again.  Joint pain Patient with continued issues with joint pains in his hands. Given family history of rheumatoid arthritis we will check lab work as outlined below. Could consider imaging of his hands in the future. Check lab work for cramps as well.  Right ankle swelling Patient with swelling likely related to arthritic issues in his ankle. He'll continue to follow with orthopedics for this.   Orders Placed This Encounter  Procedures  . Basic Metabolic Panel (  BMET)    Standing Status:   Future    Standing Expiration Date:   12/17/2017  . Magnesium    Standing Status:   Future    Standing Expiration Date:   12/17/2017  . Rheumatoid Factor    Standing Status:   Future    Standing Expiration Date:   12/17/2017  . Antinuclear Antib (ANA)    Standing Status:   Future    Standing Expiration Date:   12/17/2017  . Sed Rate (ESR)    Standing Status:   Future    Standing Expiration Date:   12/17/2017    Meds ordered this encounter    Medications  . sertraline (ZOLOFT) 100 MG tablet    Sig: Take 1 tablet (100 mg total) by mouth daily.    Dispense:  90 tablet    Refill:  1  . lisinopril (PRINIVIL,ZESTRIL) 10 MG tablet    Sig: Take 1 tablet (10 mg total) by mouth daily.    Dispense:  90 tablet    Refill:  [place], MD [place]

## 2016-12-18 ENCOUNTER — Telehealth: Payer: Self-pay | Admitting: *Deleted

## 2016-12-18 ENCOUNTER — Encounter: Payer: Self-pay | Admitting: Internal Medicine

## 2016-12-18 ENCOUNTER — Ambulatory Visit (INDEPENDENT_AMBULATORY_CARE_PROVIDER_SITE_OTHER): Payer: BLUE CROSS/BLUE SHIELD | Admitting: Internal Medicine

## 2016-12-18 VITALS — BP 124/88 | HR 84 | Ht 66.0 in | Wt 216.0 lb

## 2016-12-18 DIAGNOSIS — R002 Palpitations: Secondary | ICD-10-CM | POA: Diagnosis not present

## 2016-12-18 DIAGNOSIS — R079 Chest pain, unspecified: Secondary | ICD-10-CM | POA: Diagnosis not present

## 2016-12-18 DIAGNOSIS — I10 Essential (primary) hypertension: Secondary | ICD-10-CM | POA: Diagnosis not present

## 2016-12-18 MED ORDER — METOPROLOL TARTRATE 25 MG PO TABS: 25.0000 mg | ORAL_TABLET | Freq: Two times a day (BID) | ORAL | 3 refills | Status: DC

## 2016-12-18 MED ORDER — NITROGLYCERIN 0.4 MG SL SUBL: 0.4000 mg | SUBLINGUAL_TABLET | SUBLINGUAL | 3 refills | Status: DC | PRN

## 2016-12-18 NOTE — Patient Instructions (Addendum)
Medication Instructions:  Your physician has recommended you make the following change in your medication:  1- START Metoprolol tartrate 25 mg  (1 tablet) by mouth two times a day. 2- TAKE Nitroglycerin AS NEEDED for chest pain. Take 0.4 mg (1 tablet) dissolved under your tongue every 5 minutes for a maximum of 3 doses. If chest pain, does not go away, please call 911 for emergency evaluation.   Labwork: Your physician recommends that you return for lab work in: TODAY (CBC, BMP, PT/INR, Magnesium).   Testing/Procedures: Your physician has requested that you have a LEFT cardiac catheterization. Cardiac catheterization is used to diagnose and/or treat various heart conditions. Doctors may recommend this procedure for a number of different reasons. The most common reason is to evaluate chest pain. Chest pain can be a symptom of coronary artery disease (CAD), and cardiac catheterization can show whether plaque is narrowing or blocking your heart's arteries. This procedure is also used to evaluate the valves, as well as measure the blood flow and oxygen levels in different parts of your heart. For further information please visit [email or web address]. Please follow instruction sheet, as given.  [facility] Cardiac Cath Instructions   You are scheduled for a Cardiac Cath on:__07/17/18______  Please arrive at _06:30 am__am on the day of your procedure  Please expect a call from our [facility] to pre-register you  Do not eat/drink anything after midnight  Someone will need to drive you home  It is recommended someone be with you for the first 24 hours after your procedure  Wear clothes that are easy to get on/off and wear slip on shoes if possible   Medications bring a current list of all medications with you  _X_ You may take all of your medications (EXCEPT LISINOPRIL) the morning of your procedure with enough water to swallow safely  _X_ Do not take these medications before  your procedure:__Lisinopril_____  Day of your procedure: Arrive at the [facility] [place] entrance.  Free valet service is available.  After entering the [facility] please check-in at the registration desk (1st desk on your right) to receive your armband. After receiving your armband someone will escort you to the cardiac cath/special procedures waiting area.  The usual length of stay after your procedure is about 2 to 3 hours.  This can vary.  If you have any questions, please call our office at [phone], or you may call the cardiac cath lab at [facility] directly at [phone]     Follow-Up: Your physician recommends that you schedule a follow-up appointment to be determined after procedure.   If you need a refill on your cardiac medications before your next appointment, please call your pharmacy.    Coronary Angiogram With Stent Coronary angiogram with stent placement is a procedure to widen or open a narrow blood vessel of the heart (coronary artery). Arteries may become blocked by cholesterol buildup (plaques) in the lining or wall. When a coronary artery becomes partially blocked, blood flow to that area decreases. This may lead to chest pain or a heart attack (myocardial infarction). A stent is a small piece of metal that looks like mesh or a spring. Stent placement may be done as treatment for a heart attack or right after a coronary angiogram in which a blocked artery is found. Let your health care provider know about:  Any allergies you have.  All medicines you are taking, including vitamins, herbs, eye drops, creams, and over-the-counter medicines.  Any problems you or  family members have had with anesthetic medicines.  Any blood disorders you have.  Any surgeries you have had.  Any medical conditions you have.  Whether you are pregnant or may be pregnant. What are the risks? Generally, this is a safe procedure. However, problems may occur, including:  Damage to the  heart or its blood vessels.  A return of blockage.  Bleeding, infection, or bruising at the insertion site.  A collection of blood under the skin (hematoma) at the insertion site.  A blood clot in another part of the body.  Kidney injury.  Allergic reaction to the dye or contrast that is used.  Bleeding into the abdomen (retroperitoneal bleeding).  What happens before the procedure? Staying hydrated Follow instructions from your health care provider about hydration, which may include:  Up to 2 hours before the procedure - you may continue to drink clear liquids, such as water, clear fruit juice, black coffee, and plain tea.  Eating and drinking restrictions Follow instructions from your health care provider about eating and drinking, which may include:  8 hours before the procedure - stop eating heavy meals or foods such as meat, fried foods, or fatty foods.  6 hours before the procedure - stop eating light meals or foods, such as toast or cereal.  2 hours before the procedure - stop drinking clear liquids.  Ask your health care provider about:  Changing or stopping your regular medicines. This is especially important if you are taking diabetes medicines or blood thinners.  Taking medicines such as ibuprofen. These medicines can thin your blood. Do not take these medicines before your procedure if your health care provider instructs you not to. Generally, aspirin is recommended before a procedure of passing a small, thin tube (catheter) through a blood vessel and into the heart (cardiac catheterization).  What happens during the procedure?  An IV tube will be inserted into one of your veins.  You will be given one or more of the following: ? A medicine to help you relax (sedative). ? A medicine to numb the area where the catheter will be inserted into an artery (local anesthetic).  To reduce your risk of infection: ? Your health care team will wash or sanitize their  hands. ? Your skin will be washed with soap. ? Hair may be removed from the area where the catheter will be inserted.  Using a guide wire, the catheter will be inserted into an artery. The location may be in your groin, in your wrist, or in the fold of your arm (near your elbow).  A type of X-ray (fluoroscopy) will be used to help guide the catheter to the opening of the arteries in the heart.  A dye will be injected into the catheter, and X-rays will be taken. The dye will help to show where any narrowing or blockages are located in the arteries.  A tiny wire will be guided to the blocked spot, and a balloon will be inflated to make the artery wider.  The stent will be expanded and will crush the plaques into the wall of the vessel. The stent will hold the area open and improve the blood flow. Most stents have a drug coating to reduce the risk of the stent narrowing over time.  The artery may be made wider using a drill, laser, or other tools to remove plaques.  When the blood flow is better, the catheter will be removed. The lining of the artery will grow over the  stent, which stays where it was placed. This procedure may vary among health care providers and hospitals. What happens after the procedure?  If the procedure is done through the leg, you will be kept in bed lying flat for about 6 hours. You will be instructed to not bend and not cross your legs.  The insertion site will be checked frequently.  The pulse in your foot or wrist will be checked frequently.  You may have additional blood tests, X-rays, and a test that records the electrical activity of your heart (electrocardiogram, or ECG). This information is not intended to replace advice given to you by your health care provider. Make sure you discuss any questions you have with your health care provider. Document Released: 12/16/2002 Document Revised: 02/09/2016 Document Reviewed: 01/15/2016 Elsevier Interactive Patient  Education  2017 [employer].

## 2016-12-18 NOTE — Telephone Encounter (Signed)
Patient scheduled for left heart cath on 01/08/17 at 0730, arrival 6:30 am. Patient aware of date of procedure but needed to know the time once officially scheduled.  No answer, left message to call back to receive information.
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
## 2016-12-19 ENCOUNTER — Other Ambulatory Visit: Payer: Self-pay | Admitting: Family Medicine

## 2016-12-19 LAB — BASIC METABOLIC PANEL
BUN / CREAT RATIO: 19 (ref 9–20)
BUN: 15 mg/dL (ref 6–24)
CO2: 24 mmol/L (ref 20–29)
CREATININE: 0.8 mg/dL (ref 0.76–1.27)
Calcium: 10.2 mg/dL (ref 8.7–10.2)
Chloride: 101 mmol/L (ref 96–106)
GFR, EST AFRICAN AMERICAN: 123 mL/min/{1.73_m2} (ref >59)
GFR, EST NON AFRICAN AMERICAN: 106 mL/min/{1.73_m2} (ref >59)
Glucose: 96 mg/dL (ref 65–99)
Potassium: 4.4 mmol/L (ref 3.5–5.2)
SODIUM: 139 mmol/L (ref 134–144)

## 2016-12-19 LAB — CBC WITH DIFFERENTIAL/PLATELET
BASOS: 1 %
Basophils Absolute: 0.1 10*3/uL (ref 0.0–0.2)
EOS (ABSOLUTE): 0.2 10*3/uL (ref 0.0–0.4)
Eos: 3 %
HEMATOCRIT: 44.6 % (ref 37.5–51.0)
Hemoglobin: 15.2 g/dL (ref 13.0–17.7)
Immature Grans (Abs): 0 10*3/uL (ref 0.0–0.1)
Immature Granulocytes: 0 %
LYMPHS: 40 %
Lymphocytes Absolute: 3 10*3/uL (ref 0.7–3.1)
MCH: 30.3 pg (ref 26.6–33.0)
MCHC: 34.1 g/dL (ref 31.5–35.7)
MCV: 89 fL (ref 79–97)
MONOS ABS: 0.6 10*3/uL (ref 0.1–0.9)
Monocytes: 9 %
Neutrophils Absolute: 3.5 10*3/uL (ref 1.4–7.0)
Neutrophils: 47 %
Platelets: 320 10*3/uL (ref 150–379)
RBC: 5.01 x10E6/uL (ref 4.14–5.80)
RDW: 14.3 % (ref 12.3–15.4)
WBC: 7.3 10*3/uL (ref 3.4–10.8)

## 2016-12-19 LAB — PROTIME-INR
INR: 0.9 (ref 0.8–1.2)
Prothrombin Time: 10.1 s (ref 9.1–12.0)

## 2016-12-19 LAB — MAGNESIUM: Magnesium: 2.2 mg/dL (ref 1.6–2.3)

## 2016-12-19 MED ORDER — ATORVASTATIN CALCIUM 40 MG PO TABS: 40.0000 mg | ORAL_TABLET | Freq: Every day | ORAL | 0 refills | Status: DC

## 2016-12-19 NOTE — Telephone Encounter (Signed)
S/w patient and he verbalized understanding to arrive at 6:30 am on 01/08/17 for his left heart catheterization.

## 2016-12-24 ENCOUNTER — Other Ambulatory Visit (INDEPENDENT_AMBULATORY_CARE_PROVIDER_SITE_OTHER): Payer: BLUE CROSS/BLUE SHIELD

## 2016-12-24 ENCOUNTER — Ambulatory Visit (INDEPENDENT_AMBULATORY_CARE_PROVIDER_SITE_OTHER): Payer: BLUE CROSS/BLUE SHIELD | Admitting: Family

## 2016-12-24 ENCOUNTER — Telehealth: Payer: Self-pay | Admitting: *Deleted

## 2016-12-24 ENCOUNTER — Telehealth: Payer: Self-pay | Admitting: Family Medicine

## 2016-12-24 VITALS — BP 140/70 | HR 55 | Temp 98.1°F | Ht 66.0 in | Wt 221.0 lb

## 2016-12-24 DIAGNOSIS — M1990 Unspecified osteoarthritis, unspecified site: Secondary | ICD-10-CM | POA: Diagnosis not present

## 2016-12-24 DIAGNOSIS — I10 Essential (primary) hypertension: Secondary | ICD-10-CM | POA: Diagnosis not present

## 2016-12-24 DIAGNOSIS — R252 Cramp and spasm: Secondary | ICD-10-CM | POA: Diagnosis not present

## 2016-12-24 LAB — MAGNESIUM: Magnesium: 1.8 mg/dL (ref 1.5–2.5)

## 2016-12-24 LAB — SEDIMENTATION RATE: SED RATE: 24 mm/h — AB (ref 0–15)

## 2016-12-24 LAB — BASIC METABOLIC PANEL
BUN: 12 mg/dL (ref 6–23)
CHLORIDE: 108 meq/L (ref 96–112)
CO2: 25 mEq/L (ref 19–32)
Calcium: 9.1 mg/dL (ref 8.4–10.5)
Creatinine, Ser: 0.86 mg/dL (ref 0.40–1.50)
GFR: 101.15 mL/min (ref >60.00)
Glucose, Bld: 165 mg/dL — ABNORMAL HIGH (ref 70–99)
POTASSIUM: 4.1 meq/L (ref 3.5–5.1)
SODIUM: 141 meq/L (ref 135–145)

## 2016-12-24 NOTE — Progress Notes (Signed)
Pre visit review using our clinic review tool, if applicable. No additional management support is needed unless otherwise documented below in the visit note. 

## 2016-12-24 NOTE — Telephone Encounter (Signed)
Per [facility] note was advised to go to ED, at this time, not currently in ED yet, see additional note for details. thanks

## 2016-12-24 NOTE — Telephone Encounter (Addendum)
Talked with [patient refused ED , patient agreed to see NP today at 4 PM , spoke with NP stated she would see patient. Patient added to schedule PCP verbally notified.

## 2016-12-24 NOTE — Telephone Encounter (Signed)
[facility] Patient Name: Wesley Bridges DOB: [birth year] Initial Comment Caller states his BP is 160/113, light headed and doesn't feel good. Nurse Assessment Nurse: Markus Daft, RN, Sherre Poot Date/Time (Eastern Time): 12/24/2016 2:27:32 PM Confirm and document reason for call. If symptomatic, describe symptoms. ---Caller states his BP is 160/113, c/o lightheaded and doesn't feel good. S/S started Saturday. Does the patient have any new or worsening symptoms? ---Yes Will a triage be completed? ---Yes Related visit to physician within the last 2 weeks? ---Yes Does the PT have any chronic conditions? (i.e. diabetes, asthma, etc.) ---Yes List chronic conditions. ---HTN Is this a behavioral health or substance abuse call? ---No Guidelines Guideline Title Affirmed Question Affirmed Notes High Blood Pressure [0] Systolic BP >= 786 OR Diastolic >= 754 AND [4] cardiac or neurologic symptoms (e.g., chest pain, difficulty breathing, unsteady gait, blurred vision) Final Disposition User Go to ED Now Markus Daft, RN, Windy Comments Dose increased at his last visit. Rates HA at 3-4/10 currently.  Unsteady gait. Referrals [facility] - ED Disagree/Comply: Comply

## 2016-12-24 NOTE — Patient Instructions (Signed)
Plenty of fluid  As discussed, most concerned with vision changes, dizziness. We discussed at length going to the emergency room so you could have a CT scan of head. Since you prefer to see how metoprolol decrease effects you, please stay VERY vigilant and any new, worsening symptoms overnight, please do to emergency room  Let us know how you are doing tomorrow.   Suspect orthostatic.   Take metoprolol once per day.   Monitor BP  Follow up with [place] later this week.    Orthostatic Hypotension Orthostatic hypotension is a sudden drop in blood pressure that happens when you quickly change positions, such as when you get up from a seated or lying position. Blood pressure is a measurement of how strongly, or weakly, your blood is pressing against the walls of your arteries. Arteries are blood vessels that carry blood from your heart throughout your body. When blood pressure is too low, you may not get enough blood to your brain or to the rest of your organs. This can cause weakness, light-headedness, rapid heartbeat, and fainting. This can last for just a few seconds or for up to a few minutes. Orthostatic hypotension is usually not a serious problem. However, if it happens frequently or gets worse, it may be a sign of something more serious. What are the causes? This condition may be caused by:  Sudden changes in posture, such as standing up quickly after you have been sitting or lying down.  Blood loss.  Loss of body fluids (dehydration).  Heart problems.  Hormone (endocrine) problems.  Pregnancy.  Severe infection.  Lack of certain nutrients.  Severe allergic reactions (anaphylaxis).  Certain medicines, such as blood pressure medicine or medicines that make the body lose excess fluids (diuretics). Sometimes, this condition can be caused by not taking medicine as directed, such as taking too much of a certain medicine.  What increases the risk? Certain factors can make  you more likely to develop orthostatic hypotension, including:  Age. Risk increases as you get older.  Conditions that affect the heart or the central nervous system.  Taking certain medicines, such as blood pressure medicine or diuretics.  Being pregnant.  What are the signs or symptoms? Symptoms of this condition may include:  Weakness.  Light-headedness.  Dizziness.  Blurred vision.  Fatigue.  Rapid heartbeat.  Fainting, in severe cases.  How is this diagnosed? This condition is diagnosed based on:  Your medical history.  Your symptoms.  Your blood pressure measurement. Your health care provider will check your blood pressure when you are: ? Lying down. ? Sitting. ? Standing.  A blood pressure reading is recorded as two numbers, such as "120 over 80" (or 120/80). The first ("top") number is called the systolic pressure. It is a measure of the pressure in your arteries as your heart beats. The second ("bottom") number is called the diastolic pressure. It is a measure of the pressure in your arteries when your heart relaxes between beats. Blood pressure is measured in a unit called mm Hg. Healthy blood pressure for adults is 120/80. If your blood pressure is below 90/60, you may be diagnosed with hypotension. Other information or tests that may be used to diagnose orthostatic hypotension include:  Your other vital signs, such as your heart rate and temperature.  Blood tests.  Tilt table test. For this test, you will be safely secured to a table that moves you from a lying position to an upright position. Your heart rhythm and  blood pressure will be monitored during the test.  How is this treated? Treatment for this condition may include:  Changing your diet. This may involve eating more salt (sodium) or drinking more water.  Taking medicines to raise your blood pressure.  Changing the dosage of certain medicines you are taking that might be lowering your blood  pressure.  Wearing compression stockings. These stockings help to prevent blood clots and reduce swelling in your legs.  In some cases, you may need to go to the hospital for:  Fluid replacement. This means you will receive fluids through an IV tube.  Blood replacement. This means you will receive donated blood through an IV tube (transfusion).  Treating an infection or heart problems, if this applies.  Monitoring. You may need to be monitored while medicines that you are taking wear off.  Follow these instructions at home: Eating and drinking   Drink enough fluid to keep your urine clear or pale yellow.  Eat a healthy diet and follow instructions from your health care provider about eating or drinking restrictions. A healthy diet includes: ? Fresh fruits and vegetables. ? Whole grains. ? Lean meats. ? Low-fat dairy products.  Eat extra salt only as directed. Do not add extra salt to your diet unless your health care provider told you to do that.  Eat frequent, small meals.  Avoid standing up suddenly after eating. Medicines  Take over-the-counter and prescription medicines only as told by your health care provider. ? Follow instructions from your health care provider about changing the dosage of your current medicines, if this applies. ? Do not stop or adjust any of your medicines on your own. General instructions  Wear compression stockings as told by your health care provider.  Get up slowly from lying down or sitting positions. This gives your blood pressure a chance to adjust.  Avoid hot showers and excessive heat as directed by your health care provider.  Return to your normal activities as told by your health care provider. Ask your health care provider what activities are safe for you.  Do not use any products that contain nicotine or tobacco, such as cigarettes and e-cigarettes. If you need help quitting, ask your health care provider.  Keep all follow-up  visits as told by your health care provider. This is important. Contact a health care provider if:  You vomit.  You have diarrhea.  You have a fever for more than 2-3 days.  You feel more thirsty than usual.  You feel weak and tired. Get help right away if:  You have chest pain.  You have a fast or irregular heartbeat.  You develop numbness in any part of your body.  You cannot move your arms or your legs.  You have trouble speaking.  You become sweaty or feel lightheaded.  You faint.  You feel short of breath.  You have trouble staying awake.  You feel confused. This information is not intended to replace advice given to you by your health care provider. Make sure you discuss any questions you have with your health care provider. Document Released: 06/01/2002 Document Revised: 02/28/2016 Document Reviewed: 12/02/2015 Elsevier Interactive Patient Education  2018 [employer].

## 2016-12-24 NOTE — Assessment & Plan Note (Addendum)
Reassured the patient is normotensive in my office today. Do note Bradycardia which suspect is contributing to symptoms. Orthostatic ( see flow sheet). I redid orthostatics myself - Sitting 130/100; Standing 120/80. Suspect from metoprolol.  EKG shows no acute changes. When compared to EKG 6/26, no significant changes. sinus rhythm. Reassured by normal neurologic exam. However I'm concerned by patient's description of vision changes in context of headache, elevated blood pressure at home.  Spoke at great length both wife and husband; I have advised them both to go to the emergency room as he may need imaging of his head tonight. Patient was very polite and declined. He said he would like to trial cutting back on the metoprolol once daily dosing and give [place] a call tomorrow and let us know how is doing. We jointly agreed on very close follow-up he will make follow-up appointment with PCP for the end of this week.

## 2016-12-24 NOTE — Telephone Encounter (Signed)
Patient is currently having elevated blood pressure ranging around 160/113.  Pt was transferred to [facility]  Pt contact [phone]

## 2016-12-24 NOTE — Progress Notes (Signed)
Subjective:    Patient ID: Wesley Bridges, male    DOB: 1971-03-16, 46 y.o.   MRN: [id number]  CC: [name] is a 46 y.o. male who presents today for an acute visit.    HPI: CC: elevated blood pressure 160/119 at home today x 3 days, unchanged.  Notes 'mild' headache and feels 'like flashing in both eyes' for 3 days. No double vision. Wears glasses and having to wear them 'more often'.  Feels lightheaded, dizzy. No syncope.  Taking metoprolol for palpitations, atrial flutter ( see Dr Darnelle Bos note). Started one week ago. Hasn't taken nitro and denies cp.   Denies exertional chest pain or pressure, numbness or tingling radiating to left arm or jaw, palpitations, dizziness,  changes in vision, or shortness of breath.    ED for CP 10/2016; CTA unrevealing  11/2016 Dr End ; started on metoprolol, nitro prn. BP 124/88, HR 84 during OV. Ordered cardiac cath ( 12/2016 scheduled)  Negative stress echo 03/2015        HISTORY:  Past Medical History:  Diagnosis Date  . Asthma   . Chronic headaches   . GERD (gastroesophageal reflux disease)   . Hyperlipidemia   . Hypertension   . Subclinical hyperthyroidism    Past Surgical History:  Procedure Laterality Date  . ANKLE FRACTURE SURGERY    . ANKLE FUSION    . FEMUR FRACTURE SURGERY    . MANDIBLE FRACTURE SURGERY     Family History  Problem Relation Age of Onset  . Arthritis Unknown        parents  . Hypertension Mother   . Thyroid disease Mother   . Osteoporosis Mother   . Syncope episode Mother   . Hypertension Brother     Allergies: Pollen extract Current Outpatient Prescriptions on File Prior to Visit  Medication Sig Dispense Refill  . acetaminophen (TYLENOL) 500 MG tablet Take 1,000 mg by mouth every 6 (six) hours as needed for headache.     Marland Kitchen aspirin EC 81 MG tablet Take 1 tablet (81 mg total) by mouth daily. 90 tablet 3  . atorvastatin (LIPITOR) 40 MG tablet Take 1 tablet (40 mg total) by mouth  daily. 90 tablet 0  . ibuprofen (ADVIL,MOTRIN) 800 MG tablet TAKE 1 TABLET BY MOUTH 3 TIMES DAY AS NEEDED FOR PAIN  1  . lisinopril (PRINIVIL,ZESTRIL) 10 MG tablet Take 1 tablet (10 mg total) by mouth daily. 90 tablet 3  . loratadine (CLARITIN) 10 MG tablet Take 1 tablet (10 mg total) by mouth daily. (Patient taking differently: Take 20 mg by mouth daily. ) 30 tablet 11  . metoprolol tartrate (LOPRESSOR) 25 MG tablet Take 1 tablet (25 mg total) by mouth 2 (two) times daily. 180 tablet 3  . Multiple Vitamin (MULTIVITAMIN WITH MINERALS) TABS tablet Take 1 tablet by mouth at bedtime.    . nitroGLYCERIN (NITROSTAT) 0.4 MG SL tablet Place 1 tablet (0.4 mg total) under the tongue every 5 (five) minutes as needed for chest pain. Maximum of 3 doses. 35 tablet 3  . pantoprazole (PROTONIX) 40 MG tablet Take 1 tablet (40 mg total) by mouth 2 (two) times daily before a meal. 180 tablet 1  . potassium chloride 20 MEQ TBCR Take 20 mEq by mouth daily. 30 tablet 0  . sertraline (ZOLOFT) 100 MG tablet Take 1 tablet (100 mg total) by mouth daily. 90 tablet 1   No current facility-administered medications on file prior to visit.  Social History  Substance Use Topics  . Smoking status: Never Smoker  . Smokeless tobacco: Never Used  . Alcohol use 8.4 oz/week    14 Cans of beer per week    Review of Systems  Constitutional: Negative for chills and fever.  Eyes: Positive for visual disturbance.  Respiratory: Negative for cough.   Cardiovascular: Negative for chest pain and palpitations.  Gastrointestinal: Negative for nausea and vomiting.  Neurological: Positive for dizziness and headaches. Negative for syncope and weakness.      Objective:    BP 140/70   Pulse (!) 55   Temp 98.1 F (36.7 C) (Oral)   Ht 5\' 6"  (1.676 m)   Wt 221 lb (100.2 kg)   SpO2 98%   BMI 35.67 kg/m    Physical Exam  Constitutional: He appears well-developed and well-nourished.  HENT:  Right Ear: Hearing normal.  Left  Ear: Hearing normal.  Mouth/Throat: Uvula is midline, oropharynx is clear and moist and mucous membranes are normal. No posterior oropharyngeal edema or posterior oropharyngeal erythema.  Eyes: Conjunctivae, EOM and lids are normal. Pupils are equal, round, and reactive to light. Lids are everted and swept, no foreign bodies found.  Normal fundus bilaterally.  Cardiovascular: Regular rhythm and normal heart sounds.   Pulmonary/Chest: Effort normal and breath sounds normal. No respiratory distress. He has no wheezes. He has no rhonchi. He has no rales.  Lymphadenopathy:       Head (right side): No submental, no submandibular, no tonsillar, no preauricular, no posterior auricular and no occipital adenopathy present.       Head (left side): No submental, no submandibular, no tonsillar, no preauricular, no posterior auricular and no occipital adenopathy present.    He has no cervical adenopathy.  Neurological: He is alert. He has normal strength. No cranial nerve deficit or sensory deficit. He displays a negative Romberg sign.  Reflex Scores:      Bicep reflexes are 2+ on the right side and 2+ on the left side.      Patellar reflexes are 2+ on the right side and 2+ on the left side. Grip equal and strong bilateral upper extremities. Gait strong and steady. Able to perform rapid alternating movement without difficulty.  Skin: Skin is warm and dry.  Psychiatric: He has a normal mood and affect. His speech is normal and behavior is normal.  Vitals reviewed.      Assessment & Plan:   Problem List Items Addressed This Visit      Cardiovascular and Mediastinum   Essential hypertension - Primary    Reassured the patient is normotensive in my office today. Do note Bradycardia which suspect is contributing to symptoms. Orthostatic ( see flow sheet). I redid orthostatics myself - Sitting 130/100; Standing 120/80. Suspect from metoprolol.  EKG shows no acute changes. When compared to EKG 6/26, no  significant changes. sinus rhythm. Reassured by normal neurologic exam. However I'm concerned by patient's description of vision changes in context of headache, elevated blood pressure at home.  Spoke at great length both wife and husband; I have advised them both to go to the emergency room as he may need imaging of his head tonight. Patient was very polite and declined. He said he would like to trial cutting back on the metoprolol once daily dosing and give [place] a call tomorrow and let us know how is doing. We jointly agreed on very close follow-up he will make follow-up appointment with PCP for the end of this  week.      Relevant Orders   EKG 12-Lead (Completed)        I am having [name] maintain his aspirin EC, multivitamin with minerals, acetaminophen, Potassium Chloride ER, loratadine, ibuprofen, pantoprazole, sertraline, lisinopril, metoprolol tartrate, nitroGLYCERIN, and atorvastatin.   No orders of the defined types were placed in this encounter.   Return precautions given.   Risks, benefits, and alternatives of the medications and treatment plan prescribed today were discussed, and patient expressed understanding.   Education regarding symptom management and diagnosis given to patient on AVS.  Continue to follow with Leone Haven, MD for routine health maintenance.   Tito A [name] and I agreed with plan.   Mable Paris, FNP

## 2016-12-25 ENCOUNTER — Other Ambulatory Visit: Payer: Self-pay | Admitting: Family Medicine

## 2016-12-25 ENCOUNTER — Encounter: Payer: Self-pay | Admitting: Family

## 2016-12-25 ENCOUNTER — Telehealth: Payer: Self-pay

## 2016-12-25 DIAGNOSIS — R7309 Other abnormal glucose: Secondary | ICD-10-CM

## 2016-12-25 LAB — ANA: Anti Nuclear Antibody(ANA): NEGATIVE

## 2016-12-25 LAB — RHEUMATOID FACTOR: Rhuematoid fact SerPl-aCnc: <14 IU/mL (ref <14)

## 2016-12-25 NOTE — Telephone Encounter (Signed)
Please have Lorriane Shire combine two 15 minute appointments appointments Friday morning for the patient. Thanks.

## 2016-12-25 NOTE — Telephone Encounter (Signed)
Please advise 

## 2016-12-25 NOTE — Telephone Encounter (Signed)
The patient has been scheduled and notified he will be here on Friday 7.6.18.

## 2016-12-25 NOTE — Telephone Encounter (Signed)
Patient states he saw Joycelyn Schmid on 12/24/16 for elevated bp, it was 140/70 in the office. Patient states he was told to follow up with you by Friday, we do not have any openings. When does patient need to be scheduled. He is scheduled for bp check with nurse 01/16/17

## 2016-12-27 NOTE — Progress Notes (Signed)
Patient stated the [place] have gotten better and has a follow up tomorrow with PCP.

## 2016-12-28 ENCOUNTER — Ambulatory Visit (INDEPENDENT_AMBULATORY_CARE_PROVIDER_SITE_OTHER): Payer: BLUE CROSS/BLUE SHIELD | Admitting: Family Medicine

## 2016-12-28 ENCOUNTER — Encounter: Payer: Self-pay | Admitting: Family Medicine

## 2016-12-28 VITALS — BP 130/100 | HR 61 | Temp 98.6°F | Ht 66.0 in | Wt 219.0 lb

## 2016-12-28 DIAGNOSIS — R42 Dizziness and giddiness: Secondary | ICD-10-CM

## 2016-12-28 DIAGNOSIS — I10 Essential (primary) hypertension: Secondary | ICD-10-CM

## 2016-12-28 MED ORDER — LISINOPRIL 20 MG PO TABS: 20.0000 mg | ORAL_TABLET | Freq: Every day | ORAL | 3 refills | Status: DC

## 2016-12-28 NOTE — Progress Notes (Signed)
  [name], MD Phone: [phone]  Wesley Bridges is a 46 y.o. male who presents today for f/u.  HYPERTENSION  Disease Monitoring  Home BP Monitoring 140/93 this morning Chest pain- no    Dyspnea- no Medications  Compliance-  taking lisinopril, metoprolol. Lightheadedness-  yes  Edema- no Patient does note some lightheadedness at times. Also notes some mild headache that feels though he has slept too much. He notes this is similar to when he was seen earlier this week. He notes he's had no vision issues since being seen earlier this week. No numbness or weakness. Does note his symptoms overall have improved to some degree as his blood pressure has come down.    ROS see history of present illness  Objective  Physical Exam Vitals:   12/28/16 0956  BP: (!) 130/100  Pulse: 61  Temp: 98.6 F (37 C)   Orthostatic VS for the past 24 hrs:  BP- Lying Pulse- Lying BP- Sitting Pulse- Sitting BP- Standing at 0 minutes Pulse- Standing at 0 minutes  12/28/16 1022 (!) 144/96 60 (!) 142/104 55 (!) 130/100 64    BP Readings from Last 3 Encounters:  12/28/16 (!) 130/100  12/24/16 140/70  12/18/16 124/88   Wt Readings from Last 3 Encounters:  12/28/16 219 lb (99.3 kg)  12/24/16 221 lb (100.2 kg)  12/18/16 216 lb (98 kg)    Physical Exam  Constitutional: No distress.  HENT:  Head: Normocephalic and atraumatic.  Mouth/Throat: Oropharynx is clear and moist. No oropharyngeal exudate.  Eyes: Conjunctivae are normal. Pupils are equal, round, and reactive to light.  Cardiovascular: Normal rate, regular rhythm and normal heart sounds.   Pulmonary/Chest: Effort normal and breath sounds normal.  Musculoskeletal: He exhibits no edema.  Neurological: He is alert. Gait normal.  CN 2-12 intact, 5/5 strength in bilateral biceps, triceps, grip, quads, hamstrings, plantar and dorsiflexion, sensation to light touch intact in bilateral UE and LE  Skin: Skin is warm and dry. He is not  diaphoretic.     Assessment/Plan: Please see individual problem list.  Essential hypertension Diastolic blood pressure actually worse than prior. Suspect headache is related to blood pressure. He is neurologically intact. We will increase his lisinopril. We'll check labs in 1 week. Given return precautions.  Lightheadedness Intermittent lightheadedness. Potentially could be related to orthostasis. Blood pressures have been more elevated than typical at home. Encouraged hydration. We'll monitor.   Orders Placed This Encounter  Procedures  . Basic metabolic panel    Standing Status:   Future    Standing Expiration Date:   12/28/2017    Meds ordered this encounter  Medications  . lisinopril (PRINIVIL,ZESTRIL) 20 MG tablet    Sig: Take 1 tablet (20 mg total) by mouth daily.    Dispense:  90 tablet    Refill:  [place], MD [place]

## 2016-12-28 NOTE — Patient Instructions (Signed)
Nice to see you. We will increase you lisinopril to 20 mg daily. Please monitor your symptoms and if they do not improve with improving blood pressure please let us know. If you develop worsening headache, numbness, weakness, vision changes, chest pain, shortness breath, or any new or changing symptoms please seek medical attention medially. We'll have you return in 1 week for nurse visit for blood pressure check.

## 2016-12-28 NOTE — Progress Notes (Signed)
Pre visit review using our clinic review tool, if applicable. No additional management support is needed unless otherwise documented below in the visit note. 

## 2016-12-28 NOTE — Assessment & Plan Note (Signed)
Diastolic blood pressure actually worse than prior. Suspect headache is related to blood pressure. He is neurologically intact. We will increase his lisinopril. We'll check labs in 1 week. Given return precautions.

## 2016-12-28 NOTE — Assessment & Plan Note (Signed)
Intermittent lightheadedness. Potentially could be related to orthostasis. Blood pressures have been more elevated than typical at home. Encouraged hydration. We'll monitor.

## 2017-01-03 ENCOUNTER — Telehealth: Payer: Self-pay | Admitting: *Deleted

## 2017-01-03 DIAGNOSIS — Z01810 Encounter for preprocedural cardiovascular examination: Secondary | ICD-10-CM

## 2017-01-03 DIAGNOSIS — Z01818 Encounter for other preprocedural examination: Secondary | ICD-10-CM

## 2017-01-03 DIAGNOSIS — R079 Chest pain, unspecified: Secondary | ICD-10-CM

## 2017-01-03 NOTE — Telephone Encounter (Signed)
Patient is scheduled for heart cath on 01/08/17 and needs to get pre- cath labs (BMP, CBC, Pt/INR). Spoke with patient and he said he could not go today but will be able to go tomorrow for the labs.

## 2017-01-04 ENCOUNTER — Other Ambulatory Visit
Admission: RE | Admit: 2017-01-04 | Discharge: 2017-01-04 | Disposition: A | Discharge status: Home / Self Care | Payer: BLUE CROSS/BLUE SHIELD | Source: Ambulatory Visit | Attending: Internal Medicine | Admitting: Internal Medicine

## 2017-01-04 DIAGNOSIS — R079 Chest pain, unspecified: Secondary | ICD-10-CM | POA: Diagnosis present

## 2017-01-04 DIAGNOSIS — Z01810 Encounter for preprocedural cardiovascular examination: Secondary | ICD-10-CM | POA: Diagnosis present

## 2017-01-04 DIAGNOSIS — Z01818 Encounter for other preprocedural examination: Secondary | ICD-10-CM | POA: Diagnosis present

## 2017-01-04 LAB — BASIC METABOLIC PANEL
ANION GAP: 6 (ref 5–15)
BUN: 15 mg/dL (ref 6–20)
CHLORIDE: 106 mmol/L (ref 101–111)
CO2: 25 mmol/L (ref 22–32)
CREATININE: 0.82 mg/dL (ref 0.61–1.24)
Calcium: 9.4 mg/dL (ref 8.9–10.3)
GFR calc non Af Amer: >60 mL/min (ref >60)
Glucose, Bld: 96 mg/dL (ref 65–99)
POTASSIUM: 4 mmol/L (ref 3.5–5.1)
SODIUM: 137 mmol/L (ref 135–145)

## 2017-01-04 LAB — CBC WITH DIFFERENTIAL/PLATELET
Basophils Absolute: 0.1 10*3/uL (ref 0–0.1)
Basophils Relative: 1 %
EOS ABS: 0.3 10*3/uL (ref 0–0.7)
EOS PCT: 3 %
HCT: 40.6 % (ref 40.0–52.0)
Hemoglobin: 14 g/dL (ref 13.0–18.0)
LYMPHS ABS: 4.1 10*3/uL — AB (ref 1.0–3.6)
Lymphocytes Relative: 41 %
MCH: 30.6 pg (ref 26.0–34.0)
MCHC: 34.6 g/dL (ref 32.0–36.0)
MCV: 88.5 fL (ref 80.0–100.0)
MONO ABS: 0.8 10*3/uL (ref 0.2–1.0)
MONOS PCT: 8 %
Neutro Abs: 4.6 10*3/uL (ref 1.4–6.5)
Neutrophils Relative %: 47 %
PLATELETS: 285 10*3/uL (ref 150–440)
RBC: 4.59 MIL/uL (ref 4.40–5.90)
RDW: 14.2 % (ref 11.5–14.5)
WBC: 9.9 10*3/uL (ref 3.8–10.6)

## 2017-01-04 LAB — PROTIME-INR
INR: 0.97
PROTHROMBIN TIME: 12.9 s (ref 11.4–15.2)

## 2017-01-06 ENCOUNTER — Other Ambulatory Visit: Payer: Self-pay | Admitting: Internal Medicine

## 2017-01-08 ENCOUNTER — Encounter
Admission: RE | Disposition: A | Discharge status: Home / Self Care | Payer: Self-pay | Source: Ambulatory Visit | Attending: Internal Medicine

## 2017-01-08 ENCOUNTER — Ambulatory Visit
Admission: RE | Admit: 2017-01-08 | Discharge: 2017-01-08 | Disposition: A | Discharge status: Home / Self Care | Payer: BLUE CROSS/BLUE SHIELD | Source: Ambulatory Visit | Attending: Internal Medicine | Admitting: Internal Medicine

## 2017-01-08 DIAGNOSIS — I10 Essential (primary) hypertension: Secondary | ICD-10-CM | POA: Insufficient documentation

## 2017-01-08 DIAGNOSIS — E785 Hyperlipidemia, unspecified: Secondary | ICD-10-CM | POA: Diagnosis not present

## 2017-01-08 DIAGNOSIS — Z7982 Long term (current) use of aspirin: Secondary | ICD-10-CM | POA: Insufficient documentation

## 2017-01-08 DIAGNOSIS — K219 Gastro-esophageal reflux disease without esophagitis: Secondary | ICD-10-CM | POA: Diagnosis not present

## 2017-01-08 DIAGNOSIS — R079 Chest pain, unspecified: Secondary | ICD-10-CM

## 2017-01-08 DIAGNOSIS — R0789 Other chest pain: Secondary | ICD-10-CM | POA: Insufficient documentation

## 2017-01-08 HISTORY — PX: LEFT HEART CATH AND CORONARY ANGIOGRAPHY: CATH118249

## 2017-01-08 SURGERY — LEFT HEART CATH AND CORONARY ANGIOGRAPHY
Anesthesia: Moderate Sedation

## 2017-01-08 MED ORDER — VERAPAMIL HCL 2.5 MG/ML IV SOLN
INTRAVENOUS | Status: AC
Start: 2017-01-08 — End: 2017-01-08
  Filled 2017-01-08: qty 2

## 2017-01-08 MED ORDER — FENTANYL CITRATE (PF) 100 MCG/2ML IJ SOLN
INTRAMUSCULAR | Status: AC
  Filled 2017-01-08: qty 2

## 2017-01-08 MED ORDER — ASPIRIN 81 MG PO CHEW
CHEWABLE_TABLET | ORAL | Status: AC
  Filled 2017-01-08: qty 1

## 2017-01-08 MED ORDER — HEPARIN SODIUM (PORCINE) 1000 UNIT/ML IJ SOLN
INTRAMUSCULAR | Status: AC
  Filled 2017-01-08: qty 1

## 2017-01-08 MED ORDER — SODIUM CHLORIDE 0.9 % IV SOLN: INTRAVENOUS | Status: DC

## 2017-01-08 MED ORDER — SODIUM CHLORIDE 0.9 % IV SOLN: 250.0000 mL | INTRAVENOUS | Status: DC | PRN

## 2017-01-08 MED ORDER — IOPAMIDOL (ISOVUE-300) INJECTION 61%
INTRAVENOUS | Status: DC | PRN
  Administered 2017-01-08: 30 mL via INTRAVENOUS

## 2017-01-08 MED ORDER — MIDAZOLAM HCL 2 MG/2ML IJ SOLN
INTRAMUSCULAR | Status: DC | PRN
  Administered 2017-01-08: 1 mg via INTRAVENOUS

## 2017-01-08 MED ORDER — SODIUM CHLORIDE 0.9% FLUSH: 3.0000 mL | Freq: Two times a day (BID) | INTRAVENOUS | Status: DC

## 2017-01-08 MED ORDER — SODIUM CHLORIDE 0.9% FLUSH: 3.0000 mL | INTRAVENOUS | Status: DC | PRN

## 2017-01-08 MED ORDER — VERAPAMIL HCL 2.5 MG/ML IV SOLN
INTRAVENOUS | Status: DC | PRN
  Administered 2017-01-08: 2.5 mg via INTRA_ARTERIAL

## 2017-01-08 MED ORDER — HEPARIN SODIUM (PORCINE) 1000 UNIT/ML IJ SOLN
INTRAMUSCULAR | Status: DC | PRN
  Administered 2017-01-08: 5000 [IU] via INTRAVENOUS

## 2017-01-08 MED ORDER — SODIUM CHLORIDE 0.9 % WEIGHT BASED INFUSION
3.0000 mL/kg/h | INTRAVENOUS | Status: DC
  Administered 2017-01-08: 3 mL/kg/h via INTRAVENOUS

## 2017-01-08 MED ORDER — FENTANYL CITRATE (PF) 100 MCG/2ML IJ SOLN
INTRAMUSCULAR | Status: DC | PRN
  Administered 2017-01-08: 25 ug via INTRAVENOUS

## 2017-01-08 MED ORDER — ASPIRIN 81 MG PO CHEW: 81.0000 mg | CHEWABLE_TABLET | ORAL | Status: DC

## 2017-01-08 MED ORDER — SODIUM CHLORIDE 0.9 % WEIGHT BASED INFUSION: 1.0000 mL/kg/h | INTRAVENOUS | Status: DC

## 2017-01-08 MED ORDER — MIDAZOLAM HCL 2 MG/2ML IJ SOLN
INTRAMUSCULAR | Status: AC
  Filled 2017-01-08: qty 2

## 2017-01-08 SURGICAL SUPPLY — 7 items
CATH 5F 110X4 TIG (CATHETERS) ×3 IMPLANT
CATH INFINITI 5 FR JL3.5 (CATHETERS) ×3 IMPLANT
DEVICE RAD TR BAND REGULAR (VASCULAR PRODUCTS) ×3 IMPLANT
GLIDESHEATH SLEND SS 6F .021 (SHEATH) ×3 IMPLANT
KIT MANI 3VAL PERCEP (MISCELLANEOUS) ×3 IMPLANT
PACK CARDIAC CATH (CUSTOM PROCEDURE TRAY) ×3 IMPLANT
WIRE ROSEN-J .035X260CM (WIRE) ×3 IMPLANT

## 2017-01-08 NOTE — Interval H&P Note (Signed)
History and Physical Interval Note:  01/08/2017 7:00 AM  Wesley Bridges  has presented today for cardiac catheterization, with the diagnosis of chest pain. The various methods of treatment have been discussed with the patient and family. After consideration of risks, benefits and other options for treatment, the patient has consented to  Procedure(s): Left Heart Cath (Left) as a surgical intervention .  The patient's history has been reviewed, patient examined, no change in status, stable for surgery.  I have reviewed the patient's chart and labs.  Questions were answered to the patient's satisfaction.    Cath Lab Visit (complete for each Cath Lab visit)  Clinical Evaluation Leading to the Procedure:   ACS: No.  Non-ACS:    Anginal Classification: CCS III  Anti-ischemic medical therapy: Minimal Therapy (1 class of medications)  Non-Invasive Test Results: Low-risk stress test findings: cardiac mortality <1%/year  Prior CABG: No previous CABG  Wesley Bridges
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
## 2017-01-11 ENCOUNTER — Other Ambulatory Visit: Payer: Self-pay | Admitting: Family Medicine

## 2017-01-16 ENCOUNTER — Other Ambulatory Visit: Payer: Self-pay

## 2017-01-23 ENCOUNTER — Other Ambulatory Visit (INDEPENDENT_AMBULATORY_CARE_PROVIDER_SITE_OTHER): Payer: BLUE CROSS/BLUE SHIELD

## 2017-01-23 ENCOUNTER — Encounter: Payer: Self-pay | Admitting: Family Medicine

## 2017-01-23 ENCOUNTER — Ambulatory Visit (INDEPENDENT_AMBULATORY_CARE_PROVIDER_SITE_OTHER): Payer: BLUE CROSS/BLUE SHIELD

## 2017-01-23 ENCOUNTER — Telehealth: Payer: Self-pay

## 2017-01-23 VITALS — BP 157/102 | HR 66

## 2017-01-23 DIAGNOSIS — I10 Essential (primary) hypertension: Secondary | ICD-10-CM

## 2017-01-23 DIAGNOSIS — R7309 Other abnormal glucose: Secondary | ICD-10-CM

## 2017-01-23 LAB — BASIC METABOLIC PANEL
BUN: 15 mg/dL (ref 6–23)
CO2: 27 meq/L (ref 19–32)
CREATININE: 0.91 mg/dL (ref 0.40–1.50)
Calcium: 8.9 mg/dL (ref 8.4–10.5)
Chloride: 107 mEq/L (ref 96–112)
GFR: 94.73 mL/min (ref >60.00)
GLUCOSE: 110 mg/dL — AB (ref 70–99)
POTASSIUM: 4.6 meq/L (ref 3.5–5.1)
Sodium: 140 mEq/L (ref 135–145)

## 2017-01-23 LAB — HEMOGLOBIN A1C: Hgb A1c MFr Bld: 6 % (ref 4.6–6.5)

## 2017-01-23 NOTE — Telephone Encounter (Signed)
Patient was seen today for nurse visit and had a question concerning his conditions.  Pt had a question about RSDS?  States all symptoms point to this, after ankle injury everything got worse such as palpitations, blood pressure, etc.. Wants to know what do you think if this may be his condition?

## 2017-01-23 NOTE — Progress Notes (Signed)
BP above goal. I would recommend adding an additional medication to his regimen. Please see if there is a reason that his hydrochlorothiazide was discontinued previously. It appears it might of been related to him being dehydrated and it was stopped at that time. We can consider hydrochlorothiazide or increasing the dose of his lisinopril.

## 2017-01-23 NOTE — Telephone Encounter (Signed)
This potentially could be contributing though I believe he has been dealing with blood pressure issues for some time now. He could ask the orthopedic surgeon if they think that could be contributing. We could refer to sports medicine or a pain physician to see what their thoughts were as well. Thanks.

## 2017-01-23 NOTE — Progress Notes (Signed)
Patient comes in today for BP check. Bp was taken in right arm 146/96 and left arm 156/102. Patient was seen on 12/28/2016 and BP was 130/100. Patient is taking Lisinopril 20 mg. Labs done today and will have patient follow up depending on labs.

## 2017-01-24 NOTE — Telephone Encounter (Signed)
LMOM for patient to call back.

## 2017-01-25 NOTE — Telephone Encounter (Signed)
Patient notified and would like referral to pain management

## 2017-01-31 ENCOUNTER — Encounter: Payer: Self-pay | Admitting: Family Medicine

## 2017-01-31 ENCOUNTER — Other Ambulatory Visit: Payer: Self-pay | Admitting: Family Medicine

## 2017-01-31 DIAGNOSIS — G44229 Chronic tension-type headache, not intractable: Secondary | ICD-10-CM

## 2017-01-31 MED ORDER — HYDROCHLOROTHIAZIDE 12.5 MG PO TABS: 12.5000 mg | ORAL_TABLET | Freq: Every day | ORAL | 1 refills | Status: DC

## 2017-01-31 MED ORDER — LISINOPRIL 20 MG PO TABS: 20.0000 mg | ORAL_TABLET | Freq: Every day | ORAL | 3 refills | Status: DC

## 2017-01-31 NOTE — Telephone Encounter (Signed)
Patient states he would like the additional bp med to be sent to his pharmacy. Patient is not having thoughts of harming himself or anyone else, he is scheduled for an office visit tomorrow.

## 2017-02-01 ENCOUNTER — Encounter: Payer: Self-pay | Admitting: Family Medicine

## 2017-02-01 ENCOUNTER — Telehealth: Payer: Self-pay | Admitting: *Deleted

## 2017-02-01 ENCOUNTER — Other Ambulatory Visit (INDEPENDENT_AMBULATORY_CARE_PROVIDER_SITE_OTHER): Payer: Self-pay | Admitting: Orthopedic Surgery

## 2017-02-01 ENCOUNTER — Encounter (INDEPENDENT_AMBULATORY_CARE_PROVIDER_SITE_OTHER): Payer: Self-pay | Admitting: Orthopedic Surgery

## 2017-02-01 ENCOUNTER — Ambulatory Visit (INDEPENDENT_AMBULATORY_CARE_PROVIDER_SITE_OTHER): Payer: BLUE CROSS/BLUE SHIELD | Admitting: Family Medicine

## 2017-02-01 VITALS — BP 150/110 | HR 84 | Temp 98.5°F

## 2017-02-01 DIAGNOSIS — R519 Headache, unspecified: Secondary | ICD-10-CM

## 2017-02-01 DIAGNOSIS — I10 Essential (primary) hypertension: Secondary | ICD-10-CM | POA: Diagnosis not present

## 2017-02-01 DIAGNOSIS — F322 Major depressive disorder, single episode, severe without psychotic features: Secondary | ICD-10-CM | POA: Diagnosis not present

## 2017-02-01 DIAGNOSIS — R51 Headache: Secondary | ICD-10-CM

## 2017-02-01 LAB — COMPREHENSIVE METABOLIC PANEL
ALBUMIN: 4.2 g/dL (ref 3.5–5.2)
ALK PHOS: 82 U/L (ref 39–117)
ALT: 28 U/L (ref 0–53)
AST: 19 U/L (ref 0–37)
BUN: 18 mg/dL (ref 6–23)
CHLORIDE: 107 meq/L (ref 96–112)
CO2: 28 meq/L (ref 19–32)
Calcium: 9.7 mg/dL (ref 8.4–10.5)
Creatinine, Ser: 0.92 mg/dL (ref 0.40–1.50)
GFR: 93.54 mL/min (ref >60.00)
Glucose, Bld: 111 mg/dL — ABNORMAL HIGH (ref 70–99)
POTASSIUM: 4.3 meq/L (ref 3.5–5.1)
Sodium: 140 mEq/L (ref 135–145)
TOTAL PROTEIN: 7.7 g/dL (ref 6.0–8.3)
Total Bilirubin: 0.3 mg/dL (ref 0.2–1.2)

## 2017-02-01 LAB — SEDIMENTATION RATE: Sed Rate: 33 mm/hr — ABNORMAL HIGH (ref 0–15)

## 2017-02-01 MED ORDER — BUPROPION HCL ER (SR) 150 MG PO TB12: ORAL_TABLET | ORAL | 3 refills | Status: DC

## 2017-02-01 MED ORDER — HYDROCHLOROTHIAZIDE 12.5 MG PO TABS: 12.5000 mg | ORAL_TABLET | Freq: Every day | ORAL | 1 refills | Status: DC

## 2017-02-01 MED ORDER — LISINOPRIL 20 MG PO TABS: 20.0000 mg | ORAL_TABLET | Freq: Every day | ORAL | 3 refills | Status: DC

## 2017-02-01 NOTE — Telephone Encounter (Signed)
Wesley Bridges (Nurse Case Manager) with [facility] called asked if the patient can get something for pain. The number to contact Guerry Minors is [phone]

## 2017-02-01 NOTE — Assessment & Plan Note (Signed)
Patient's depression seems to have worsened. He notes not a whole lot of difference on 100 mg of Zoloft. Please see his PHQ 9 from today. He has thoughts of being better off dead nearly every day though he has no thoughts of harming himself. He has no thoughts of killing himself or suicide. He has no intent or plan to kill himself or harm himself. We'll start him on Wellbutrin. We will continue his Zoloft. He will see a Social worker. I did offer a psychiatry referral although they deferred at this time. We contracted for safety and the patient will seek medical attention in the emergency room if he develops thoughts of harming himself or others.

## 2017-02-01 NOTE — Telephone Encounter (Signed)
Patient has requested a call in reference to his blood pressure  Pt contact [phone]

## 2017-02-01 NOTE — Telephone Encounter (Signed)
Please change signature on letter

## 2017-02-01 NOTE — Assessment & Plan Note (Addendum)
Patient with daily persistent headaches. Intermittently he has headaches that are slightly different than his daily headaches with temporal sharp discomfort and a prominent vessel on the left side. He's had 4 of those episodes over the last year though none in the past several weeks and has had no vision issues with them. He had a prior ESR that was not significantly elevated. We'll recheck a sedimentation rate today. He is asymptomatic today. We placed a referral to neurology yesterday. He'll keep that appointment. If his sedimentation rate is up significantly we'll need to consider prednisone and referral for biopsy. He was given return precautions.

## 2017-02-01 NOTE — Telephone Encounter (Signed)
Letter created

## 2017-02-01 NOTE — Assessment & Plan Note (Signed)
Remains uncontrolled. Patient asymptomatic. It improved somewhat on recheck. He'll start HCTZ. We will check lab work today. He'll follow-up in 2 weeks for recheck. Given return precautions.

## 2017-02-01 NOTE — Telephone Encounter (Signed)
Patient would like to know if you could write a letter stating that you do not recommend surgery at this time. Patient states it is a workers comp case and they question him continuously about having the surgery, he would like to have proof that he is unable to have the surgery due to blood pressure.   Patients lisinopril was sent to wrong pharmacy, I have resent it to [facility].

## 2017-02-01 NOTE — Progress Notes (Addendum)
[name], MD Phone: [phone]  Wesley Bridges is a 46 y.o. male who presents today for follow-up.  Depression: Currently taking Zoloft. He notes this has not helped significantly. He does note thoughts of being better off dead though no thoughts or intent or plan to kill himself or harm himself or [place]. He has not seen a Social worker.  Depression screen [facility] 2/9 02/01/2017 11/30/2016  Decreased Interest 3 0  Down, Depressed, Hopeless 3 0  PHQ - 2 Score 6 0  Altered sleeping 1 -  Tired, decreased energy 3 -  Change in appetite 2 -  Feeling bad or failure about yourself  3 -  Trouble concentrating 2 -  Moving slowly or fidgety/restless 1 -  Suicidal thoughts 3 -  PHQ-9 Score 21 -  Difficult doing work/chores Extremely dIfficult -    Hypertension: Numbers are similar at home. He is currently on lisinopril and metoprolol. No chest pain in the last 2 weeks. 2 weeks ago he had a stabbing split-second chest pain that was focal in the left side of his chest. Does note some tenderness at times there. He has had intermittent chest pain like this for some time now. He saw cardiology for this. He had a catheterization with no significant CAD. No shortness of breath or edema.  Headaches: Notes occasionally he'll get a stabbing pain in his right temple that will go through to his left temple and a vessel will pop out. There is some tenderness in that area at that time. This has occurred on 4 occasions over the last year.This has not occurred in the last several weeks. No vision changes with this. Notes chronic intermittent left arm numbness that goes away quickly. No numbness or weakness elsewhere. He is asymptomatic at this time.  PMH: nonsmoker.   ROS see history of present illness  Objective  Physical Exam Vitals:   02/01/17 0848 02/01/17 0929  BP: (!) 160/120 (!) 150/110  Pulse: 84   Temp: 98.5 F (36.9 C)   SpO2: 96%     BP Readings from Last 3 Encounters:  02/01/17 (!)  150/110  01/23/17 (!) 157/102  01/08/17 116/84   Wt Readings from Last 3 Encounters:  01/08/17 220 lb (99.8 kg)  12/28/16 219 lb (99.3 kg)  12/24/16 221 lb (100.2 kg)    Physical Exam  Constitutional: No distress.  HENT:  Head: Normocephalic and atraumatic.  Mouth/Throat: Oropharynx is clear and moist. No oropharyngeal exudate.  No tenderness of bilateral temples, no prominent vasculature in bilateral temples  Eyes: Pupils are equal, round, and reactive to light. Conjunctivae are normal.  Cardiovascular: Normal rate, regular rhythm and normal heart sounds.   Pulmonary/Chest: Effort normal and breath sounds normal. He exhibits tenderness (Slight tenderness left lateral chest wall at edge of left pectoral muscle).  Musculoskeletal: He exhibits no edema.  Neurological: He is alert.  CN 2-12 intact, 5/5 strength in bilateral biceps, triceps, grip, quads, hamstrings, plantar and dorsiflexion, sensation to light touch intact in bilateral UE and LE  Skin: He is not diaphoretic.     Assessment/Plan: Please see individual problem list.  Essential hypertension Remains uncontrolled. Patient asymptomatic. It improved somewhat on recheck. He'll start HCTZ. We will check lab work today. He'll follow-up in 2 weeks for recheck. Given return precautions.  Headache Patient with daily persistent headaches. Intermittently he has headaches that are slightly different than his daily headaches with temporal sharp discomfort and a prominent vessel on the left side. He's had 4 of  those episodes over the last year though none in the past several weeks and has had no vision issues with them. He had a prior ESR that was not significantly elevated. We'll recheck a sedimentation rate today. He is asymptomatic today. We placed a referral to neurology yesterday. He'll keep that appointment. If his sedimentation rate is up significantly we'll need to consider prednisone and referral for biopsy. He was given return  precautions.   Major depression, single episode Patient's depression seems to have worsened. He notes not a whole lot of difference on 100 mg of Zoloft. Please see his PHQ 9 from today. He has thoughts of being better off dead nearly every day though he has no thoughts of harming himself. He has no thoughts of killing himself or suicide. He has no intent or plan to kill himself or harm himself. We'll start him on Wellbutrin. We will continue his Zoloft. He will see a Social worker. I did offer a psychiatry referral although they deferred at this time. We contracted for safety and the patient will seek medical attention in the emergency room if he develops thoughts of harming himself or others.   Orders Placed This Encounter  Procedures  . Comp Met (CMET)  . Sed Rate (ESR)    Meds ordered this encounter  Medications  . hydrochlorothiazide (HYDRODIURIL) 12.5 MG tablet    Sig: Take 1 tablet (12.5 mg total) by mouth daily.    Dispense:  90 tablet    Refill:  1  . buPROPion (WELLBUTRIN SR) 150 MG 12 hr tablet    Sig: Take 150 mg (one tablet) by mouth daily for 3 days, then increase to 150 mg (one tablet) sees by mouth twice daily    Dispense:  60 tablet    Refill:  [place], MD [place]

## 2017-02-01 NOTE — Patient Instructions (Signed)
Nice to see you. We will check lab work today and contact you with the results. Please start on hydrochlorothiazide today. Please continue to monitor your blood pressure. If it does not start to come down over the next 5-7 days please let us know. We will add Wellbutrin to your Zoloft. Please contact the counselor through your wife's work. If you develop persistent headaches or any vision changes or numbness or weakness please seek medical attention immediately. If you develop thoughts of harming your self or others please go to the emergency room immediately.

## 2017-02-02 ENCOUNTER — Emergency Department
Admission: EM | Admit: 2017-02-02 | Discharge: 2017-02-03 | Disposition: A | Discharge status: Home / Self Care | Payer: BLUE CROSS/BLUE SHIELD | Attending: Emergency Medicine | Admitting: Emergency Medicine

## 2017-02-02 DIAGNOSIS — R55 Syncope and collapse: Secondary | ICD-10-CM

## 2017-02-02 DIAGNOSIS — I10 Essential (primary) hypertension: Secondary | ICD-10-CM | POA: Diagnosis not present

## 2017-02-02 DIAGNOSIS — Z7982 Long term (current) use of aspirin: Secondary | ICD-10-CM | POA: Insufficient documentation

## 2017-02-02 DIAGNOSIS — J45909 Unspecified asthma, uncomplicated: Secondary | ICD-10-CM | POA: Insufficient documentation

## 2017-02-02 DIAGNOSIS — Z79899 Other long term (current) drug therapy: Secondary | ICD-10-CM | POA: Diagnosis not present

## 2017-02-02 DIAGNOSIS — E860 Dehydration: Secondary | ICD-10-CM | POA: Insufficient documentation

## 2017-02-02 LAB — CBC
HCT: 45.7 % (ref 40.0–52.0)
Hemoglobin: 15.6 g/dL (ref 13.0–18.0)
MCH: 30.7 pg (ref 26.0–34.0)
MCHC: 34.2 g/dL (ref 32.0–36.0)
MCV: 89.8 fL (ref 80.0–100.0)
PLATELETS: 288 10*3/uL (ref 150–440)
RBC: 5.09 MIL/uL (ref 4.40–5.90)
RDW: 14.5 % (ref 11.5–14.5)
WBC: 9.8 10*3/uL (ref 3.8–10.6)

## 2017-02-02 LAB — BASIC METABOLIC PANEL WITH GFR
Anion gap: 15 (ref 5–15)
BUN: 18 mg/dL (ref 6–20)
CO2: 20 mmol/L — ABNORMAL LOW (ref 22–32)
Calcium: 9.6 mg/dL (ref 8.9–10.3)
Chloride: 104 mmol/L (ref 101–111)
Creatinine, Ser: 1.38 mg/dL — ABNORMAL HIGH (ref 0.61–1.24)
GFR calc Af Amer: >60 mL/min
GFR calc non Af Amer: 60 mL/min — ABNORMAL LOW
Glucose, Bld: 107 mg/dL — ABNORMAL HIGH (ref 65–99)
Potassium: 3.2 mmol/L — ABNORMAL LOW (ref 3.5–5.1)
Sodium: 139 mmol/L (ref 135–145)

## 2017-02-02 LAB — TROPONIN I: Troponin I: <0.03 ng/mL (ref <0.03)

## 2017-02-02 MED ORDER — POTASSIUM CHLORIDE CRYS ER 20 MEQ PO TBCR
40.0000 meq | EXTENDED_RELEASE_TABLET | Freq: Once | ORAL | Status: AC
  Administered 2017-02-02: 40 meq via ORAL
  Filled 2017-02-02: qty 2

## 2017-02-02 MED ORDER — SODIUM CHLORIDE 0.9 % IV BOLUS (SEPSIS)
500.0000 mL | Freq: Once | INTRAVENOUS | Status: AC
  Administered 2017-02-02: 500 mL via INTRAVENOUS

## 2017-02-02 NOTE — ED Provider Notes (Signed)
[facility] Emergency Department Provider Note   ____________________________________________   First MD Initiated Contact with Patient 02/02/17 2019     (approximate)  I have reviewed the triage vital signs and the nursing notes.   HISTORY  Chief Complaint Loss of Consciousness   HPI Wesley Bridges is a 46 y.o. male for evaluation after he passed out  Patient was in the yard, pulling weeds when he became lightheaded. He got upset in a chair, and then was witnessed to pass out. Family or at his side and held him up so he did not fall.  He awoke on the ground. Reports he had had some alcohol, not been staying hydrated, and outside in the for a while. He also reports been having elevated blood pressures and started hydrochlorothiazide for the first time today  Denies any chest pain or trouble breathing. Reports a mild headache, but did not fall or strike his head. No numbness or tingling. No weakness. There is some slight funny sensation in his lips and hands that has now resolved.  Patient does report as headache is not unusual, he gets headaches frequently, no different than today. Described as mild aching sensation. No neck pain.    Past Medical History:  Diagnosis Date  . Asthma   . Chronic headaches   . GERD (gastroesophageal reflux disease)   . Hyperlipidemia   . Hypertension   . Subclinical hyperthyroidism     Patient Active Problem List   Diagnosis Date Noted  . Major depression, single episode 09/16/2016  . Palpitations 09/16/2016  . Inguinal hernia 09/16/2016  . Fibrosis of subtalar joint, right 07/16/2016  . Acute upper respiratory infection 04/18/2016  . Joint pain 04/18/2016  . Enlarged prostate 03/22/2016  . Lightheadedness 12/15/2015  . Syncope 12/06/2015  . Injury of toe on left foot 10/31/2015  . Right ankle swelling 10/31/2015  . Esophageal reflux 10/31/2015  . Numbness 10/31/2015  . Hyperlipidemia 04/13/2015  .  Essential hypertension 03/18/2015  . Headache 03/18/2015  . Chest pain 03/18/2015    Past Surgical History:  Procedure Laterality Date  . ANKLE FRACTURE SURGERY    . ANKLE FUSION    . FEMUR FRACTURE SURGERY    . LEFT HEART CATH AND CORONARY ANGIOGRAPHY N/A 01/08/2017   Procedure: Left Heart Cath and Coronary Angiography;  Surgeon: Nelva Bush, MD;  Location: [place] [facility];  Service: Cardiovascular;  Laterality: N/A;  . MANDIBLE FRACTURE SURGERY      Prior to Admission medications   Medication Sig Start Date End Date Taking? Authorizing Provider  acetaminophen (TYLENOL) 500 MG tablet Take 1,000 mg by mouth every 6 (six) hours as needed for headache.    Yes [provider]  aspirin EC 81 MG tablet Take 1 tablet (81 mg total) by mouth daily. 03/23/15  Yes Leone Haven, MD  atorvastatin (LIPITOR) 40 MG tablet Take 1 tablet (40 mg total) by mouth daily. 12/19/16  Yes Leone Haven, MD  buPROPion [facility] SR) 150 MG 12 hr tablet Take 150 mg (one tablet) by mouth daily for 3 days, then increase to 150 mg (one tablet) sees by mouth twice daily 02/01/17  Yes Leone Haven, MD  hydrochlorothiazide (HYDRODIURIL) 12.5 MG tablet Take 1 tablet (12.5 mg total) by mouth daily. 02/01/17  Yes Leone Haven, MD  ibuprofen (ADVIL,MOTRIN) 800 MG tablet TAKE 1 TABLET BY MOUTH 3 TIMES DAY AS NEEDED FOR PAIN 06/28/16  Yes [provider]  lisinopril (PRINIVIL,ZESTRIL)  20 MG tablet Take 1 tablet (20 mg total) by mouth daily. 02/01/17 01/27/18 Yes Leone Haven, MD  loratadine (CLARITIN) 10 MG tablet Take 1 tablet (10 mg total) by mouth daily. Patient taking differently: Take 20 mg by mouth daily.  04/18/16  Yes Leone Haven, MD  metoprolol tartrate (LOPRESSOR) 25 MG tablet Take 1 tablet (25 mg total) by mouth 2 (two) times daily. 12/18/16 03/18/17 Yes End, Harrell Gave, MD  Multiple Vitamin (MULTIVITAMIN WITH MINERALS) TABS tablet Take 1 tablet by mouth at  bedtime.   Yes [provider]  nitroGLYCERIN (NITROSTAT) 0.4 MG SL tablet Place 1 tablet (0.4 mg total) under the tongue every 5 (five) minutes as needed for chest pain. Maximum of 3 doses. 12/18/16 03/18/17 Yes End, Harrell Gave, MD  pantoprazole (PROTONIX) 40 MG tablet Take 1 tablet (40 mg total) by mouth 2 (two) times daily before a meal. 09/14/16  Yes Leone Haven, MD  sertraline (ZOLOFT) 100 MG tablet Take 1 tablet (100 mg total) by mouth daily. 12/17/16  Yes Leone Haven, MD  potassium chloride 20 MEQ TBCR Take 20 mEq by mouth daily. Patient not taking: Reported on 02/01/2017 12/07/15   Hillary Bow, MD    Allergies Pollen extract  Family History  Problem Relation Age of Onset  . Arthritis Unknown        parents  . Hypertension Mother   . Thyroid disease Mother   . Osteoporosis Mother   . Syncope episode Mother   . Hypertension Brother     Social History Social History  Substance Use Topics  . Smoking status: Never Smoker  . Smokeless tobacco: Never Used  . Alcohol use 8.4 oz/week    14 Cans of beer per week    Review of Systems Constitutional: No fever/chills Eyes: No visual changes. ENT: No sore throat. Cardiovascular: Denies chest pain. Respiratory: Denies shortness of breath. Gastrointestinal: No abdominal pain.  No nausea, no vomiting.  No diarrhea.  No constipation. Genitourinary: Negative for dysuria. Musculoskeletal: Negative for back pain. Skin: Negative for rash. Neurological: Negative for Trouble speaking, weakness in arm or leg, or facial droop.    ____________________________________________   PHYSICAL EXAM:  VITAL SIGNS: ED Triage Vitals  Enc Vitals Group     BP 02/02/17 2010 (!) 129/91     Pulse Rate 02/02/17 2010 80     Resp 02/02/17 2010 17     Temp 02/02/17 2010 97.6 F (36.4 C)     Temp Source 02/02/17 2010 Oral     SpO2 02/02/17 2010 99 %     Weight 02/02/17 2010 215 lb (97.5 kg)     Height 02/02/17 2010 5\' 6"   (1.676 m)     Head Circumference --      Peak Flow --      Pain Score 02/02/17 2009 7     Pain Loc --      Pain Edu? --      Excl. in [place]? --     Constitutional: Alert and oriented. Well appearing and in no acute distress. Eyes: Conjunctivae are normal. Head: Atraumatic. Nose: No congestion/rhinnorhea. Mouth/Throat: Mucous membranes are moist. Neck: No stridor.  No cervical tenderness Cardiovascular: Normal rate, regular rhythm. Grossly normal heart sounds.  Good peripheral circulation. Respiratory: Normal respiratory effort.  No retractions. Lungs CTAB. Gastrointestinal: Soft and nontender. No distention. Musculoskeletal: No lower extremity tenderness nor edema. Right lower extremity with evidence of old surgery about the ankle. Neurologic:  Normal speech and language. No  gross focal neurologic deficits are appreciated.  Skin:  Skin is warm, dry and intact. No rash noted. Psychiatric: Mood and affect are normal. Speech and behavior are normal.  ____________________________________________   LABS (all labs ordered are listed, but only abnormal results are displayed)  Labs Reviewed  BASIC METABOLIC PANEL - Abnormal; Notable for the following:       Result Value   Potassium 3.2 (*)    CO2 20 (*)    Glucose, Bld 107 (*)    Creatinine, Ser 1.38 (*)    GFR calc non Af Amer 60 (*)    All other components within normal limits  CBC  TROPONIN I  TROPONIN I   ____________________________________________  EKG  Reviewed and interpreted by me at 2010 Heart rate 80 Care is 100 QTc 4:30 Normal sinus rhythm, no evidence of ischemia or ectopy ____________________________________________  RADIOLOGY  No results found.  No indication for imaging noted at this time. Mild headache which he reports is chronic and intermittent. Denies head trauma. No associated neurologic symptoms. No chest pain or trouble breathing. Reassuring vital  signs ____________________________________________   PROCEDURES  Procedure(s) performed: None  Procedures  Critical Care performed: No  ____________________________________________   INITIAL IMPRESSION / ASSESSMENT AND PLAN / ED COURSE  Pertinent labs & imaging results that were available during my care of the patient were reviewed by me and considered in my medical decision making (see chart for details).  Syncope. Possibly due to hypotension, possibly due to dehydration. Patient reports being outside, had had a drink was then working became lightheaded, passed out while sitting in a chair. No associated cardiac symptoms. SF syncope rule negative.  ----------------------------------------- 11:47 PM on 02/02/2017 -----------------------------------------  Suspect likely mild dehydration, minimally elevated creatinine. Reports not staying hydrated today and using some alcohol prior to incident along with starting Hydrochlorothiazide. He is awake alert and oriented reports that he feels well now. He's been able to sit up and not felt weak.  Plan to hydrate, after hydration and repeat troponin patient continues to feel well plan to discharge home in the care of his family including his wife who is at the bedside. Advised to discontinue hydrochlorothiazide and call Dr. Caryl Bis Monday for close follow-up.  Return precautions and treatment recommendations and follow-up discussed with the patient who is agreeable with the plan.  Ongoing care and disposition assigned to Dr. Karma Greaser, with plan to discharge if second troponin is normal and patient remains well.      ____________________________________________   FINAL CLINICAL IMPRESSION(S) / ED DIAGNOSES  Final diagnoses:  Syncope and collapse  Dehydration      NEW MEDICATIONS STARTED DURING THIS VISIT:  New Prescriptions   No medications on file     Note:  This document was prepared using Dragon voice recognition  software and may include unintentional dictation errors.     Delman Kitten, MD 02/02/17 2348

## 2017-02-02 NOTE — Discharge Instructions (Signed)

## 2017-02-02 NOTE — ED Triage Notes (Signed)
Pt arrived via ems for c/o dizziness and syncopal episode - he was outside at a pool party drinking and weed eating when he became dizzy and passed out - he came to and at this time is a&o x3 and in no acute distress - respirations are even and unlabored

## 2017-02-03 LAB — TROPONIN I: Troponin I: <0.03 ng/mL (ref <0.03)

## 2017-02-04 ENCOUNTER — Other Ambulatory Visit (INDEPENDENT_AMBULATORY_CARE_PROVIDER_SITE_OTHER): Payer: Self-pay | Admitting: Family

## 2017-02-04 ENCOUNTER — Encounter: Payer: Self-pay | Admitting: Family Medicine

## 2017-02-04 ENCOUNTER — Telehealth (INDEPENDENT_AMBULATORY_CARE_PROVIDER_SITE_OTHER): Payer: Self-pay | Admitting: Orthopedic Surgery

## 2017-02-04 MED ORDER — HYDROCODONE-ACETAMINOPHEN 5-325 MG PO TABS: 1.0000 | ORAL_TABLET | Freq: Three times a day (TID) | ORAL | 0 refills | Status: DC | PRN

## 2017-02-04 NOTE — Telephone Encounter (Signed)
Left message to notify letter is at front desk 

## 2017-02-04 NOTE — Telephone Encounter (Signed)
I called and spoke with patient to advise rx for hydrocodone at front desk. He is wanting his son Janith Lima to pick this up for him advised him to bring photo ID. He expressed understanding. He is also wanting pain management referral, because he is unsure of when he will be able to obtain cardiac clearance for surgery.

## 2017-02-04 NOTE — Telephone Encounter (Signed)
Patient called asked if he can get a doctor's note to remain out of work for 3 months. Patient advised he can pick up note when he pick up his Rx. The number to contact patient is [phone]

## 2017-02-05 NOTE — Telephone Encounter (Signed)
I called and left vm for patients case manager, he needs to either get cardiac clearance or get FCE.  We cannot infinitively write him out. His cardiologist or medical doctor can address, from orthopedic standpoint this is fixable with surgery. Pain management referral denied, he can see his medical doctor for this too. Pain management referral are for non surgical candidates, he is a surgical candidate he needs to get clearance. I advised this to patient too, he expressed understanding.

## 2017-02-05 NOTE — Telephone Encounter (Signed)
Call patient we would have to obtain a functional capacity evaluation to take him out of work for 3 months.

## 2017-02-05 NOTE — Telephone Encounter (Signed)
Patient is requesting note to be out of work x 3 months. Pending cardiac clearance.

## 2017-02-07 ENCOUNTER — Ambulatory Visit
Admission: RE | Admit: 2017-02-07 | Discharge: 2017-02-07 | Disposition: A | Discharge status: Home / Self Care | Payer: BLUE CROSS/BLUE SHIELD | Source: Ambulatory Visit | Attending: Family Medicine | Admitting: Family Medicine

## 2017-02-07 ENCOUNTER — Ambulatory Visit (INDEPENDENT_AMBULATORY_CARE_PROVIDER_SITE_OTHER): Payer: BLUE CROSS/BLUE SHIELD | Admitting: Family Medicine

## 2017-02-07 ENCOUNTER — Encounter: Payer: Self-pay | Admitting: Family Medicine

## 2017-02-07 VITALS — BP 134/80 | HR 65 | Temp 98.2°F | Wt 222.0 lb

## 2017-02-07 DIAGNOSIS — R55 Syncope and collapse: Secondary | ICD-10-CM

## 2017-02-07 DIAGNOSIS — R519 Headache, unspecified: Secondary | ICD-10-CM

## 2017-02-07 DIAGNOSIS — R200 Anesthesia of skin: Secondary | ICD-10-CM

## 2017-02-07 DIAGNOSIS — J329 Chronic sinusitis, unspecified: Secondary | ICD-10-CM | POA: Insufficient documentation

## 2017-02-07 DIAGNOSIS — R51 Headache: Secondary | ICD-10-CM | POA: Diagnosis not present

## 2017-02-07 DIAGNOSIS — I10 Essential (primary) hypertension: Secondary | ICD-10-CM | POA: Diagnosis not present

## 2017-02-07 LAB — COMPREHENSIVE METABOLIC PANEL
ALT: 22 U/L (ref 0–53)
AST: 18 U/L (ref 0–37)
Albumin: 3.8 g/dL (ref 3.5–5.2)
Alkaline Phosphatase: 71 U/L (ref 39–117)
BILIRUBIN TOTAL: 0.4 mg/dL (ref 0.2–1.2)
BUN: 15 mg/dL (ref 6–23)
CHLORIDE: 105 meq/L (ref 96–112)
CO2: 30 meq/L (ref 19–32)
Calcium: 9.1 mg/dL (ref 8.4–10.5)
Creatinine, Ser: 0.86 mg/dL (ref 0.40–1.50)
GFR: 101.1 mL/min (ref >60.00)
GLUCOSE: 109 mg/dL — AB (ref 70–99)
Potassium: 4.1 mEq/L (ref 3.5–5.1)
Sodium: 140 mEq/L (ref 135–145)
Total Protein: 6.9 g/dL (ref 6.0–8.3)

## 2017-02-07 LAB — SEDIMENTATION RATE: Sed Rate: 22 mm/hr — ABNORMAL HIGH (ref 0–15)

## 2017-02-07 MED ORDER — PREDNISONE 50 MG PO TABS: 50.0000 mg | ORAL_TABLET | Freq: Every day | ORAL | 0 refills | Status: DC

## 2017-02-07 NOTE — Progress Notes (Signed)
[name], MD Phone: [phone]  Wesley Bridges is a 46 y.o. male who presents today for follow-up.  Since his last visit the patient was seen in the emergency room for syncopal episode. He was at home working outside in the afternoon and developed shortness of breath and lightheadedness and started to vomit and then passed out. His wife ended up checking his blood pressure and it was 110/48. They called EMS who came and evaluated him. He had difficulty tracking and difficulty seeing while he was coming to. They laid him back down and he was able to come back to. He notes since then the left tip of his tongue has been numb. His left fingers have been numb and tingly as well at times. He notes no recurrent vision changes. No recurrent syncopal episodes. Emergency room evaluation revealed elevated creatinine and low potassium. Negative cardiac evaluation. They discharged him with a diagnosis of dehydration. He's had no chest pain. He's had no recurrent shortness of breath. He continues to have headaches that are similar to his prior dull frequent headaches though yesterday did note he had a recurrent episode of the left temporal headache with tenderness. No vision changes at that time. No noted vascular prominence yesterday with this episode has had that previously. This is the fifth episode of this kind of headache he's had over the last year.  PMH: nonsmoker.   ROS see history of present illness  Objective  Physical Exam Vitals:   02/07/17 0832 02/07/17 0856  BP: (!) 140/100 134/80  Pulse: 65   Temp: 98.2 F (36.8 C)   SpO2: 98%     BP Readings from Last 3 Encounters:  02/07/17 134/80  02/03/17 (!) 135/94  02/01/17 (!) 150/110   Wt Readings from Last 3 Encounters:  02/07/17 222 lb (100.7 kg)  02/02/17 215 lb (97.5 kg)  01/08/17 220 lb (99.8 kg)    Physical Exam  Constitutional: No distress.  HENT:  Head: Normocephalic and atraumatic.  Mouth/Throat: Oropharynx  is clear and moist. No oropharyngeal exudate.  Bilateral temples with no tenderness or vascular prominence  Eyes: Pupils are equal, round, and reactive to light. Conjunctivae are normal.  Cardiovascular: Normal rate, regular rhythm and normal heart sounds.   Pulmonary/Chest: Effort normal and breath sounds normal.  Neurological: He is alert.  CN 2-12 intact, 5/5 strength in bilateral biceps, triceps, grip, quads, hamstrings, plantar and dorsiflexion, sensation to light touch intact in bilateral UE and LE  Skin: Skin is warm and dry. He is not diaphoretic.     Assessment/Plan: Please see individual problem list.  Syncope Patient with syncopal episode potentially related to orthostasis and dehydration though he does note some numbness and tingling in his left hand and left tongue that has been somewhat persistent. Given this we'll obtain an MRI brain to evaluate for any cause of this. Workup in the ED reviewed. BMP will be rechecked.  Headache Continues to have daily persistent headaches. These are unchanged from previously. He is neurologically intact. He had recurrence of the unilateral left-sided headache yesterday with tenderness of his left temple. No vascular prominence yesterday. Sedimentation rate has been mildly elevated though nothing significant in the past. He is asymptomatic today. We will recheck a sedimentation rate today. We'll place him on prednisone. We'll get him set up with ENT to consider biopsy.  Essential hypertension Improved on recheck. We will await his evaluation as listed below and then consider starting on alternative blood pressure medication.   Orders Placed  This Encounter  Procedures  . MR Brain Wo Contrast    Standing Status:   Future    Number of Occurrences:   1    Standing Expiration Date:   04/09/2018    Order Specific Question:   What is the patient's sedation requirement?    Answer:   No Sedation    Order Specific Question:   Does the patient have a  pacemaker or implanted devices?    Answer:   No    Order Specific Question:   Preferred imaging location?    Answer:   [facility] (table limit-300lbs)    Order Specific Question:   Call Results- Best Contact Number?    Answer:   [phone], hold pt    Order Specific Question:   Radiology Contrast Protocol - do NOT remove file path    Answer:   [email or web address]  . Comp Met (CMET)  . Sed Rate (ESR)  . Ambulatory referral to ENT    Referral Priority:   Routine    Referral Type:   Consultation    Referral Reason:   Specialty Services Required    Requested Specialty:   Otolaryngology    Number of Visits Requested:   1    Meds ordered this encounter  Medications  . predniSONE (DELTASONE) 50 MG tablet    Sig: Take 1 tablet (50 mg total) by mouth daily with breakfast.    Dispense:  7 tablet    Refill:  0    [name], MD [place]

## 2017-02-07 NOTE — Assessment & Plan Note (Addendum)
Patient with syncopal episode potentially related to orthostasis and dehydration though he does note some numbness and tingling in his left hand and left tongue that has been somewhat persistent. Given this we'll obtain an MRI brain to evaluate for any cause of this. Workup in the ED reviewed. BMP will be rechecked.

## 2017-02-07 NOTE — Assessment & Plan Note (Signed)
Improved on recheck. We will await his evaluation as listed below and then consider starting on alternative blood pressure medication.

## 2017-02-07 NOTE — Assessment & Plan Note (Addendum)
Continues to have daily persistent headaches. These are unchanged from previously. He is neurologically intact. He had recurrence of the unilateral left-sided headache yesterday with tenderness of his left temple. No vascular prominence yesterday. Sedimentation rate has been mildly elevated though nothing significant in the past. He is asymptomatic today. We will recheck a sedimentation rate today. We'll place him on prednisone. We'll get him set up with ENT to consider biopsy.

## 2017-02-07 NOTE — Patient Instructions (Signed)
Nice to see. We'll get an MRI of your head today. We will recheck lab work. If you have recurrent syncope or develop vision changes, numbness, new weakness, or any new or changing symptoms please seek medical attention.
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
## 2017-02-12 ENCOUNTER — Ambulatory Visit: Payer: BLUE CROSS/BLUE SHIELD | Admitting: Internal Medicine

## 2017-02-13 ENCOUNTER — Ambulatory Visit (INDEPENDENT_AMBULATORY_CARE_PROVIDER_SITE_OTHER): Payer: BLUE CROSS/BLUE SHIELD | Admitting: Internal Medicine

## 2017-02-13 ENCOUNTER — Encounter: Payer: Self-pay | Admitting: Internal Medicine

## 2017-02-13 VITALS — BP 161/112 | HR 80 | Ht 67.0 in | Wt 221.8 lb

## 2017-02-13 DIAGNOSIS — R0789 Other chest pain: Secondary | ICD-10-CM

## 2017-02-13 DIAGNOSIS — R55 Syncope and collapse: Secondary | ICD-10-CM

## 2017-02-13 DIAGNOSIS — I10 Essential (primary) hypertension: Secondary | ICD-10-CM

## 2017-02-13 MED ORDER — NEBIVOLOL HCL 10 MG PO TABS: 10.0000 mg | ORAL_TABLET | Freq: Every day | ORAL | 3 refills | Status: DC
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
## 2017-02-13 NOTE — Patient Instructions (Addendum)
Medication Instructions:  Your physician has recommended you make the following change in your medication:  1- STOP Metoprolol. 2- START Bystolic 10 mg (1 tablet) by mouth once a day.   Labwork: none  Testing/Procedures: none  Follow-Up: Your physician recommends that you schedule a follow-up appointment in: [place].   Dr End recommends that you drink plenty of fluids, mainly water, to stay well hydrated.   If you need a refill on your cardiac medications before your next appointment, please call your pharmacy.   Medication Samples have been provided to the patient.  Drug name: Bystolic       Strength: 10 mg        Qty: 4 boxes  LOT: [id number]  Exp.Date: 10/19

## 2017-02-18 ENCOUNTER — Encounter: Payer: Self-pay | Admitting: Family Medicine

## 2017-02-18 ENCOUNTER — Ambulatory Visit (INDEPENDENT_AMBULATORY_CARE_PROVIDER_SITE_OTHER): Payer: BLUE CROSS/BLUE SHIELD | Admitting: Family Medicine

## 2017-02-18 VITALS — BP 114/80 | HR 67 | Temp 98.5°F | Wt 221.2 lb

## 2017-02-18 DIAGNOSIS — R946 Abnormal results of thyroid function studies: Secondary | ICD-10-CM | POA: Diagnosis not present

## 2017-02-18 DIAGNOSIS — R7989 Other specified abnormal findings of blood chemistry: Secondary | ICD-10-CM | POA: Insufficient documentation

## 2017-02-18 DIAGNOSIS — I10 Essential (primary) hypertension: Secondary | ICD-10-CM

## 2017-02-18 DIAGNOSIS — G44209 Tension-type headache, unspecified, not intractable: Secondary | ICD-10-CM | POA: Diagnosis not present

## 2017-02-18 DIAGNOSIS — F322 Major depressive disorder, single episode, severe without psychotic features: Secondary | ICD-10-CM

## 2017-02-18 DIAGNOSIS — J329 Chronic sinusitis, unspecified: Secondary | ICD-10-CM | POA: Insufficient documentation

## 2017-02-18 DIAGNOSIS — J320 Chronic maxillary sinusitis: Secondary | ICD-10-CM

## 2017-02-18 LAB — TSH: TSH: 1.76 u[IU]/mL (ref 0.35–4.50)

## 2017-02-18 LAB — T4, FREE: Free T4: 0.77 ng/dL (ref 0.60–1.60)

## 2017-02-18 NOTE — Patient Instructions (Signed)
Nice to see you. We'll check her TSH. Please continue to monitor your blood pressure. If it starts to run lower than 90/60 please cut your lisinopril dose in half and let us know. Please follow up with ENT as planned.

## 2017-02-18 NOTE — Assessment & Plan Note (Addendum)
Significantly improved. Suspect related to chronic sinusitis and blood pressure. He will monitor for recurrence.

## 2017-02-18 NOTE — Assessment & Plan Note (Signed)
- 

## 2017-02-18 NOTE — Progress Notes (Signed)
  [name], MD Phone: [phone]  Wesley Bridges is a 46 y.o. male who presents today for follow-up.  Hypertension: Recently has been better controlled. He saw cardiology last week and they placed him on Bystolic. He is also on lisinopril. He notes no lightheadedness, chest pain, shortness breath, or edema. Most recent blood pressure at home was 96/64.  He saw ENT last week and was started on clindamycin for a 14 day course given his chronic sinusitis. He notes less pressure since starting on this though still some pressure. He notes his headaches have improved with this treatment as well as his lower blood pressure. The patient reports they did discuss his headaches and states the ENT physician did not seem worried regarding the temporal headaches.  Depression: Patient notes symptoms are similar. He is on Zoloft and Wellbutrin. He does have some thoughts of wondering why he is here though no active suicidal ideation or intent or plan to harm himself. No HI. A little anxiety.  PMH: nonsmoker.   ROS see history of present illness  Objective  Physical Exam Vitals:   02/18/17 0828  BP: 114/80  Pulse: 67  Temp: 98.5 F (36.9 C)  SpO2: 95%    BP Readings from Last 3 Encounters:  02/18/17 114/80  02/13/17 (!) 161/112  02/07/17 134/80   Wt Readings from Last 3 Encounters:  02/18/17 221 lb 3.2 oz (100.3 kg)  02/13/17 221 lb 12 oz (100.6 kg)  02/07/17 222 lb (100.7 kg)    Physical Exam  Constitutional: No distress.  Cardiovascular: Normal rate, regular rhythm and normal heart sounds.   Pulmonary/Chest: Effort normal and breath sounds normal.  Neurological: He is alert.  Skin: Skin is warm and dry. He is not diaphoretic.     Assessment/Plan: Please see individual problem list.  Essential hypertension Significantly improved. It was low normal at home on most recent check. Discussed continuing Bystolic and lisinopril. Advised if his blood pressure starts run  less than 90/60 they could cut the lisinopril dose in half and let us know.  Headache Significantly improved. Suspect related to chronic sinusitis and blood pressure. He will monitor for recurrence.  Major depression, single episode Continues to have issues with this. We'll continue Zoloft and Wellbutrin. If not improving he'll let us know.  Chronic sinusitis Continue to follow with ENT.  Low TSH level Recheck labs.   Orders Placed This Encounter  Procedures  . TSH  . T4, free   [name], MD [place]

## 2017-02-18 NOTE — Assessment & Plan Note (Signed)
Recheck labs 

## 2017-02-18 NOTE — Assessment & Plan Note (Signed)
Continues to have issues with this. We'll continue Zoloft and Wellbutrin. If not improving he'll let us know.

## 2017-02-18 NOTE — Assessment & Plan Note (Signed)
Significantly improved. It was low normal at home on most recent check. Discussed continuing Bystolic and lisinopril. Advised if his blood pressure starts run less than 90/60 they could cut the lisinopril dose in half and let us know.

## 2017-02-21 ENCOUNTER — Ambulatory Visit (INDEPENDENT_AMBULATORY_CARE_PROVIDER_SITE_OTHER): Payer: Worker's Compensation | Admitting: Orthopedic Surgery

## 2017-02-21 ENCOUNTER — Encounter (INDEPENDENT_AMBULATORY_CARE_PROVIDER_SITE_OTHER): Payer: Self-pay | Admitting: Orthopedic Surgery

## 2017-02-21 DIAGNOSIS — M24671 Ankylosis, right ankle: Secondary | ICD-10-CM | POA: Diagnosis not present

## 2017-02-21 NOTE — Progress Notes (Signed)
Office Visit Note   Patient: Wesley Bridges           Date of Birth: October 04, 1970           MRN: [id number] Visit Date: 02/21/2017              Requested by: Leone Haven, MD [address] [place], [place] [address] PCP: Leone Haven, MD  Chief Complaint  Patient presents with  . Right Ankle - Follow-up      HPI: Patient is a 46 year old gentleman who is status post ankle fusion approximately 20 years ago. Patient injured his subtalar joint at work on November 2017. Patient was last seen in the office in June and was going to obtain cardiac clearance for surgery patient states his blood pressure was initially in the 200s over 160s and he has undergone multiple blood pressure medication changes he states at one time he was hypotensive and had to go to the emergency room. Patient states he still has subtalar pain he states his cardiologist has recommended not proceeding with surgery until his blood pressure stabilized. Patient did have a cardiac cath in July and he states that this was okay.  Assessment & Plan: Visit Diagnoses:  1. Fibrosis of subtalar joint, right     Plan: We will go ahead and obtain a functional capacity evaluation he is given a note to be out of work until the functional capacity evaluation is completed. At that time patient will follow the office and we will provide a permanent partial impairment rating and permanent work restriction.  Follow-Up Instructions: Return if symptoms worsen or fail to improve.   Ortho Exam  Patient is alert, oriented, no adenopathy, well-dressed, normal affect, normal respiratory effort. Examination patient is ambulating with a kneeling scooter. He has pain to palpation over the sinus Tarsi. Inversion eversion reproduces subtalar pain. He has a good dorsalis pedis pulses foot is plantigrade. There is no redness no cellulitis there is minimal swelling.  Imaging: No results found. No images are attached to  the encounter.  Labs: Lab Results  Component Value Date   HGBA1C 6.0 01/23/2017   ESRSEDRATE 22 (H) 02/07/2017   ESRSEDRATE 33 (H) 02/01/2017   ESRSEDRATE 24 (H) 12/24/2016    Orders:  No orders of the defined types were placed in this encounter.  No orders of the defined types were placed in this encounter.    Procedures: No procedures performed  Clinical Data: No additional findings.  ROS:  All other systems negative, except as noted in the HPI. Review of Systems  Objective: Vital Signs: There were no vitals taken for this visit.  Specialty Comments:  No specialty comments available.  PMFS History: Patient Active Problem List   Diagnosis Date Noted  . Chronic sinusitis 02/18/2017  . Low TSH level 02/18/2017  . Major depression, single episode 09/16/2016  . Palpitations 09/16/2016  . Inguinal hernia 09/16/2016  . Fibrosis of subtalar joint, right 07/16/2016  . Acute upper respiratory infection 04/18/2016  . Joint pain 04/18/2016  . Enlarged prostate 03/22/2016  . Lightheadedness 12/15/2015  . Syncope 12/06/2015  . Injury of toe on left foot 10/31/2015  . Right ankle swelling 10/31/2015  . Esophageal reflux 10/31/2015  . Numbness 10/31/2015  . Hyperlipidemia 04/13/2015  . Essential hypertension 03/18/2015  . Headache 03/18/2015  . Atypical chest pain 03/18/2015   Past Medical History:  Diagnosis Date  . Asthma   . Chronic headaches   . GERD (gastroesophageal  reflux disease)   . Hyperlipidemia   . Hypertension   . Subclinical hyperthyroidism     Family History  Problem Relation Age of Onset  . Arthritis Unknown        parents  . Hypertension Mother   . Thyroid disease Mother   . Osteoporosis Mother   . Syncope episode Mother   . Hypertension Brother     Past Surgical History:  Procedure Laterality Date  . ANKLE FRACTURE SURGERY    . ANKLE FUSION    . FEMUR FRACTURE SURGERY    . LEFT HEART CATH AND CORONARY ANGIOGRAPHY N/A 01/08/2017    Procedure: Left Heart Cath and Coronary Angiography;  Surgeon: Nelva Bush, MD;  Location: [facility];  Service: Cardiovascular;  Laterality: N/A;  . MANDIBLE FRACTURE SURGERY     Social History   Occupational History  . Not on file.   Social History Main Topics  . Smoking status: Never Smoker  . Smokeless tobacco: Never Used  . Alcohol use 8.4 oz/week    14 Cans of beer per week  . Drug use: No  . Sexual activity: Not on file

## 2017-03-05 ENCOUNTER — Other Ambulatory Visit: Payer: Self-pay | Admitting: Family Medicine

## 2017-03-19 ENCOUNTER — Ambulatory Visit (INDEPENDENT_AMBULATORY_CARE_PROVIDER_SITE_OTHER): Payer: Worker's Compensation | Admitting: Orthopedic Surgery

## 2017-03-19 DIAGNOSIS — M24671 Ankylosis, right ankle: Secondary | ICD-10-CM

## 2017-03-19 NOTE — Progress Notes (Signed)
Office Visit Note   Patient: Wesley Bridges           Date of Birth: 1970-11-13           MRN: [id number] Visit Date: 03/19/2017              Requested by: Leone Haven, MD [address] [place], [place] [address] PCP: Leone Haven, MD  No chief complaint on file.     HPI: Patient is a 46 year old gentleman who presents in follow-up status post a functional capacity evaluation for the right lower extremity. Patient states he still having trouble with his blood pressure is currently running 190/140 states that one blood pressure medicine dropped his blood pressure so O he had to be taken to the emergency room. Patient states his blood pressure is still unstable for consideration of surgery. He is status post his functional capacity evaluation. 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  Assessment & Plan: Visit Diagnoses:  1. Fibrosis of subtalar joint, right     Plan: Patient's functional capacity evaluation recommends a sedentary level of work a note was written as a permanent restriction for sedentary level of work patient will follow up when his blood pressure stabilized for evaluation for arthroscopic subtalar fusion.  Follow-Up Instructions: Return if symptoms worsen or fail to improve.   Ortho Exam  Patient is alert, oriented, no adenopathy, well-dressed, normal affect, normal respiratory effort. Examination patient ambulates with a meal. Her he has pain with weightbearing with his fracture boot. Review of the function capacity evaluation from work well systems shows the patient to be unable to climb standing walks stooped forward bend or perform any type of manual work. Patient's permanent restrictions is listed as a sedentary level of work for the [place] [employer] physical demand  level.  Imaging: No results found. No images are attached to the encounter.  Labs: Lab Results  Component Value Date   HGBA1C 6.0  01/23/2017   ESRSEDRATE 22 (H) 02/07/2017   ESRSEDRATE 33 (H) 02/01/2017   ESRSEDRATE 24 (H) 12/24/2016    Orders:  No orders of the defined types were placed in this encounter.  No orders of the defined types were placed in this encounter.    Procedures: No procedures performed  Clinical Data: No additional findings.  ROS:  All other systems negative, except as noted in the HPI. Review of Systems  Objective: Vital Signs: There were no vitals taken for this visit.  Specialty Comments:  No specialty comments available.  PMFS History: Patient Active Problem List   Diagnosis Date Noted  . Chronic sinusitis 02/18/2017  . Low TSH level 02/18/2017  . Major depression, single episode 09/16/2016  . Palpitations 09/16/2016  . Inguinal hernia 09/16/2016  . Fibrosis of subtalar joint, right 07/16/2016  . Acute upper respiratory infection 04/18/2016  . Joint pain 04/18/2016  . Enlarged prostate 03/22/2016  . Lightheadedness 12/15/2015  . Syncope 12/06/2015  . Injury of toe on left foot 10/31/2015  . Right ankle swelling 10/31/2015  . Esophageal reflux 10/31/2015  . Numbness 10/31/2015  . Hyperlipidemia 04/13/2015  . Essential hypertension 03/18/2015  . Headache 03/18/2015  . Atypical chest pain 03/18/2015   Past Medical History:  Diagnosis Date  . Asthma   . Chronic headaches   . GERD (gastroesophageal reflux disease)   . Hyperlipidemia   . Hypertension   . Subclinical hyperthyroidism     Family History  Problem Relation Age  of Onset  . Arthritis Unknown        parents  . Hypertension Mother   . Thyroid disease Mother   . Osteoporosis Mother   . Syncope episode Mother   . Hypertension Brother     Past Surgical History:  Procedure Laterality Date  . ANKLE FRACTURE SURGERY    . ANKLE FUSION    . FEMUR FRACTURE SURGERY    . LEFT HEART CATH AND CORONARY ANGIOGRAPHY N/A 01/08/2017   Procedure: Left Heart Cath and Coronary Angiography;  Surgeon: Nelva Bush, MD;  Location: [facility];  Service: Cardiovascular;  Laterality: N/A;  . MANDIBLE FRACTURE SURGERY     Social History   Occupational History  . Not on file.   Social History Main Topics  . Smoking status: Never Smoker  . Smokeless tobacco: Never Used  . Alcohol use 8.4 oz/week    14 Cans of beer per week  . Drug use: No  . Sexual activity: Not on file

## 2017-03-26 ENCOUNTER — Telehealth (INDEPENDENT_AMBULATORY_CARE_PROVIDER_SITE_OTHER): Payer: Self-pay

## 2017-03-26 ENCOUNTER — Other Ambulatory Visit: Payer: Self-pay | Admitting: Internal Medicine

## 2017-03-26 NOTE — Telephone Encounter (Signed)
Emailed the 02/21/17 and 03/19/17 office notes to the case mgr per her request

## 2017-03-27 ENCOUNTER — Ambulatory Visit: Payer: BLUE CROSS/BLUE SHIELD | Admitting: Nurse Practitioner

## 2017-03-27 ENCOUNTER — Ambulatory Visit: Payer: Self-pay | Admitting: Neurology

## 2017-04-03 ENCOUNTER — Other Ambulatory Visit: Payer: Self-pay | Admitting: Family Medicine

## 2017-04-15 ENCOUNTER — Telehealth (INDEPENDENT_AMBULATORY_CARE_PROVIDER_SITE_OTHER): Payer: Self-pay | Admitting: Orthopedic Surgery

## 2017-04-15 NOTE — Telephone Encounter (Signed)
02/21/2017 ov note emailed to [employer]

## 2017-04-17 ENCOUNTER — Telehealth (INDEPENDENT_AMBULATORY_CARE_PROVIDER_SITE_OTHER): Payer: Self-pay

## 2017-04-17 NOTE — Telephone Encounter (Signed)
Received following email from case mgr and I emailed her back the notes she requested  Can you please send me the last 2 OVN from 02/21/17 and 03/19/17 Thanks. Acquanetta Belling RN BSN CCN Medical Case manager [employer]  "[employer]" Direct: [phone] T: [phone] F: [phone] Email: [email or web address] Referrals: [email or web address] .[email or web address] [email or web address]

## 2017-04-24 ENCOUNTER — Ambulatory Visit: Payer: BLUE CROSS/BLUE SHIELD | Admitting: Family Medicine

## 2017-06-22 IMAGING — CR DG CHEST 2V
2 series · 2 of 2 positions shown · non-contrast
Comparison: None.

CLINICAL DATA: Short of breath.  Neck and shoulder pain

EXAM:
CHEST  2 VIEW

[chest pa]
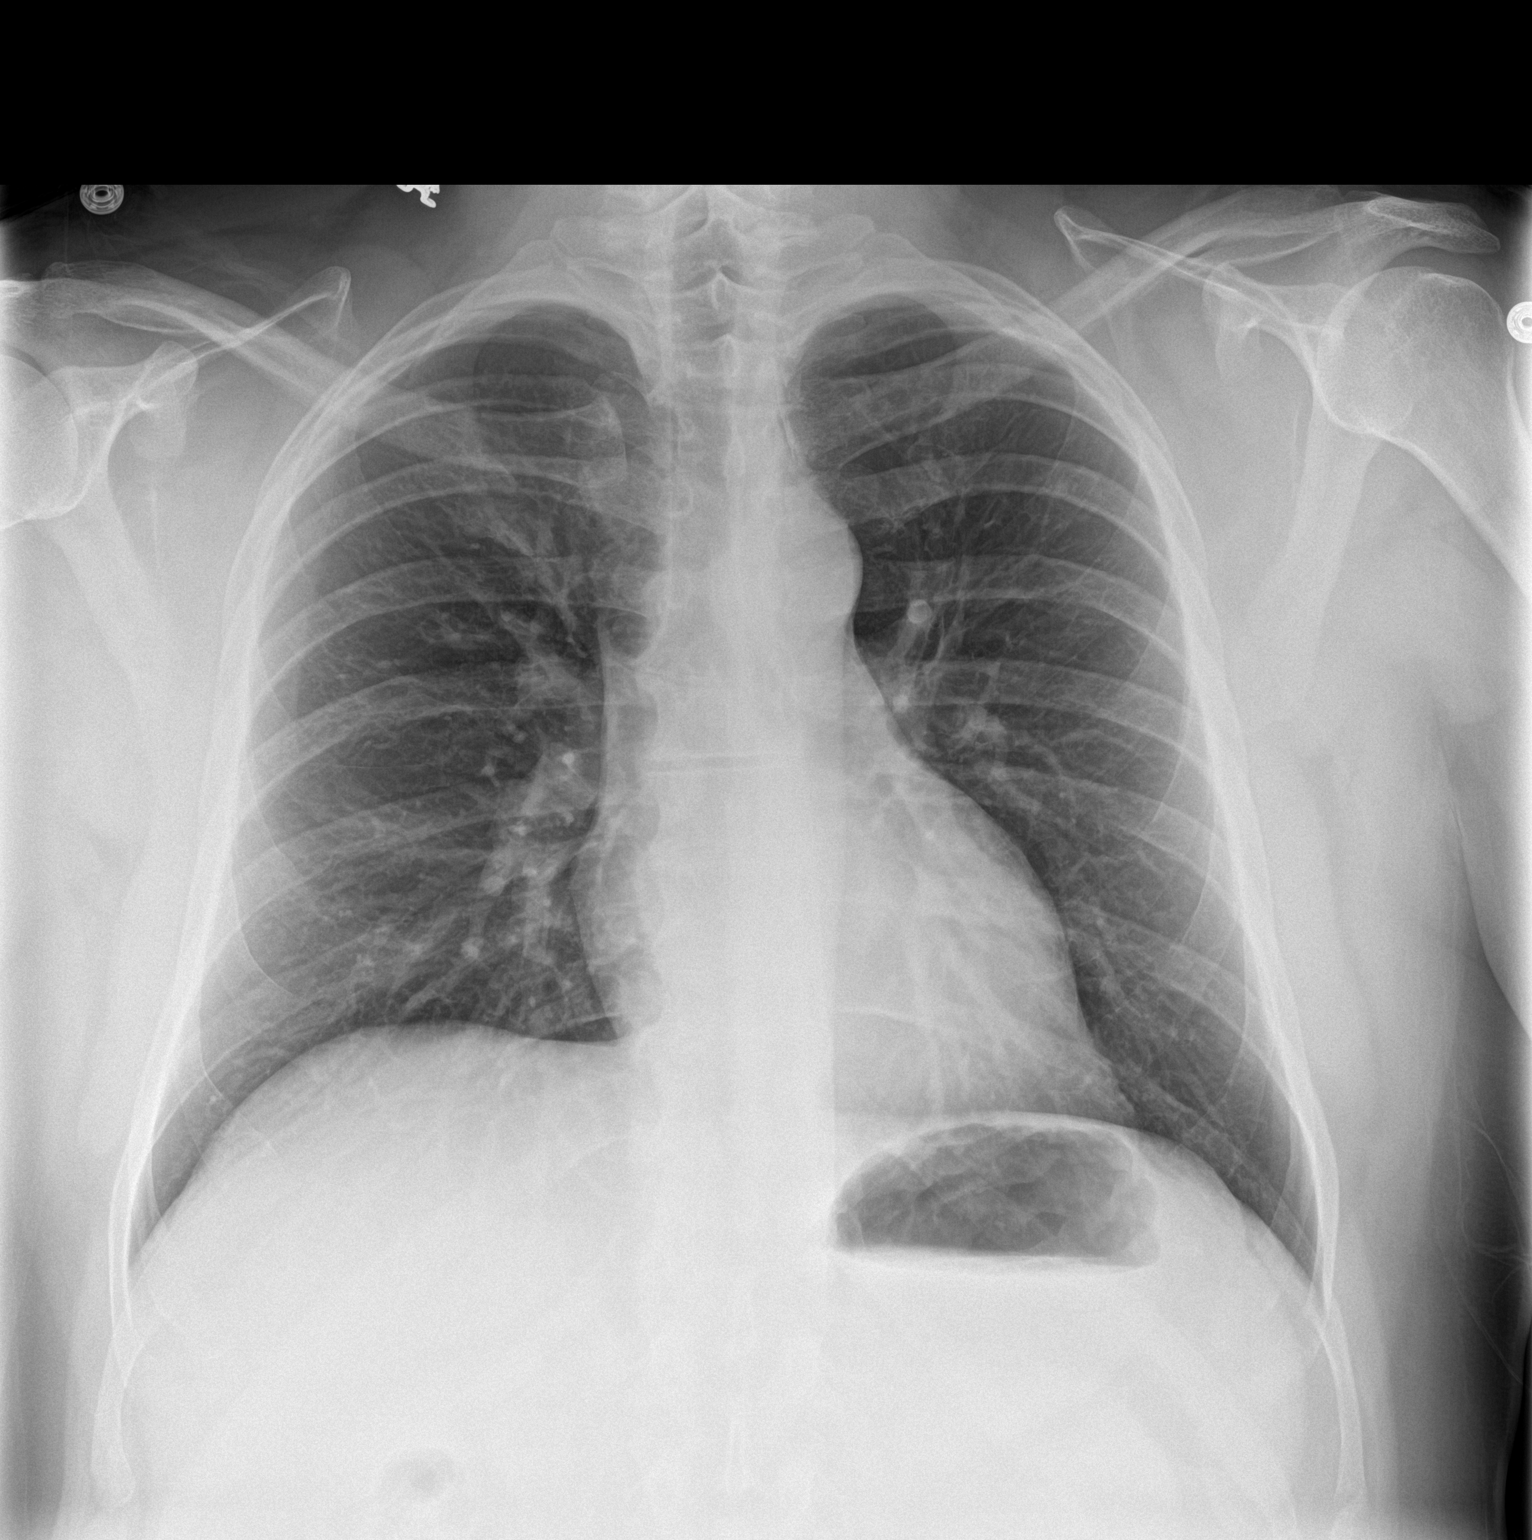

[chest lat]
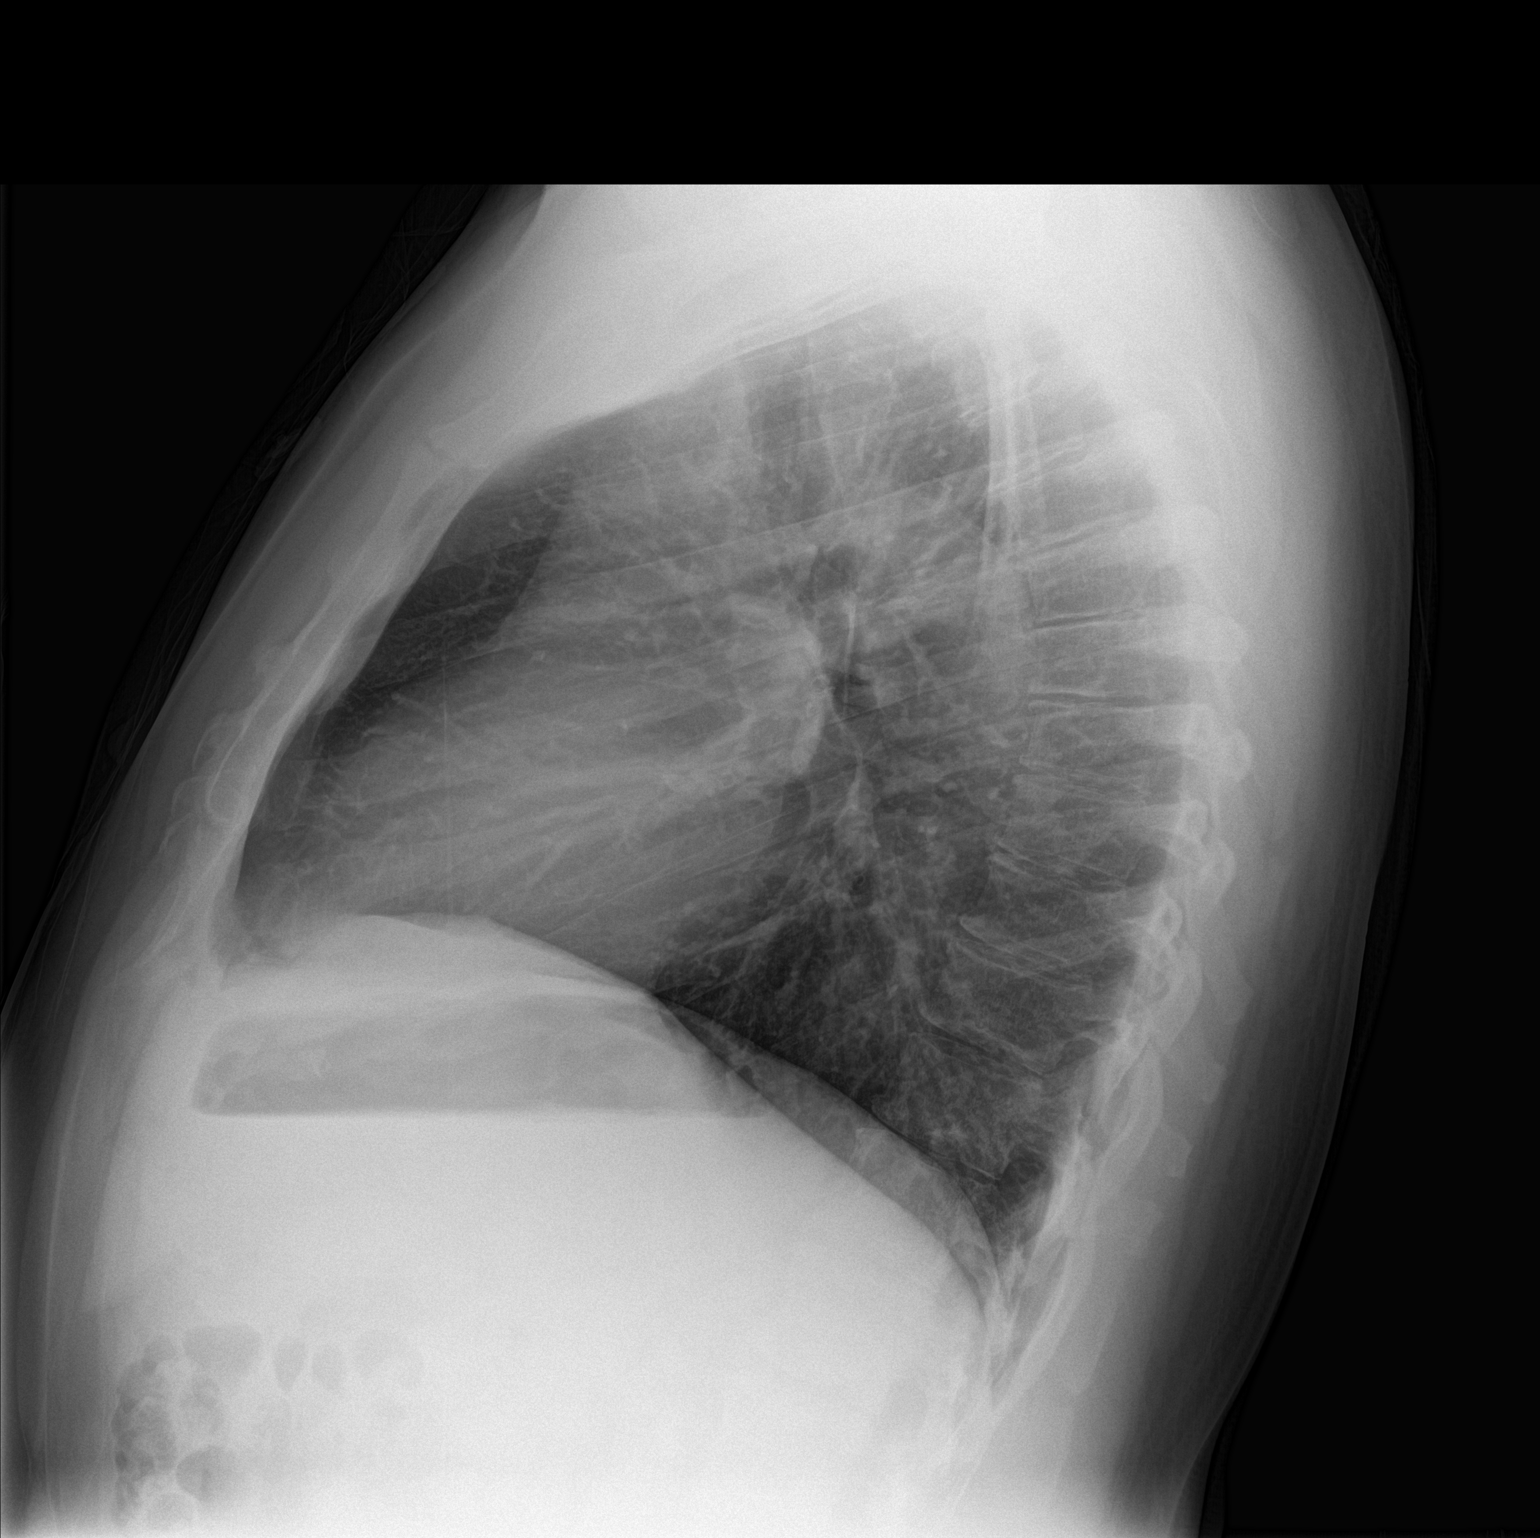

[2 of 2 positions shown; findings below may reference images not displayed]

FINDINGS: The heart size and mediastinal contours are within normal limits.
Both lungs are clear. The visualized skeletal structures are
unremarkable.
IMPRESSION: No active cardiopulmonary disease.

## 2017-06-25 ENCOUNTER — Other Ambulatory Visit: Payer: Self-pay | Admitting: Internal Medicine

## 2017-06-25 ENCOUNTER — Other Ambulatory Visit: Payer: Self-pay | Admitting: Family Medicine

## 2017-06-26 NOTE — Telephone Encounter (Signed)
Please advise if ok to refill nitro.

## 2017-07-05 ENCOUNTER — Ambulatory Visit: Payer: Self-pay | Admitting: Family Medicine

## 2017-07-05 ENCOUNTER — Other Ambulatory Visit: Payer: Self-pay | Admitting: Family Medicine

## 2017-07-05 ENCOUNTER — Other Ambulatory Visit: Payer: Self-pay | Admitting: *Deleted

## 2017-07-05 ENCOUNTER — Telehealth: Payer: Self-pay | Admitting: Family Medicine

## 2017-07-05 MED ORDER — ATORVASTATIN CALCIUM 40 MG PO TABS: 40.0000 mg | ORAL_TABLET | Freq: Every day | ORAL | 0 refills | Status: DC

## 2017-07-05 MED ORDER — NEBIVOLOL HCL 10 MG PO TABS: 10.0000 mg | ORAL_TABLET | Freq: Every day | ORAL | 3 refills | Status: DC

## 2017-07-05 MED ORDER — BUPROPION HCL ER (SR) 150 MG PO TB12: ORAL_TABLET | ORAL | 3 refills | Status: DC

## 2017-07-05 NOTE — Telephone Encounter (Signed)
Copied from [place]. Topic: Quick Communication - Rx Refill/Question >> Jul 05, 2017 10:52 AM Ahmed Prima L wrote: Medication: nebivolol (BYSTOLIC) 10 MG tablet & buPROPion (WELLBUTRIN SR) 150 MG 12 hr tablet &  atorvastatin (LIPITOR) 40 MG tablet  Has the patient contacted their pharmacy? yes & they were denied , he had an appt for today but has an emergency and has to go out of town, resch appt for 2/4   (Agent: If no, request that the patient contact the pharmacy for the refill.)   Preferred Pharmacy (with phone number or street name): [facility] [phone] - [place], [place] AT [facility] [address]   Agent: Please be advised that RX refills may take up to 3 business days. We ask that you follow-up with your pharmacy.

## 2017-07-05 NOTE — Telephone Encounter (Signed)
Please advise, I have already refilled Wellbutrin. Last filled Bystolic by Dr.End 06/09/2445 90 3rf

## 2017-07-05 NOTE — Addendum Note (Signed)
Addended by: Corky Sox E on: 07/05/2017 11:28 AM   Modules accepted: Orders

## 2017-07-05 NOTE — Telephone Encounter (Signed)
Called pt to call his cardiologist to fill Bystolic and the bupropion has refills   There are refills on bupropion LR: 02/01/17 #60 3 RF  OV 02/01/17   Atorvastatin LR: 04/03/17 #90 0 refills refilled x 1 month

## 2017-07-05 NOTE — Telephone Encounter (Signed)
Copied from [place]. Topic: Quick Communication - Rx Refill/Question >> Jul 05, 2017 10:52 AM Wesley Bridges wrote:  Medication: nebivolol (BYSTOLIC) 10 MG tablet & buPROPion (WELLBUTRIN SR) 150 MG 12 hr tablet &  atorvastatin (LIPITOR) 40 MG tablet  Has the patient contacted their pharmacy?  yes & they were denied , he had an appt for today but has an emergency and has to go out of town, resch appt for 2/4   (Agent: If no, request that the patient contact the pharmacy for the refill.)   Preferred Pharmacy (with phone number or street name) [facility] [phone] - [place], [address] AT [place]: Please be advised that RX refills may take up to 3 business days. We ask that you follow-up with your pharmacy.

## 2017-07-05 NOTE — Telephone Encounter (Signed)
Please advise 

## 2017-07-05 NOTE — Telephone Encounter (Signed)
Sent to pharmacy. Patient needs to keep follow-up next month. Thanks.

## 2017-07-09 ENCOUNTER — Telehealth: Payer: Self-pay | Admitting: *Deleted

## 2017-07-09 NOTE — Telephone Encounter (Signed)
Prior Authorization for [employer] submitted via [email or web address]. Key [id number]. Awaiting approval.

## 2017-07-18 MED ORDER — CARVEDILOL 6.25 MG PO TABS: 6.2500 mg | ORAL_TABLET | Freq: Two times a day (BID) | ORAL | 3 refills | Status: DC

## 2017-07-18 NOTE — Telephone Encounter (Signed)
Patient returning call.

## 2017-07-18 NOTE — Telephone Encounter (Signed)
We try carvedilol 6.25 mg BID in place of nebivolol.  Nelva Bush, MD [facility] Pager: [phone]

## 2017-07-18 NOTE — Telephone Encounter (Signed)
Prior auth of Bystolic was denied. Patient has only tried one of the alternative medications (metoprolol). Patient must try two of the alternative medications prior to receiving authorization for Bystolic.  No answer. Left message to call back.   Routing to Dr End to let him know.

## 2017-07-18 NOTE — Telephone Encounter (Signed)
S/w patient. He verbalized understanding to stop bystolic and start carvedilol 6.25 mg twice a day. Rx sent to pharmacy.

## 2017-07-18 NOTE — Addendum Note (Signed)
Addended by: Vanessa Ralphs on: 07/18/2017 10:51 AM   Modules accepted: Orders

## 2017-07-29 ENCOUNTER — Other Ambulatory Visit: Payer: Self-pay

## 2017-07-29 ENCOUNTER — Encounter: Payer: Self-pay | Admitting: Family Medicine

## 2017-07-29 ENCOUNTER — Ambulatory Visit: Payer: BLUE CROSS/BLUE SHIELD | Admitting: Family Medicine

## 2017-07-29 VITALS — BP 119/88 | HR 94 | Temp 98.7°F | Wt 232.0 lb

## 2017-07-29 DIAGNOSIS — I10 Essential (primary) hypertension: Secondary | ICD-10-CM

## 2017-07-29 DIAGNOSIS — R109 Unspecified abdominal pain: Secondary | ICD-10-CM | POA: Diagnosis not present

## 2017-07-29 DIAGNOSIS — H539 Unspecified visual disturbance: Secondary | ICD-10-CM | POA: Diagnosis not present

## 2017-07-29 DIAGNOSIS — G8929 Other chronic pain: Secondary | ICD-10-CM | POA: Diagnosis not present

## 2017-07-29 DIAGNOSIS — K4020 Bilateral inguinal hernia, without obstruction or gangrene, not specified as recurrent: Secondary | ICD-10-CM

## 2017-07-29 DIAGNOSIS — M25471 Effusion, right ankle: Secondary | ICD-10-CM

## 2017-07-29 DIAGNOSIS — R200 Anesthesia of skin: Secondary | ICD-10-CM

## 2017-07-29 DIAGNOSIS — E785 Hyperlipidemia, unspecified: Secondary | ICD-10-CM | POA: Diagnosis not present

## 2017-07-29 LAB — COMPREHENSIVE METABOLIC PANEL
ALT: 26 U/L (ref 0–53)
AST: 20 U/L (ref 0–37)
Albumin: 4.1 g/dL (ref 3.5–5.2)
Alkaline Phosphatase: 82 U/L (ref 39–117)
BUN: 11 mg/dL (ref 6–23)
CALCIUM: 9.4 mg/dL (ref 8.4–10.5)
CHLORIDE: 102 meq/L (ref 96–112)
CO2: 28 meq/L (ref 19–32)
Creatinine, Ser: 0.89 mg/dL (ref 0.40–1.50)
GFR: 96.98 mL/min (ref >60.00)
Glucose, Bld: 87 mg/dL (ref 70–99)
POTASSIUM: 3.7 meq/L (ref 3.5–5.1)
Sodium: 139 mEq/L (ref 135–145)
Total Bilirubin: 0.5 mg/dL (ref 0.2–1.2)
Total Protein: 8.1 g/dL (ref 6.0–8.3)

## 2017-07-29 MED ORDER — LISINOPRIL 40 MG PO TABS: 40.0000 mg | ORAL_TABLET | Freq: Every day | ORAL | 3 refills | Status: DC

## 2017-07-29 NOTE — Assessment & Plan Note (Signed)
Chronic intermittent issue.  It does not look like he actually saw neurology previously.  This was not discussed with him at his office visit.  We will follow-up with him to see if he would like to see neurology.

## 2017-07-29 NOTE — Assessment & Plan Note (Signed)
Chronic issue.  No obvious cause on prior CT scan.  Benign exam.  Will refer to GI.

## 2017-07-29 NOTE — Progress Notes (Signed)
[name], MD Phone: [phone]  Wesley Bridges is a 47 y.o. male who presents today for follow-up.  Hypertension: Running around 150/100 at home.  He is taking lisinopril 20 mg daily and carvedilol.  No chest pain or shortness of breath.  No edema.  Notes headaches that are unchanged.  Does have chronic intermittent numbness in his left upper extremity has been going on long time now.  This is unchanged.  He has no symptoms now.  No weakness.  No headaches.  No chest pain.  No trouble breathing.  Hyperlipidemia: Taking Lipitor.  No right upper quadrant pain.  No myalgias.  Patient reports chronic left upper quadrant pain that comes and goes.  Sharp in nature.  He had a CT scan in late 2017 that did not a specific cause.  No family history of colon cancer.  No blood in stool.  Has a bowel movement daily.  Patient reports he has been following with an eye doctor for several years.  Placed in bifocals last year.  Notes near vision is slightly worse than it was previously.  He does not have an appointment to see them yet.  He continues to follow with orthopedics for his right ankle.  Notes they are not doing anything now for it.  They have put him on disability.  On prior CT scan noted to have bilateral inguinal hernias and also a lipoma.  Patient notes occasional bulge in the right groin that goes away.  No pain in this area or his other groin.  Social History   Tobacco Use  Smoking Status Never Smoker  Smokeless Tobacco Never Used     ROS see history of present illness  Objective  Physical Exam Vitals:   07/29/17 1312 07/29/17 1347  BP: (!) 180/120 119/88  Pulse: 94   Temp: 98.7 F (37.1 C)   SpO2: 94%     BP Readings from Last 3 Encounters:  07/29/17 119/88  02/18/17 114/80  02/13/17 (!) 161/112   Wt Readings from Last 3 Encounters:  07/29/17 232 lb (105.2 kg)  02/18/17 221 lb 3.2 oz (100.3 kg)  02/13/17 221 lb 12 oz (100.6 kg)    Physical Exam    Constitutional: No distress.  Eyes: Conjunctivae are normal. Pupils are equal, round, and reactive to light.  Cardiovascular: Normal rate, regular rhythm and normal heart sounds.  Pulmonary/Chest: Effort normal and breath sounds normal.  Abdominal: Soft. Bowel sounds are normal. He exhibits no distension. There is no tenderness. There is no rebound and no guarding.  Genitourinary:  Genitourinary Comments: Normal circumcised penis, testicles slightly retracted though normal on palpation, normal scrotum, no inguinal hernia, no palpable defects of the anterior pelvic wall or over his upper thighs  Musculoskeletal: He exhibits no edema.  Neurological: He is alert. Gait normal.  Skin: Skin is warm and dry. He is not diaphoretic.     Assessment/Plan: Please see individual problem list.  Essential hypertension Blood pressure elevated on initial check though I am not sure I believe that blood pressure given how much improved it was.  His blood pressures at home are uncontrolled.  We will check lab work today and in 1 week.  We will increase his lisinopril to 40 mg.  Right ankle swelling He will continue to follow with orthopedics.  Numbness Chronic intermittent issue.  It does not look like he actually saw neurology previously.  This was not discussed with him at his office visit.  We will follow-up with him  to see if he would like to see neurology.  Inguinal hernia Benign exam.  Patient is now ready to see general surgery.  Chronic abdominal pain Chronic issue.  No obvious cause on prior CT scan.  Benign exam.  Will refer to GI.  Vision changes Near vision changes.  Vision screening acceptable.  Discussed seeing his eye doctor sooner than scheduled.  Hyperlipidemia Continue Lipitor.   Orders Placed This Encounter  Procedures  . Comp Met (CMET)  . Basic Metabolic Panel (BMET)    Standing Status:   Future    Standing Expiration Date:   07/29/2018  . Ambulatory referral to General  Surgery    Referral Priority:   Routine    Referral Type:   Surgical    Referral Reason:   Specialty Services Required    Requested Specialty:   General Surgery    Number of Visits Requested:   1  . Ambulatory referral to Gastroenterology    Referral Priority:   Routine    Referral Type:   Consultation    Referral Reason:   Specialty Services Required    Number of Visits Requested:   1    Meds ordered this encounter  Medications  . lisinopril (PRINIVIL,ZESTRIL) 40 MG tablet    Sig: Take 1 tablet (40 mg total) by mouth daily.    Dispense:  90 tablet    Refill:  [place], MD [place]

## 2017-07-29 NOTE — Assessment & Plan Note (Signed)
Blood pressure elevated on initial check though I am not sure I believe that blood pressure given how much improved it was.  His blood pressures at home are uncontrolled.  We will check lab work today and in 1 week.  We will increase his lisinopril to 40 mg.

## 2017-07-29 NOTE — Assessment & Plan Note (Addendum)
Near vision changes.  Vision screening acceptable.  Discussed seeing his eye doctor sooner than scheduled.

## 2017-07-29 NOTE — Patient Instructions (Signed)
Nice to see you. We will check lab work today. We will increase your lisinopril. Please see your eye doctor. We will get you to see GI for your abdominal discomfort.

## 2017-07-29 NOTE — Assessment & Plan Note (Signed)
Benign exam.  Patient is now ready to see general surgery.

## 2017-07-29 NOTE — Assessment & Plan Note (Signed)
He will continue to follow with orthopedics. 

## 2017-07-29 NOTE — Assessment & Plan Note (Signed)
-  Continue Lipitor °

## 2017-07-30 ENCOUNTER — Encounter: Payer: Self-pay | Admitting: *Deleted

## 2017-08-04 ENCOUNTER — Other Ambulatory Visit: Payer: Self-pay | Admitting: Family Medicine

## 2017-08-06 ENCOUNTER — Ambulatory Visit: Payer: BLUE CROSS/BLUE SHIELD

## 2017-08-06 ENCOUNTER — Other Ambulatory Visit: Payer: BLUE CROSS/BLUE SHIELD

## 2017-08-10 ENCOUNTER — Other Ambulatory Visit: Payer: Self-pay | Admitting: Family Medicine

## 2017-08-13 ENCOUNTER — Encounter: Payer: Self-pay | Admitting: Family Medicine

## 2017-08-15 MED ORDER — OSELTAMIVIR PHOSPHATE 75 MG PO CAPS: 75.0000 mg | ORAL_CAPSULE | Freq: Two times a day (BID) | ORAL | 0 refills | Status: DC

## 2017-08-20 ENCOUNTER — Encounter: Payer: Self-pay | Admitting: General Surgery

## 2017-08-20 ENCOUNTER — Ambulatory Visit: Payer: Self-pay | Admitting: General Surgery

## 2017-08-26 ENCOUNTER — Ambulatory Visit: Payer: BLUE CROSS/BLUE SHIELD | Admitting: Gastroenterology

## 2017-08-28 ENCOUNTER — Ambulatory Visit (INDEPENDENT_AMBULATORY_CARE_PROVIDER_SITE_OTHER): Payer: BLUE CROSS/BLUE SHIELD | Admitting: *Deleted

## 2017-08-28 ENCOUNTER — Other Ambulatory Visit (INDEPENDENT_AMBULATORY_CARE_PROVIDER_SITE_OTHER): Payer: BLUE CROSS/BLUE SHIELD

## 2017-08-28 VITALS — BP 132/88 | HR 75 | Resp 18

## 2017-08-28 DIAGNOSIS — I10 Essential (primary) hypertension: Secondary | ICD-10-CM | POA: Diagnosis not present

## 2017-08-28 LAB — BASIC METABOLIC PANEL
BUN: 16 mg/dL (ref 6–23)
CHLORIDE: 108 meq/L (ref 96–112)
CO2: 27 mEq/L (ref 19–32)
CREATININE: 0.88 mg/dL (ref 0.40–1.50)
Calcium: 9.7 mg/dL (ref 8.4–10.5)
GFR: 98.22 mL/min (ref >60.00)
Glucose, Bld: 103 mg/dL — ABNORMAL HIGH (ref 70–99)
POTASSIUM: 4.2 meq/L (ref 3.5–5.1)
SODIUM: 140 meq/L (ref 135–145)

## 2017-08-28 NOTE — Progress Notes (Signed)
Patient came into office for BP check 2 weeks after increasing lisinopril to 40 mg , patient also taking Coreg 6.25 mg . Patient BP taken in left arm 132/88 pulse 75 .

## 2017-09-01 ENCOUNTER — Other Ambulatory Visit: Payer: Self-pay | Admitting: Family Medicine

## 2017-09-01 ENCOUNTER — Other Ambulatory Visit: Payer: Self-pay | Admitting: Internal Medicine

## 2017-09-01 NOTE — Progress Notes (Signed)
BP not at goal though it is improved.  I would suggest that we increase his carvedilol. If he is willing I can send a new prescription to his pharmacy.

## 2017-09-02 ENCOUNTER — Telehealth: Payer: Self-pay | Admitting: Internal Medicine

## 2017-09-02 NOTE — Progress Notes (Signed)
Patient is willing to try increasing the carvedilol.

## 2017-09-02 NOTE — Telephone Encounter (Signed)
Received Disability Forms to be completed Sent through interoffice mail to [place] patient and left message to inform him we are sending a packet with a release form to be signed and there will be a $25 processing fee to be sent to [employer] packet to patient

## 2017-09-03 ENCOUNTER — Encounter: Payer: Self-pay | Admitting: *Deleted

## 2017-09-03 MED ORDER — CARVEDILOL 12.5 MG PO TABS: 12.5000 mg | ORAL_TABLET | Freq: Two times a day (BID) | ORAL | 1 refills | Status: DC

## 2017-09-03 NOTE — Progress Notes (Signed)
Dose increased.  This was sent to pharmacy.  If he develops fatigue or tiredness on this or lightheadedness he needs to let us know.  He should follow-up in 2-3 weeks for BP check with nursing.

## 2017-09-03 NOTE — Addendum Note (Signed)
Addended by: Leone Haven on: 09/03/2017 01:21 PM   Modules accepted: Orders

## 2017-09-04 ENCOUNTER — Telehealth: Payer: Self-pay

## 2017-09-04 NOTE — Telephone Encounter (Signed)
Left message to return call to give patient the phone number for [facility] to reschedule his missed appointment Phone:  [phone]   [facility] for pec to speak to patient

## 2017-09-04 NOTE — Telephone Encounter (Signed)
-----   Message from Leone Haven, MD sent at 09/03/2017  5:15 PM EDT ----- Regarding: FW: No Show Please contact the patient and give him the number to contact Dr Dwyane Luo office to schedule an appointment due to no show. Thanks. Randall Hiss.    ----- Message ----- From: Carson Myrtle, RN Sent: 09/03/2017   3:50 PM To: Leone Haven, MD Subject: No Show

## 2017-09-09 NOTE — Telephone Encounter (Signed)
patient is scheduled with GI

## 2017-09-11 ENCOUNTER — Encounter: Payer: Self-pay | Admitting: Gastroenterology

## 2017-09-11 ENCOUNTER — Ambulatory Visit: Payer: BLUE CROSS/BLUE SHIELD | Admitting: Gastroenterology

## 2017-09-15 ENCOUNTER — Encounter: Payer: Self-pay | Admitting: Family Medicine

## 2017-09-16 ENCOUNTER — Ambulatory Visit (INDEPENDENT_AMBULATORY_CARE_PROVIDER_SITE_OTHER): Payer: BLUE CROSS/BLUE SHIELD | Admitting: Family Medicine

## 2017-09-16 ENCOUNTER — Encounter: Payer: Self-pay | Admitting: Family Medicine

## 2017-09-16 ENCOUNTER — Ambulatory Visit (INDEPENDENT_AMBULATORY_CARE_PROVIDER_SITE_OTHER): Payer: BLUE CROSS/BLUE SHIELD

## 2017-09-16 VITALS — BP 120/78 | HR 72 | Temp 98.9°F | Ht 67.0 in | Wt 219.4 lb

## 2017-09-16 DIAGNOSIS — L0100 Impetigo, unspecified: Secondary | ICD-10-CM

## 2017-09-16 DIAGNOSIS — J209 Acute bronchitis, unspecified: Secondary | ICD-10-CM

## 2017-09-16 MED ORDER — AZITHROMYCIN 250 MG PO TABS: ORAL_TABLET | ORAL | 0 refills | Status: DC

## 2017-09-16 MED ORDER — HYDROCODONE-HOMATROPINE 5-1.5 MG/5ML PO SYRP: 5.0000 mL | ORAL_SOLUTION | Freq: Three times a day (TID) | ORAL | 0 refills | Status: DC | PRN

## 2017-09-16 MED ORDER — MUPIROCIN CALCIUM 2 % EX CREA: 1.0000 "application " | TOPICAL_CREAM | Freq: Two times a day (BID) | CUTANEOUS | 0 refills | Status: DC

## 2017-09-16 MED ORDER — PREDNISONE 10 MG PO TABS: ORAL_TABLET | ORAL | 0 refills | Status: DC

## 2017-09-16 NOTE — Patient Instructions (Addendum)
Please take medication as directed and follow up if symptoms do not improve with treatment, worsen, or you develop a fever >101 or shortness of breath.  It was great seeing you today.  I have sent in a prescription for prednisone. Take 4 tablets once daily for 2 days, 3 tabs daily for 2 days, 2 tabs daily for 2 days, 1 tab daily for 2 days.  Use the cough syrup at night.   During the day, Delsym and/or Mucinex is best.    Bactroban for lesions on mouth.   Acute Bronchitis Bronchitis is inflammation of the airways that extend from the windpipe into the lungs (bronchi). The inflammation often causes mucus to develop. This leads to a cough, which is the most common symptom of bronchitis.  In acute bronchitis, the condition usually develops suddenly and goes away over time, usually in a couple weeks. Smoking, allergies, and asthma can make bronchitis worse. Repeated episodes of bronchitis may cause further lung problems.  CAUSES Acute bronchitis is most often caused by the same virus that causes a cold. The virus can spread from person to person (contagious) through coughing, sneezing, and touching contaminated objects. SIGNS AND SYMPTOMS   Cough.   Fever.   Coughing up mucus.   Body aches.   Chest congestion.   Chills.   Shortness of breath.   Sore throat.  DIAGNOSIS  Acute bronchitis is usually diagnosed through a physical exam. Your health care provider will also ask you questions about your medical history. Tests, such as chest X-rays, are sometimes done to rule out other conditions.  TREATMENT  Acute bronchitis usually goes away in a couple weeks. Oftentimes, no medical treatment is necessary. Medicines are sometimes given for relief of fever or cough. Antibiotic medicines are usually not needed but may be prescribed in certain situations. In some cases, an inhaler may be recommended to help reduce shortness of breath and control the cough. A cool mist vaporizer may  also be used to help thin bronchial secretions and make it easier to clear the chest.  HOME CARE INSTRUCTIONS  Get plenty of rest.   Drink enough fluids to keep your urine clear or pale yellow (unless you have a medical condition that requires fluid restriction). Increasing fluids may help thin your respiratory secretions (sputum) and reduce chest congestion, and it will prevent dehydration.   Take medicines only as directed by your health care provider.  If you were prescribed an antibiotic medicine, finish it all even if you start to feel better.  Avoid smoking and secondhand smoke. Exposure to cigarette smoke or irritating chemicals will make bronchitis worse. If you are a smoker, consider using nicotine gum or skin patches to help control withdrawal symptoms. Quitting smoking will help your lungs heal faster.   Reduce the chances of another bout of acute bronchitis by washing your hands frequently, avoiding people with cold symptoms, and trying not to touch your hands to your mouth, nose, or eyes.   Keep all follow-up visits as directed by your health care provider.  SEEK MEDICAL CARE IF: Your symptoms do not improve after 1 week of treatment.  SEEK IMMEDIATE MEDICAL CARE IF:  You develop an increased fever or chills.   You have chest pain.   You have severe shortness of breath.  You have bloody sputum.   You develop dehydration.  You faint or repeatedly feel like you are going to pass out.  You develop repeated vomiting.  You develop a severe headache. MAKE  SURE YOU:   Understand these instructions.  Will watch your condition.  Will get help right away if you are not doing well or get worse.   This information is not intended to replace advice given to you by your health care provider. Make sure you discuss any questions you have with your health care provider.   Document Released: 07/19/2004 Document Revised: 07/02/2014 Document Reviewed:  12/02/2012 Elsevier Interactive Patient Education 2016 [employer]     Acute Bronchitis Bronchitis is inflammation of the airways that extend from the windpipe into the lungs (bronchi). The inflammation often causes mucus to develop. This leads to a cough, which is the most common symptom of bronchitis.  In acute bronchitis, the condition usually develops suddenly and goes away over time, usually in a couple weeks. Smoking, allergies, and asthma can make bronchitis worse. Repeated episodes of bronchitis may cause further lung problems.  CAUSES Acute bronchitis is most often caused by the same virus that causes a cold. The virus can spread from person to person (contagious) through coughing, sneezing, and touching contaminated objects. SIGNS AND SYMPTOMS   Cough.   Fever.   Coughing up mucus.   Body aches.   Chest congestion.   Chills.   Shortness of breath.   Sore throat.  DIAGNOSIS  Acute bronchitis is usually diagnosed through a physical exam. Your health care provider will also ask you questions about your medical history. Tests, such as chest X-rays, are sometimes done to rule out other conditions.  TREATMENT  Acute bronchitis usually goes away in a couple weeks. Oftentimes, no medical treatment is necessary. Medicines are sometimes given for relief of fever or cough. Antibiotic medicines are usually not needed but may be prescribed in certain situations. In some cases, an inhaler may be recommended to help reduce shortness of breath and control the cough. A cool mist vaporizer may also be used to help thin bronchial secretions and make it easier to clear the chest.  HOME CARE INSTRUCTIONS  Get plenty of rest.   Drink enough fluids to keep your urine clear or pale yellow (unless you have a medical condition that requires fluid restriction). Increasing fluids may help thin your respiratory secretions (sputum) and reduce chest congestion, and it will prevent  dehydration.   Take medicines only as directed by your health care provider.  If you were prescribed an antibiotic medicine, finish it all even if you start to feel better.  Avoid smoking and secondhand smoke. Exposure to cigarette smoke or irritating chemicals will make bronchitis worse. If you are a smoker, consider using nicotine gum or skin patches to help control withdrawal symptoms. Quitting smoking will help your lungs heal faster.   Reduce the chances of another bout of acute bronchitis by washing your hands frequently, avoiding people with cold symptoms, and trying not to touch your hands to your mouth, nose, or eyes.   Keep all follow-up visits as directed by your health care provider.  SEEK MEDICAL CARE IF: Your symptoms do not improve after 1 week of treatment.  SEEK IMMEDIATE MEDICAL CARE IF:  You develop an increased fever or chills.   You have chest pain.   You have severe shortness of breath.  You have bloody sputum.   You develop dehydration.  You faint or repeatedly feel like you are going to pass out.  You develop repeated vomiting.  You develop a severe headache. MAKE SURE YOU:   Understand these instructions.  Will watch your condition.  Will  get help right away if you are not doing well or get worse.   This information is not intended to replace advice given to you by your health care provider. Make sure you discuss any questions you have with your health care provider.   Document Released: 07/19/2004 Document Revised: 07/02/2014 Document Reviewed: 12/02/2012 Elsevier Interactive Patient Education [employer].

## 2017-09-16 NOTE — Progress Notes (Signed)
Subjective:    Patient ID: Wesley Bridges, male    DOB: 10-11-1970, 47 y.o.   MRN: [id number]  HPI  Wesley Bridges is a 47 year old male who presents today with a cough that has been present for one month. He reports exposure to flu with his children prior to cough and symptoms of myalgias, nasal congestion, and rhinitis. He was treated with Tamiflu which provided moderate benefit.   Cough remains and he reports that sputum has been brown and he believes that he has seen a streak of blood this morning only. He reports that he coughs intermittently during the day and it is nonproductive. Associated nasal congestion and rhinitis have improved. Associated blister like sore on lips and under nose that occurred 2 days ago. He denies pruritis, pain, or drainage. No treatments have been tried at home.   Onset: One month ago  Trigger: Exposure to flu developed symptoms of cough  Course: He reports improvement and then symptom of cough returned.  Treatment/efficacy: Tamiflu has provided moderate benefit  Associated symptoms: Fever/Chills/Sweats: Not today, this has improved  Facial Pain: No Frontal Headaches: No Nasal congestion/purulence: No Dental pain: No Sore throat: No Ear pain/pressure:Right ear pressure Itchy/watery eyes: No Cough: Yes Pleuritic pain: No Dyspnea: No  Wheezing: Yes  ACE Inhibitor: Lisinopril History of asthma/bronchitis/COPD: He reports a history of asthma as a child. Denies history of bronchitis/COPD. Recent sick contact exposure: Yes, children have been diagnosed with upper respiratory infection last week.  Recent antibiotic use: No GI symptoms of dyspepsia, dysphagia, or reflux:  History of GERD with daily protonix; no symptoms of reflux noted.   History of chronic sinusitis. He is not a smoker   Review of Systems  Constitutional: Negative for chills, fatigue and fever.  HENT: Negative for postnasal drip, sinus pressure, sinus pain, sneezing and  sore throat.        Sore on lip and under nose. Right ear pressure  Respiratory: Positive for cough. Negative for shortness of breath and wheezing.   Cardiovascular: Negative for chest pain and palpitations.  Gastrointestinal: Negative for abdominal pain, diarrhea, nausea and vomiting.  Musculoskeletal: Negative for myalgias.  Skin: Negative for rash.  Neurological: Negative for headaches.   Past Medical History:  Diagnosis Date  . Asthma   . Chronic headaches   . GERD (gastroesophageal reflux disease)   . Hyperlipidemia   . Hypertension   . Subclinical hyperthyroidism      Social History   Socioeconomic History  . Marital status: Married    Spouse name: Not on file  . Number of children: Not on file  . Years of education: Not on file  . Highest education level: Not on file  Occupational History  . Not on file  Social Needs  . Financial resource strain: Not on file  . Food insecurity:    Worry: Not on file    Inability: Not on file  . Transportation needs:    Medical: Not on file    Non-medical: Not on file  Tobacco Use  . Smoking status: Never Smoker  . Smokeless tobacco: Never Used  Substance and Sexual Activity  . Alcohol use: Yes    Alcohol/week: 8.4 oz    Types: 14 Cans of beer per week  . Drug use: No  . Sexual activity: Not on file  Lifestyle  . Physical activity:    Days per week: Not on file    Minutes per session: Not on file  .  Stress: Not on file  Relationships  . Social connections:    Talks on phone: Not on file    Gets together: Not on file    Attends religious service: Not on file    Active member of club or organization: Not on file    Attends meetings of clubs or organizations: Not on file    Relationship status: Not on file  . Intimate partner violence:    Fear of current or ex partner: Not on file    Emotionally abused: Not on file    Physically abused: Not on file    Forced sexual activity: Not on file  Other Topics Concern  . Not  on file  Social History Narrative   Lives at home with family. Independent at baseline.    Past Surgical History:  Procedure Laterality Date  . ANKLE FRACTURE SURGERY    . ANKLE FUSION    . FEMUR FRACTURE SURGERY    . LEFT HEART CATH AND CORONARY ANGIOGRAPHY N/A 01/08/2017   Procedure: Left Heart Cath and Coronary Angiography;  Surgeon: Nelva Bush, MD;  Location: [facility];  Service: Cardiovascular;  Laterality: N/A;  . MANDIBLE FRACTURE SURGERY      Family History  Problem Relation Age of Onset  . Arthritis Unknown        parents  . Hypertension Mother   . Thyroid disease Mother   . Osteoporosis Mother   . Syncope episode Mother   . Hypertension Brother     Allergies  Allergen Reactions  . Pollen Extract     Current Outpatient Medications on File Prior to Visit  Medication Sig Dispense Refill  . acetaminophen (TYLENOL) 500 MG tablet Take 1,000 mg by mouth every 6 (six) hours as needed for headache.     Marland Kitchen aspirin EC 81 MG tablet Take 1 tablet (81 mg total) by mouth daily. 90 tablet 3  . atorvastatin (LIPITOR) 40 MG tablet TAKE 1 TABLET BY MOUTH DAILY 30 tablet 0  . buPROPion (WELLBUTRIN SR) 150 MG 12 hr tablet Take 150 mg (one tablet) by mouth daily for 3 days, then increase to 150 mg (one tablet) sees by mouth twice daily 60 tablet 3  . carvedilol (COREG) 12.5 MG tablet Take 1 tablet (12.5 mg total) by mouth 2 (two) times daily. 180 tablet 1  . fluticasone (FLONASE) 50 MCG/ACT nasal spray U 2 SPRAYS IEN ONCE D  11  . ibuprofen (ADVIL,MOTRIN) 800 MG tablet TAKE 1 TABLET BY MOUTH 3 TIMES DAY AS NEEDED FOR PAIN  1  . lisinopril (PRINIVIL,ZESTRIL) 40 MG tablet Take 1 tablet (40 mg total) by mouth daily. 90 tablet 3  . loratadine (CLARITIN) 10 MG tablet Take 1 tablet (10 mg total) by mouth daily. (Patient taking differently: Take 20 mg by mouth daily. ) 30 tablet 11  . metoprolol tartrate (LOPRESSOR) 25 MG tablet TK 1 T PO BID  1  . Multiple Vitamin  (MULTIVITAMIN WITH MINERALS) TABS tablet Take 1 tablet by mouth at bedtime.    . nitroGLYCERIN (NITROSTAT) 0.4 MG SL tablet DISSOLVE 1 TABLET UNDER THE TONGUE EVERY 5 MINUTES AS NEEDED FOR CHEST PAIN. MAXIMUM DAILY DOSE IS 3 TABLETS 25 tablet 0  . oseltamivir (TAMIFLU) 75 MG capsule Take 1 capsule (75 mg total) by mouth 2 (two) times daily. 10 capsule 0  . pantoprazole (PROTONIX) 40 MG tablet TAKE 1 TABLET BY MOUTH AT BEDTIME 90 tablet 0  . sertraline (ZOLOFT) 100 MG tablet TAKE 1 TABLET  BY MOUTH DAILY 90 tablet 1   No current facility-administered medications on file prior to visit.     BP 120/78 (BP Location: Left Arm, Patient Position: Sitting, Cuff Size: Normal)   Pulse 72   Temp 98.9 F (37.2 C) (Oral)   Ht 5\' 7"  (1.702 m)   Wt 219 lb 6.4 oz (99.5 kg)   SpO2 97%   BMI 34.36 kg/m       Objective:   Physical Exam  Constitutional: He is oriented to person, place, and time. He appears well-developed and well-nourished.  HENT:  Right Ear: Tympanic membrane normal.  Left Ear: Tympanic membrane normal.  Nose: No rhinorrhea. Right sinus exhibits no maxillary sinus tenderness and no frontal sinus tenderness. Left sinus exhibits no maxillary sinus tenderness and no frontal sinus tenderness.  Mouth/Throat: Oropharynx is clear and moist and mucous membranes are normal.  One papular lesion on lower lip and one vesicular lesion noted on medial cleft of upper lip. Minimal erythema surrounding lesions. No drainage or crusting noted.   Eyes: Pupils are equal, round, and reactive to light. No scleral icterus.  Neck: Neck supple.  Cardiovascular: Normal rate and regular rhythm.  Pulmonary/Chest: Effort normal and breath sounds normal. He has no rales.  Minimal scattered wheezing present  Abdominal: Soft. Bowel sounds are normal. There is no tenderness.  Lymphadenopathy:    He has no cervical adenopathy.  Neurological: He is alert and oriented to person, place, and time. Coordination normal.    Skin: Skin is warm and dry.       Assessment & Plan:  1. Acute bronchitis, unspecified organism Exam and history are most consistent with bronchitis. We discussed that symptoms are most likely viral in nature and less likely bacterial. Minimal scattered wheezing present; O2 sat 97%; VSS support treatment for bronchitis. We discussed that findings today are less likely due to a bacterial cause such as strep throat or pneumonia. We discussed treatment options and will proceed with prednisone taper, and a prescription for azithromycin was provided due to duration of symptoms today. Will obtain a chest X-ray today with report of blood streak in sputum and patient is interested in having imaging obtained. Provided cough suppressant. Checked substance abuse database, no suspicious activity noted. Advised that if his symptoms do not improve with treatment, follow up is recommended with PCP. Return precautions advised..   - azithromycin (ZITHROMAX) 250 MG tablet; Take 2 tablets at once today, then one tablet daily for 4 days  Dispense: 6 tablet; Refill: 0 - DG Chest 2 View; Future - HYDROcodone-homatropine (HYCODAN) 5-1.5 MG/5ML syrup; Take 5 mLs by mouth every 8 (eight) hours as needed for cough.  Dispense: 120 mL; Refill: 0 - predniSONE (DELTASONE) 10 MG tablet; Take 4 tablets once daily for 2 days, 3 tabs daily for 2 days, 2 tabs daily for 2 days, 1 tab daily for 2 days.  Dispense: 20 tablet; Refill: 0  2. Impetigo Limited lesions present. Will treat topically and advised close follow up if symptoms do not improve  - mupirocin cream (BACTROBAN) 2 %; Apply 1 application topically 2 (two) times daily.  Dispense: 15 g; Refill: 0  Delano Metz, FNP-C

## 2017-09-17 ENCOUNTER — Other Ambulatory Visit: Payer: Self-pay | Admitting: Family Medicine

## 2017-09-17 DIAGNOSIS — J181 Lobar pneumonia, unspecified organism: Principal | ICD-10-CM

## 2017-09-17 DIAGNOSIS — J189 Pneumonia, unspecified organism: Secondary | ICD-10-CM

## 2017-09-17 NOTE — Progress Notes (Signed)
Chest X-ray is suspicious for pneumonia. Complete antibiotic and follow up in one month for follow up X-ray to ensure resolution. Advised follow up soon if symptoms worsen and particularly if fever or shortness of breath develops.

## 2017-09-19 ENCOUNTER — Encounter: Payer: Self-pay | Admitting: Family Medicine

## 2017-09-19 NOTE — Progress Notes (Signed)
Patient aware and scheduled for repeat check on BP.

## 2017-09-20 NOTE — Telephone Encounter (Signed)
Can you call the patient and see what other symptoms he is having? It looks like he is coughing blood up and based on that I would recommend ED evaluation. Thanks.

## 2017-09-20 NOTE — Telephone Encounter (Signed)
Patient stated he coughs up bloody sputum after he coughs several times hard advised patient he needs to be evaluated he said he will go to ED when his wife gets home.

## 2017-09-25 ENCOUNTER — Ambulatory Visit: Payer: BLUE CROSS/BLUE SHIELD

## 2017-10-01 ENCOUNTER — Ambulatory Visit: Payer: BLUE CROSS/BLUE SHIELD

## 2017-10-02 ENCOUNTER — Ambulatory Visit: Payer: BLUE CROSS/BLUE SHIELD

## 2017-10-04 ENCOUNTER — Encounter: Payer: Self-pay | Admitting: *Deleted

## 2017-10-09 NOTE — Telephone Encounter (Signed)
Unread mychart message mailed to patient 

## 2017-10-15 ENCOUNTER — Telehealth: Payer: Self-pay | Admitting: Internal Medicine

## 2017-10-15 NOTE — Telephone Encounter (Signed)
Called and s/w Joe, the pharmacist at [facility]. They had a prescription for metoprolol and carvedilol. In looking through patient's record, metoprolol has been discontinued and the carvedilol is the appropriate prescription. Metoprolol removed from patient's list and pharmacy will remove it as well.

## 2017-10-15 NOTE — Telephone Encounter (Signed)
Pharmacist calling regarding duplication of carvedilol and metoprolol. Please call

## 2017-10-17 ENCOUNTER — Other Ambulatory Visit: Payer: Self-pay | Admitting: Family Medicine

## 2017-10-25 ENCOUNTER — Other Ambulatory Visit: Payer: Self-pay | Admitting: Family Medicine

## 2017-10-28 ENCOUNTER — Telehealth: Payer: Self-pay | Admitting: Family Medicine

## 2017-10-28 ENCOUNTER — Encounter: Payer: Self-pay | Admitting: Family Medicine

## 2017-10-28 ENCOUNTER — Ambulatory Visit: Payer: BLUE CROSS/BLUE SHIELD | Admitting: Family Medicine

## 2017-10-28 DIAGNOSIS — Z0289 Encounter for other administrative examinations: Secondary | ICD-10-CM

## 2017-10-28 NOTE — Telephone Encounter (Signed)
fyi

## 2017-10-28 NOTE — Telephone Encounter (Signed)
Noted. Thanks.

## 2017-10-28 NOTE — Telephone Encounter (Signed)
Today makes pt 2nd Now Showed appt and he has cancelled several times within the 24 hour time  period as well. No show letter was sent out

## 2017-12-13 ENCOUNTER — Other Ambulatory Visit: Payer: Self-pay | Admitting: Family Medicine

## 2017-12-18 ENCOUNTER — Other Ambulatory Visit: Payer: Self-pay | Admitting: Family Medicine

## 2017-12-23 ENCOUNTER — Encounter

## 2017-12-23 ENCOUNTER — Encounter: Payer: Self-pay | Admitting: Family Medicine

## 2017-12-23 ENCOUNTER — Ambulatory Visit (INDEPENDENT_AMBULATORY_CARE_PROVIDER_SITE_OTHER): Payer: 59 | Admitting: Family Medicine

## 2017-12-23 VITALS — BP 148/96 | HR 73 | Temp 98.8°F | Ht 67.0 in | Wt 226.2 lb

## 2017-12-23 DIAGNOSIS — R55 Syncope and collapse: Secondary | ICD-10-CM

## 2017-12-23 DIAGNOSIS — F322 Major depressive disorder, single episode, severe without psychotic features: Secondary | ICD-10-CM

## 2017-12-23 DIAGNOSIS — I10 Essential (primary) hypertension: Secondary | ICD-10-CM

## 2017-12-23 DIAGNOSIS — M2550 Pain in unspecified joint: Secondary | ICD-10-CM

## 2017-12-23 NOTE — Progress Notes (Signed)
[name], MD Phone: [phone]  Wesley Bridges is a 47 y.o. male who presents today for f/u.  CC: htn, syncope, depression  HYPERTENSION  Disease Monitoring  Home BP Monitoring some days are good and some elevated Chest pain- chronic intermittent sharp pain    Dyspnea- no Medications  Compliance-  Taking lisinopril, coreg.  Lightheadedness-  Yes on rising  Patient reports a little over a month ago he had another syncopal episode.  He was coming in from outside of the house when he told his wife he felt as though is going to blackout.  He had 2 vomiting episodes and then his head felt loopy and he could see stars and then his vision went black.  He never went all the way out and he could hear what his wife was saying.  He felt palpitations previous to this.  No chest pain.  No postictal phase.  Depression: Notes this is unchanged.  The Zoloft and the Wellbutrin do not seem to help.  He notes that sometimes he does wishes it though he could die but he has no intent or plan to harm himself.  He saw his psychologist and they advised him to tell us that the medications were not working.  He reports he is no longer seeing his orthopedist for his ankle pain.  They noted the next step would be complete fusion he did not want to proceed with this.  Social History   Tobacco Use  Smoking Status Never Smoker  Smokeless Tobacco Never Used     ROS see history of present illness  Objective  Physical Exam Vitals:   12/23/17 1532  BP: (!) 148/96  Pulse: 73  Temp: 98.8 F (37.1 C)  SpO2: 98%   Laying blood pressure 172/110 pulse 72 Sitting blood pressure 158/118 pulse 72 Standing blood pressure 160/110 pulse 81  BP Readings from Last 3 Encounters:  12/23/17 (!) 148/96  09/16/17 120/78  08/28/17 132/88   Wt Readings from Last 3 Encounters:  12/23/17 226 lb 3.2 oz (102.6 kg)  09/16/17 219 lb 6.4 oz (99.5 kg)  07/29/17 232 lb (105.2 kg)    Physical Exam    Constitutional: No distress.  Cardiovascular: Normal rate, regular rhythm and normal heart sounds.  No carotid bruits  Pulmonary/Chest: Effort normal and breath sounds normal.  Musculoskeletal: He exhibits no edema.  Neurological: He is alert.  Skin: Skin is warm and dry. He is not diaphoretic.   EKG: Normal sinus rhythm, rate 72, no ischemic changes, no arrhythmia  Assessment/Plan: Please see individual problem list.  Syncope Patient has had an additional episode of what sounds to be presyncope.  He reports symptoms of orthostasis intermittently though also reports palpitations.  Given his fairly significant reported orthostatic symptoms I would be hesitant to add additional blood pressure medication at this time and would like for him to see his cardiologist to consider further evaluation.  We will get him set up for this.  We will obtain lab work as well. He is given return precautions.   Major depression, single episode Patient continues to have issues with this.  We will alter his regimen.  He will decrease Zoloft to 50 mg daily for 2 weeks and Wellbutrin to 150 mg once daily for 2 weeks.  CMA will contact the patient with this regimen.  Once she contacts him I will send in Lexapro for him to start on at the end of the 2 weeks.  He is given return precautions.  Follow-up to be scheduled.  Joint pain Chronic issue in his ankle on the right side.  He has decided to hold off on any further treatment or evaluation.  Essential hypertension Given his fairly significant orthostatic symptoms we will have him return to cardiology for help in managing his blood pressure.   Orders Placed This Encounter  Procedures  . Comp Met (CMET)  . CBC  . TSH  . EKG 12-Lead    No orders of the defined types were placed in this encounter.    [name], MD [place]

## 2017-12-23 NOTE — Patient Instructions (Addendum)
Nice to see you. We are going to get you to see Dr. Saunders Revel again.  If you pass out please go to the ED for evaluation.  We will check lab work today and contact you with the results. I will confer with our clinical pharmacist regarding the best way to get you off of the Zoloft and Wellbutrin and to make the transition and we will contact you regarding that.

## 2017-12-24 ENCOUNTER — Encounter: Payer: Self-pay | Admitting: Family Medicine

## 2017-12-24 LAB — COMPREHENSIVE METABOLIC PANEL
ALK PHOS: 91 U/L (ref 39–117)
ALT: 21 U/L (ref 0–53)
AST: 16 U/L (ref 0–37)
Albumin: 4.1 g/dL (ref 3.5–5.2)
BILIRUBIN TOTAL: 0.3 mg/dL (ref 0.2–1.2)
BUN: 15 mg/dL (ref 6–23)
CALCIUM: 9.6 mg/dL (ref 8.4–10.5)
CO2: 28 mEq/L (ref 19–32)
Chloride: 104 mEq/L (ref 96–112)
Creatinine, Ser: 0.81 mg/dL (ref 0.40–1.50)
GFR: 107.93 mL/min (ref >60.00)
GLUCOSE: 90 mg/dL (ref 70–99)
Potassium: 3.8 mEq/L (ref 3.5–5.1)
Sodium: 139 mEq/L (ref 135–145)
TOTAL PROTEIN: 7.5 g/dL (ref 6.0–8.3)

## 2017-12-24 LAB — CBC
HCT: 43.9 % (ref 39.0–52.0)
HEMOGLOBIN: 14.9 g/dL (ref 13.0–17.0)
MCHC: 33.9 g/dL (ref 30.0–36.0)
MCV: 91.1 fl (ref 78.0–100.0)
PLATELETS: 299 10*3/uL (ref 150.0–400.0)
RBC: 4.82 Mil/uL (ref 4.22–5.81)
RDW: 14.3 % (ref 11.5–15.5)
WBC: 7.5 10*3/uL (ref 4.0–10.5)

## 2017-12-24 LAB — TSH: TSH: 3.57 u[IU]/mL (ref 0.35–4.50)

## 2017-12-24 MED ORDER — ATORVASTATIN CALCIUM 40 MG PO TABS: 40.0000 mg | ORAL_TABLET | Freq: Every day | ORAL | 1 refills | Status: DC

## 2017-12-24 MED ORDER — ESCITALOPRAM OXALATE 10 MG PO TABS: 10.0000 mg | ORAL_TABLET | Freq: Every day | ORAL | 1 refills | Status: DC

## 2017-12-24 NOTE — Assessment & Plan Note (Signed)
Given his fairly significant orthostatic symptoms we will have him return to cardiology for help in managing his blood pressure.

## 2017-12-24 NOTE — Assessment & Plan Note (Signed)
Chronic issue in his ankle on the right side.  He has decided to hold off on any further treatment or evaluation.

## 2017-12-24 NOTE — Assessment & Plan Note (Signed)
Patient continues to have issues with this.  We will alter his regimen.  He will decrease Zoloft to 50 mg daily for 2 weeks and Wellbutrin to 150 mg once daily for 2 weeks.  CMA will contact the patient with this regimen.  Once she contacts him I will send in Lexapro for him to start on at the end of the 2 weeks.  He is given return precautions.  Follow-up to be scheduled.

## 2017-12-24 NOTE — Progress Notes (Signed)
Patient notified and states he does not need a refill. Patient verbalized understanding.

## 2017-12-24 NOTE — Progress Notes (Signed)
Lexapro sent to pharmacy. 

## 2017-12-24 NOTE — Addendum Note (Signed)
Addended by: Caryl Bis Gal Feldhaus G on: 12/24/2017 01:56 PM   Modules accepted: Orders

## 2017-12-24 NOTE — Assessment & Plan Note (Addendum)
Patient has had an additional episode of what sounds to be presyncope.  He reports symptoms of orthostasis intermittently though also reports palpitations.  Given his fairly significant reported orthostatic symptoms I would be hesitant to add additional blood pressure medication at this time and would like for him to see his cardiologist to consider further evaluation.  We will get him set up for this.  We will obtain lab work as well. He is given return precautions.

## 2017-12-29 ENCOUNTER — Other Ambulatory Visit: Payer: Self-pay | Admitting: Family Medicine

## 2017-12-31 MED ORDER — PANTOPRAZOLE SODIUM 40 MG PO TBEC: 40.0000 mg | DELAYED_RELEASE_TABLET | Freq: Every day | ORAL | 0 refills | Status: DC

## 2018-01-23 ENCOUNTER — Other Ambulatory Visit
Admission: RE | Admit: 2018-01-23 | Discharge: 2018-01-23 | Disposition: A | Discharge status: Home / Self Care | Payer: 59 | Source: Ambulatory Visit | Attending: Internal Medicine | Admitting: Internal Medicine

## 2018-01-23 ENCOUNTER — Ambulatory Visit (INDEPENDENT_AMBULATORY_CARE_PROVIDER_SITE_OTHER): Payer: 59 | Admitting: Internal Medicine

## 2018-01-23 ENCOUNTER — Encounter: Payer: Self-pay | Admitting: Internal Medicine

## 2018-01-23 VITALS — BP 166/110 | HR 63 | Ht 67.0 in | Wt 218.8 lb

## 2018-01-23 DIAGNOSIS — I10 Essential (primary) hypertension: Secondary | ICD-10-CM

## 2018-01-23 DIAGNOSIS — R0989 Other specified symptoms and signs involving the circulatory and respiratory systems: Secondary | ICD-10-CM | POA: Diagnosis not present

## 2018-01-23 DIAGNOSIS — R55 Syncope and collapse: Secondary | ICD-10-CM

## 2018-01-23 DIAGNOSIS — R0789 Other chest pain: Secondary | ICD-10-CM | POA: Diagnosis not present

## 2018-01-23 MED ORDER — AMLODIPINE BESYLATE 5 MG PO TABS: 5.0000 mg | ORAL_TABLET | Freq: Every day | ORAL | 3 refills | Status: DC

## 2018-01-23 NOTE — Patient Instructions (Addendum)
Medication Instructions:  Your physician has recommended you make the following change in your medication:  1- START Amlodipine 5 mg by mouth once a day.   Labwork: Your physician recommends that you return for lab work in: TODAY (serum aldosterone, plasma renin activity, 24 hour urine for catecholamines and metanephrines). Please go to the [facility]. You will check in at the front desk to the right as you walk into the atrium. Valet Parking is offered if needed.   Testing/Procedures: Your physician has requested that you have a renal artery duplex. During this test, an ultrasound is used to evaluate blood flow to the kidneys. Allow one hour for this exam. Do not eat after midnight the day before and avoid carbonated beverages. Take your medications as you usually do.    Follow-Up: Your physician recommends that you schedule a follow-up appointment in: [place].   No DRIVING!   If you need a refill on your cardiac medications before your next appointment, please call your pharmacy.   24-Hour Urine Collection How do I do a 24-hour urine collection?  When you get up in the morning, urinate in the toilet and flush. Write down the time. This will be your start time on the day of collection and your end time on the next morning.  From then on, collect all of your urine in the plastic jug that is given to you.  Stop collecting your urine 24 hours after you started.  If the plastic jug that is given to you already has liquid in it, that is okay. Do not throw out the liquid or rinse out the jug. Some tests need the liquid to be added to your urine.  Keep your plastic jug cool in an ice chest or keep it in the refrigerator during the test.  When 24 hours are over, bring your plastic jug to the clinic lab. Keep the jug cool in an ice chest while you are bringing it to the lab. This information is not intended to replace advice given to you by your health care  provider. Make sure you discuss any questions you have with your health care provider. Document Released: 09/07/2008 Document Revised: 02/13/2016 Document Reviewed: 11/04/2013 Elsevier Interactive Patient Education  [employer].
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
## 2018-01-24 ENCOUNTER — Encounter: Payer: Self-pay | Admitting: Internal Medicine

## 2018-01-25 LAB — ALDOSTERONE + RENIN ACTIVITY W/ RATIO
ALDO / PRA RATIO: 5.4 (ref 0.0–30.0)
Aldosterone: 4.7 ng/dL (ref 0.0–30.0)
PRA LC/MS/MS: 0.877 ng/mL/h (ref 0.167–5.380)

## 2018-01-30 ENCOUNTER — Ambulatory Visit (INDEPENDENT_AMBULATORY_CARE_PROVIDER_SITE_OTHER): Payer: 59

## 2018-01-30 DIAGNOSIS — R0989 Other specified symptoms and signs involving the circulatory and respiratory systems: Secondary | ICD-10-CM | POA: Diagnosis not present

## 2018-01-31 ENCOUNTER — Other Ambulatory Visit
Admission: RE | Admit: 2018-01-31 | Discharge: 2018-01-31 | Disposition: A | Discharge status: Home / Self Care | Payer: 59 | Source: Ambulatory Visit | Attending: Internal Medicine | Admitting: Internal Medicine

## 2018-01-31 DIAGNOSIS — R0989 Other specified symptoms and signs involving the circulatory and respiratory systems: Secondary | ICD-10-CM | POA: Insufficient documentation

## 2018-02-04 LAB — METANEPHRINES, URINE, 24 HOUR
METANEPH TOTAL UR: 31 ug/L
Metanephrines, 24H Ur: 88 ug/24 hr (ref 45–290)
NORMETANEPHRINE 24H UR: 738 ug/(24.h) — AB (ref 82–500)
Normetanephrine, Ur: 259 ug/L
Total Volume: 2850

## 2018-02-06 ENCOUNTER — Other Ambulatory Visit: Payer: Self-pay | Admitting: *Deleted

## 2018-02-06 ENCOUNTER — Telehealth: Payer: Self-pay | Admitting: Internal Medicine

## 2018-02-06 ENCOUNTER — Other Ambulatory Visit
Admission: RE | Admit: 2018-02-06 | Discharge: 2018-02-06 | Disposition: A | Discharge status: Home / Self Care | Payer: 59 | Source: Ambulatory Visit | Attending: Internal Medicine | Admitting: Internal Medicine

## 2018-02-06 DIAGNOSIS — R002 Palpitations: Secondary | ICD-10-CM | POA: Diagnosis present

## 2018-02-06 DIAGNOSIS — I10 Essential (primary) hypertension: Secondary | ICD-10-CM | POA: Insufficient documentation

## 2018-02-06 DIAGNOSIS — R0789 Other chest pain: Secondary | ICD-10-CM

## 2018-02-06 NOTE — Telephone Encounter (Signed)
Recieved request from : [facility]  Forwarded to [employer] for processing

## 2018-02-08 LAB — CATECHOLAMINES,UR.,FREE,24 HR
DOPAMINE RANDOM UR: 250 ug/L
Dopamine, Ur, 24Hr: 713 ug/24 hr — ABNORMAL HIGH (ref 0–510)
EPINEPHRINE RAND UR: 3 ug/L
EPINEPHRINE, U, 24HR: 9 ug/(24.h) (ref 0–20)
NOREPINEPHRINE,U,24H: 100 ug/(24.h) (ref 0–135)
Norepinephrine, Rand Ur: 35 ug/L
Total Volume: 2850

## 2018-02-12 ENCOUNTER — Encounter: Payer: Self-pay | Admitting: Family Medicine

## 2018-02-12 ENCOUNTER — Other Ambulatory Visit: Payer: Self-pay | Admitting: Family Medicine

## 2018-02-12 MED ORDER — ATORVASTATIN CALCIUM 40 MG PO TABS: 40.0000 mg | ORAL_TABLET | Freq: Every day | ORAL | 1 refills | Status: DC

## 2018-02-12 MED ORDER — ESCITALOPRAM OXALATE 10 MG PO TABS: 10.0000 mg | ORAL_TABLET | Freq: Every day | ORAL | 1 refills | Status: DC

## 2018-03-04 ENCOUNTER — Ambulatory Visit (INDEPENDENT_AMBULATORY_CARE_PROVIDER_SITE_OTHER): Payer: 59 | Admitting: General Surgery

## 2018-03-04 ENCOUNTER — Encounter: Payer: Self-pay | Admitting: General Surgery

## 2018-03-04 VITALS — BP 130/70 | HR 70 | Resp 16 | Ht 67.0 in | Wt 223.0 lb

## 2018-03-04 DIAGNOSIS — R1909 Other intra-abdominal and pelvic swelling, mass and lump: Secondary | ICD-10-CM | POA: Diagnosis not present
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
## 2018-03-04 NOTE — Patient Instructions (Signed)
Follow up appointment to be announced.  

## 2018-03-05 ENCOUNTER — Encounter: Payer: Self-pay | Admitting: Internal Medicine

## 2018-03-05 ENCOUNTER — Ambulatory Visit (INDEPENDENT_AMBULATORY_CARE_PROVIDER_SITE_OTHER): Payer: 59 | Admitting: Internal Medicine

## 2018-03-05 VITALS — BP 128/78 | HR 62 | Ht 67.0 in | Wt 225.5 lb

## 2018-03-05 DIAGNOSIS — R55 Syncope and collapse: Secondary | ICD-10-CM | POA: Diagnosis not present

## 2018-03-05 DIAGNOSIS — R200 Anesthesia of skin: Secondary | ICD-10-CM | POA: Insufficient documentation

## 2018-03-05 DIAGNOSIS — R519 Headache, unspecified: Secondary | ICD-10-CM

## 2018-03-05 DIAGNOSIS — I10 Essential (primary) hypertension: Secondary | ICD-10-CM

## 2018-03-05 DIAGNOSIS — R202 Paresthesia of skin: Secondary | ICD-10-CM

## 2018-03-05 DIAGNOSIS — R0789 Other chest pain: Secondary | ICD-10-CM | POA: Diagnosis not present

## 2018-03-05 DIAGNOSIS — R51 Headache: Secondary | ICD-10-CM

## 2018-03-05 DIAGNOSIS — E785 Hyperlipidemia, unspecified: Secondary | ICD-10-CM

## 2018-03-05 NOTE — Patient Instructions (Addendum)
Medication Instructions:  Your physician has recommended you make the following change in your medication:  1- STOP Aspirin.   Labwork: none  Testing/Procedures: none  Follow-Up: You have been referred to Neurology.  [facility] Neurology April 08, 2018 at 09:00 am. Please arrive 15 minutes early. You will be seeing Dr Gurney Maxin.   Your physician recommends that you schedule a follow-up appointment in: [place].  If you need a refill on your cardiac medications before your next appointment, please call your pharmacy.
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
## 2018-03-12 ENCOUNTER — Telehealth: Payer: Self-pay | Admitting: Internal Medicine

## 2018-03-12 NOTE — Telephone Encounter (Signed)
Received request from [place] in interoffice mail and sent to [employer]

## 2018-03-14 ENCOUNTER — Other Ambulatory Visit: Payer: Self-pay | Admitting: Family Medicine

## 2018-03-20 ENCOUNTER — Telehealth: Payer: Self-pay | Admitting: General Surgery

## 2018-03-20 ENCOUNTER — Telehealth: Payer: Self-pay

## 2018-03-20 NOTE — Telephone Encounter (Signed)
Fax from [facility]   DUK:[id number]  Patient last name: Moya-saboria DOB: [birth year]

## 2018-03-20 NOTE — Telephone Encounter (Signed)
Patient called today & was checking to see if Dr Bary Castilla had spoken to Dr Perrin Maltese to have him review the images of patient's bilateral inguinal herniabilateral inguinal hernia. Please advise. Please call patient at [phone].

## 2018-03-23 NOTE — Telephone Encounter (Signed)
I had Dr. Dahlia Byes review the CT and my office notes prior to September 19th, and was under the impression that he would arrange for the patient to be come in for his (Dr. Corlis Leak) evaluation re: laparoscopic/ robotic hernia repair.  Will ask the staff to arrange an OV w/ Dr. Dahlia Byes.

## 2018-03-24 NOTE — Telephone Encounter (Signed)
Sent to [facility] for PA. If you have time today I will need you to help assist me. Thanks

## 2018-03-24 NOTE — Telephone Encounter (Signed)
Is medication being prescribed by Dr. Caryl Bis discontinued 07/18/17 due to insurance denial from cardiology . I am glad to try but DR. Caryl Bis would have to prescribe.

## 2018-03-25 NOTE — Telephone Encounter (Signed)
Called pt and left a VM to call back. CRM created and sent to [facility] pool. 

## 2018-03-26 NOTE — Telephone Encounter (Signed)
Called and spoke with pt. Pt stated that Dr. Caryl Bis would be the one filling this medication.   Pt stated that the cardiologist had to stop the Bystoloc bc his new insurance he had would not cover but this medication. Pt stated that Bystolic does work better for him pt would like the PA to be done by Dr. Caryl Bis.   Sent to Juliann Pulse will you help assist me with this thanks.

## 2018-03-27 NOTE — Telephone Encounter (Signed)
Sent to Katie

## 2018-03-27 NOTE — Telephone Encounter (Signed)
Sent to PCP to send in RX for Bystolic

## 2018-03-27 NOTE — Telephone Encounter (Signed)
I will forward to Catie to determine if there is an equivalent dose when switching from coreg to bystolic. It appears he was previously on bystolic 10 mg daily. Once I hear back from her I will place the order. Thanks.

## 2018-03-27 NOTE — Telephone Encounter (Signed)
It looks like he was last on Bystolic (nebivolol) 10 mg daily, which is roughly equivalent to carvedilol 12.5 mg BID. I'd start there!  Catie Darnelle Maffucci, PharmD PGY2 Ambulatory Care Pharmacy Resident, [facility] Phone: [phone]

## 2018-03-27 NOTE — Telephone Encounter (Signed)
Sent to PCP ?

## 2018-03-27 NOTE — Telephone Encounter (Signed)
PCP will need  to prescribe, need dosage and for insurance to deny , medication.

## 2018-03-28 MED ORDER — NEBIVOLOL HCL 10 MG PO TABS: 10.0000 mg | ORAL_TABLET | Freq: Every day | ORAL | 1 refills | Status: DC

## 2018-03-28 NOTE — Telephone Encounter (Signed)
Sent to pharmacy.  I will forward to Juliann Pulse to await the prior authorization.

## 2018-03-28 NOTE — Addendum Note (Signed)
Addended by: Leone Haven on: 03/28/2018 05:24 PM   Modules accepted: Orders

## 2018-03-31 NOTE — Telephone Encounter (Signed)
Called and left message for patient to return call to office, insurance card on file is being rejected by Cover My Meds No patient by this name.

## 2018-04-02 ENCOUNTER — Ambulatory Visit (INDEPENDENT_AMBULATORY_CARE_PROVIDER_SITE_OTHER): Payer: 59

## 2018-04-02 ENCOUNTER — Encounter: Payer: Self-pay | Admitting: Surgery

## 2018-04-02 ENCOUNTER — Ambulatory Visit (INDEPENDENT_AMBULATORY_CARE_PROVIDER_SITE_OTHER): Payer: 59 | Admitting: Family Medicine

## 2018-04-02 ENCOUNTER — Ambulatory Visit (INDEPENDENT_AMBULATORY_CARE_PROVIDER_SITE_OTHER): Payer: 59 | Admitting: Surgery

## 2018-04-02 ENCOUNTER — Encounter: Payer: Self-pay | Admitting: Family Medicine

## 2018-04-02 VITALS — BP 146/88 | HR 69 | Temp 98.5°F | Ht 67.0 in | Wt 227.6 lb

## 2018-04-02 VITALS — BP 162/103 | HR 84 | Temp 97.9°F | Ht 67.0 in | Wt 227.2 lb

## 2018-04-02 DIAGNOSIS — Z23 Encounter for immunization: Secondary | ICD-10-CM

## 2018-04-02 DIAGNOSIS — R825 Elevated urine levels of drugs, medicaments and biological substances: Secondary | ICD-10-CM

## 2018-04-02 DIAGNOSIS — M25521 Pain in right elbow: Secondary | ICD-10-CM | POA: Diagnosis not present

## 2018-04-02 DIAGNOSIS — K4020 Bilateral inguinal hernia, without obstruction or gangrene, not specified as recurrent: Secondary | ICD-10-CM

## 2018-04-02 DIAGNOSIS — F321 Major depressive disorder, single episode, moderate: Secondary | ICD-10-CM

## 2018-04-02 DIAGNOSIS — I10 Essential (primary) hypertension: Secondary | ICD-10-CM

## 2018-04-02 DIAGNOSIS — E785 Hyperlipidemia, unspecified: Secondary | ICD-10-CM

## 2018-04-02 DIAGNOSIS — F322 Major depressive disorder, single episode, severe without psychotic features: Secondary | ICD-10-CM

## 2018-04-02 DIAGNOSIS — M25421 Effusion, right elbow: Secondary | ICD-10-CM | POA: Diagnosis not present

## 2018-04-02 LAB — LIPID PANEL
CHOL/HDL RATIO: 4
Cholesterol: 178 mg/dL (ref 0–200)
HDL: 50.2 mg/dL (ref >39.00)
LDL CALC: 112 mg/dL — AB (ref 0–99)
NONHDL: 127.98
Triglycerides: 78 mg/dL (ref 0.0–149.0)
VLDL: 15.6 mg/dL (ref 0.0–40.0)

## 2018-04-02 MED ORDER — ESCITALOPRAM OXALATE 20 MG PO TABS: 20.0000 mg | ORAL_TABLET | Freq: Every day | ORAL | 1 refills | Status: DC

## 2018-04-02 NOTE — Progress Notes (Signed)
Patient ID: Wesley Bridges, male   DOB: 13-Oct-1970, 47 y.o.   MRN: [id number]  HPI Wesley Bridges is a 47 y.o. male seen in consultation at the request of Dr. Bary Castilla fo Bilateral Inguinal hernias.  Reports that he has been having some issues intermittently on his right groin for a couple of years.  They hernias were found incidentally on a CT scan.  I have personally reviewed the CT scan there is evidence of bilateral inguinal hernias no evidence of obstruction no evidence of incarceration. Ports intermittent sharp pain that is moderate in intensity on the right inguinal area that worsen with Valsalva.  There is no other specific alleviating factors. He ambulates with a cane after a significant right ankle fracture several years ago. Has recently seen his cardiologist for some palpitations and so far the work-up has been negative.  The cardiac cath showed no evidence of any significant stenotic lesions. No previous inguinal operations CMP and CBC are nml  HPI  Past Medical History:  Diagnosis Date  . Asthma   . Chronic headaches   . GERD (gastroesophageal reflux disease)   . Hyperlipidemia   . Hypertension   . Subclinical hyperthyroidism     Past Surgical History:  Procedure Laterality Date  . ANKLE FRACTURE SURGERY    . ANKLE FUSION     [facility]  . FEMUR FRACTURE SURGERY    . LEFT HEART CATH AND CORONARY ANGIOGRAPHY N/A 01/08/2017   Procedure: Left Heart Cath and Coronary Angiography;  Surgeon: Nelva Bush, MD;  Location: [facility];  Service: Cardiovascular;  Laterality: N/A;  . MANDIBLE FRACTURE SURGERY      Family History  Problem Relation Age of Onset  . Arthritis Unknown        parents  . Hypertension Mother   . Thyroid disease Mother   . Osteoporosis Mother   . Syncope episode Mother   . Hypertension Brother     Social History Social History   Tobacco Use  . Smoking status: Never Smoker  . Smokeless tobacco: Never Used  Substance Use  Topics  . Alcohol use: Yes    Alcohol/week: 14.0 standard drinks    Types: 14 Cans of beer per week  . Drug use: No    Allergies  Allergen Reactions  . Pollen Extract     Current Outpatient Medications  Medication Sig Dispense Refill  . acetaminophen (TYLENOL) 500 MG tablet Take 1,000 mg by mouth every 6 (six) hours as needed for headache.     Marland Kitchen amLODipine (NORVASC) 5 MG tablet Take 1 tablet (5 mg total) by mouth daily. 90 tablet 3  . atorvastatin (LIPITOR) 40 MG tablet Take 1 tablet (40 mg total) by mouth daily. 90 tablet 1  . carvedilol (COREG) 6.25 MG tablet TK 1 T PO BID  1  . escitalopram (LEXAPRO) 20 MG tablet Take 1 tablet (20 mg total) by mouth daily. 90 tablet 1  . fluticasone (FLONASE) 50 MCG/ACT nasal spray U 2 SPRAYS IEN ONCE D  11  . ibuprofen (ADVIL,MOTRIN) 800 MG tablet as needed.   1  . lisinopril (PRINIVIL,ZESTRIL) 40 MG tablet Take 1 tablet (40 mg total) by mouth daily. 90 tablet 3  . loratadine (CLARITIN) 10 MG tablet Take 1 tablet (10 mg total) by mouth daily. (Patient taking differently: Take 20 mg by mouth daily. ) 30 tablet 11  . Multiple Vitamin (MULTIVITAMIN WITH MINERALS) TABS tablet Take 1 tablet by mouth at bedtime.    Marland Kitchen  nebivolol (BYSTOLIC) 10 MG tablet Take 1 tablet (10 mg total) by mouth daily. 90 tablet 1  . nitroGLYCERIN (NITROSTAT) 0.4 MG SL tablet DISSOLVE 1 TABLET UNDER THE TONGUE EVERY 5 MINUTES AS NEEDED FOR CHEST PAIN. MAXIMUM DAILY DOSE IS 3 TABLETS 25 tablet 0  . pantoprazole (PROTONIX) 40 MG tablet TAKE 1 TABLET(40 MG) BY MOUTH AT BEDTIME 90 tablet 0   No current facility-administered medications for this visit.      Review of Systems Full ROS  was asked and was negative except for the information on the HPI  Physical Exam Blood pressure (!) 162/103, pulse 84, temperature 97.9 F (36.6 C), temperature source Temporal, height 5\' 7"  (1.702 m), weight 227 lb 3.2 oz (103.1 kg). CONSTITUTIONAL: NAD EYES: Pupils are equal, round, and reactive  to light, Sclera are non-icteric. EARS, NOSE, MOUTH AND THROAT: The oropharynx is clear. The oral mucosa is pink and moist. Hearing is intact to voice. LYMPH NODES:  Lymph nodes in the neck are normal. RESPIRATORY:  Lungs are clear. There is normal respiratory effort, with equal breath sounds bilaterally, and without pathologic use of accessory muscles. CARDIOVASCULAR: Heart is regular without murmurs, gallops, or rubs. GI: The abdomen is  soft, nontender, and nondistended. There are no palpable masses. There is no hepatosplenomegaly. There are normal bowel sounds in all quadrants. Inguinal area with small RIH , tender to palpation, left side I can not feel a defect GU: Rectal deferred.   MUSCULOSKELETAL: Normal muscle strength and tone. No cyanosis or edema.   SKIN: Turgor is good and there are no pathologic skin lesions or ulcers. NEUROLOGIC: Motor and sensation is grossly normal. Cranial nerves are grossly intact. PSYCH:  Oriented to person, place and time. Affect is normal.  Data Reviewed  I have personally reviewed the patient's imaging, laboratory findings and medical records.    Assessment/Plan  47 year old male with symptomatic bilateral inguinal hernias.  Gust with patient in detail about his disease process.  I do recommend repair and I do think that he will be a great candidate for robotically assisted laparoscopic bilateral inguinal hernia repair.  Procedure discussed with the patient detail.  Risk benefit and possible complications including but not limited to: Bleeding, infection, recurrence, chronic pain.  He understands and wishes to proceed.  We will schedule him for elective repair in the next few weeks.    Caroleen Hamman, MD FACS General Surgeon 04/02/2018, 3:32 PM

## 2018-04-02 NOTE — H&P (View-Only) (Signed)
Patient ID: Wesley Bridges, male   DOB: Aug 18, 1970, 47 y.o.   MRN: [id number]  HPI Wesley Bridges is a 47 y.o. male seen in consultation at the request of Dr. Bary Castilla fo Bilateral Inguinal hernias.  Reports that he has been having some issues intermittently on his right groin for a couple of years.  They hernias were found incidentally on a CT scan.  I have personally reviewed the CT scan there is evidence of bilateral inguinal hernias no evidence of obstruction no evidence of incarceration. Ports intermittent sharp pain that is moderate in intensity on the right inguinal area that worsen with Valsalva.  There is no other specific alleviating factors. He ambulates with a cane after a significant right ankle fracture several years ago. Has recently seen his cardiologist for some palpitations and so far the work-up has been negative.  The cardiac cath showed no evidence of any significant stenotic lesions. No previous inguinal operations CMP and CBC are nml  HPI  Past Medical History:  Diagnosis Date  . Asthma   . Chronic headaches   . GERD (gastroesophageal reflux disease)   . Hyperlipidemia   . Hypertension   . Subclinical hyperthyroidism     Past Surgical History:  Procedure Laterality Date  . ANKLE FRACTURE SURGERY    . ANKLE FUSION     [facility]  . FEMUR FRACTURE SURGERY    . LEFT HEART CATH AND CORONARY ANGIOGRAPHY N/A 01/08/2017   Procedure: Left Heart Cath and Coronary Angiography;  Surgeon: Nelva Bush, MD;  Location: [facility];  Service: Cardiovascular;  Laterality: N/A;  . MANDIBLE FRACTURE SURGERY      Family History  Problem Relation Age of Onset  . Arthritis Unknown        parents  . Hypertension Mother   . Thyroid disease Mother   . Osteoporosis Mother   . Syncope episode Mother   . Hypertension Brother     Social History Social History   Tobacco Use  . Smoking status: Never Smoker  . Smokeless tobacco: Never Used  Substance Use  Topics  . Alcohol use: Yes    Alcohol/week: 14.0 standard drinks    Types: 14 Cans of beer per week  . Drug use: No    Allergies  Allergen Reactions  . Pollen Extract     Current Outpatient Medications  Medication Sig Dispense Refill  . acetaminophen (TYLENOL) 500 MG tablet Take 1,000 mg by mouth every 6 (six) hours as needed for headache.     Marland Kitchen amLODipine (NORVASC) 5 MG tablet Take 1 tablet (5 mg total) by mouth daily. 90 tablet 3  . atorvastatin (LIPITOR) 40 MG tablet Take 1 tablet (40 mg total) by mouth daily. 90 tablet 1  . carvedilol (COREG) 6.25 MG tablet TK 1 T PO BID  1  . escitalopram (LEXAPRO) 20 MG tablet Take 1 tablet (20 mg total) by mouth daily. 90 tablet 1  . fluticasone (FLONASE) 50 MCG/ACT nasal spray U 2 SPRAYS IEN ONCE D  11  . ibuprofen (ADVIL,MOTRIN) 800 MG tablet as needed.   1  . lisinopril (PRINIVIL,ZESTRIL) 40 MG tablet Take 1 tablet (40 mg total) by mouth daily. 90 tablet 3  . loratadine (CLARITIN) 10 MG tablet Take 1 tablet (10 mg total) by mouth daily. (Patient taking differently: Take 20 mg by mouth daily. ) 30 tablet 11  . Multiple Vitamin (MULTIVITAMIN WITH MINERALS) TABS tablet Take 1 tablet by mouth at bedtime.    Marland Kitchen  nebivolol (BYSTOLIC) 10 MG tablet Take 1 tablet (10 mg total) by mouth daily. 90 tablet 1  . nitroGLYCERIN (NITROSTAT) 0.4 MG SL tablet DISSOLVE 1 TABLET UNDER THE TONGUE EVERY 5 MINUTES AS NEEDED FOR CHEST PAIN. MAXIMUM DAILY DOSE IS 3 TABLETS 25 tablet 0  . pantoprazole (PROTONIX) 40 MG tablet TAKE 1 TABLET(40 MG) BY MOUTH AT BEDTIME 90 tablet 0   No current facility-administered medications for this visit.      Review of Systems Full ROS  was asked and was negative except for the information on the HPI  Physical Exam Blood pressure (!) 162/103, pulse 84, temperature 97.9 F (36.6 C), temperature source Temporal, height 5\' 7"  (1.702 m), weight 227 lb 3.2 oz (103.1 kg). CONSTITUTIONAL: NAD EYES: Pupils are equal, round, and reactive  to light, Sclera are non-icteric. EARS, NOSE, MOUTH AND THROAT: The oropharynx is clear. The oral mucosa is pink and moist. Hearing is intact to voice. LYMPH NODES:  Lymph nodes in the neck are normal. RESPIRATORY:  Lungs are clear. There is normal respiratory effort, with equal breath sounds bilaterally, and without pathologic use of accessory muscles. CARDIOVASCULAR: Heart is regular without murmurs, gallops, or rubs. GI: The abdomen is  soft, nontender, and nondistended. There are no palpable masses. There is no hepatosplenomegaly. There are normal bowel sounds in all quadrants. Inguinal area with small RIH , tender to palpation, left side I can not feel a defect GU: Rectal deferred.   MUSCULOSKELETAL: Normal muscle strength and tone. No cyanosis or edema.   SKIN: Turgor is good and there are no pathologic skin lesions or ulcers. NEUROLOGIC: Motor and sensation is grossly normal. Cranial nerves are grossly intact. PSYCH:  Oriented to person, place and time. Affect is normal.  Data Reviewed  I have personally reviewed the patient's imaging, laboratory findings and medical records.    Assessment/Plan  47 year old male with symptomatic bilateral inguinal hernias.  Gust with patient in detail about his disease process.  I do recommend repair and I do think that he will be a great candidate for robotically assisted laparoscopic bilateral inguinal hernia repair.  Procedure discussed with the patient detail.  Risk benefit and possible complications including but not limited to: Bleeding, infection, recurrence, chronic pain.  He understands and wishes to proceed.  We will schedule him for elective repair in the next few weeks.    Caroleen Hamman, MD FACS General Surgeon 04/02/2018, 3:32 PM

## 2018-04-02 NOTE — Patient Instructions (Addendum)
We will request Cardiac Clearance from Dr.Christopher End. You have chose to have your hernia repaired. This will be done by Dr. Caroleen Hamman  at [facility].   You will need to arrange to be out of work for 2 weeks and then return with a lifting restrictions for 4 more weeks. Please send any FMLA paperwork prior to surgery and we will fill this out and fax it back to your employer within 3 business days.  You may have a bruise in your groin and also swelling and brusing in your testicle area. You may use ice 4-5 times daily for 15-20 minutes each time. Make sure that you place a barrier between you and the ice pack. To decrease the swelling, you may roll up a bath towel and place it vertically in between your thighs with your testicles resting on the towel. You will want to keep this area elevated as much as possible for several days following surgery.    Inguinal Hernia, Adult Muscles help keep everything in the body in its proper place. But if a weak spot in the muscles develops, something can poke through. That is called a hernia. When this happens in the lower part of the belly (abdomen), it is called an inguinal hernia. (It takes its name from a part of the body in this region called the inguinal canal.) A weak spot in the wall of muscles lets some fat or part of the small intestine bulge through. An inguinal hernia can develop at any age. Men get them more often than women. CAUSES  In adults, an inguinal hernia develops over time.  It can be triggered by:  Suddenly straining the muscles of the lower abdomen.  Lifting heavy objects.  Straining to have a bowel movement. Difficult bowel movements (constipation) can lead to this.  Constant coughing. This may be caused by smoking or lung disease.  Being overweight.  Being pregnant.  Working at a job that requires long periods of standing or heavy lifting.  Having had an inguinal hernia before. One type can be an emergency situation. It is  called a strangulated inguinal hernia. It develops if part of the small intestine slips through the weak spot and cannot get back into the abdomen. The blood supply can be cut off. If that happens, part of the intestine may die. This situation requires emergency surgery. SYMPTOMS  Often, a small inguinal hernia has no symptoms. It is found when a healthcare provider does a physical exam. Larger hernias usually have symptoms.   In adults, symptoms may include:  A lump in the groin. This is easier to see when the person is standing. It might disappear when lying down.  In men, a lump in the scrotum.  Pain or burning in the groin. This occurs especially when lifting, straining or coughing.  A dull ache or feeling of pressure in the groin.  Signs of a strangulated hernia can include:  A bulge in the groin that becomes very painful and tender to the touch.  A bulge that turns red or purple.  Fever, nausea and vomiting.  Inability to have a bowel movement or to pass gas. DIAGNOSIS  To decide if you have an inguinal hernia, a healthcare provider will probably do a physical examination.  This will include asking questions about any symptoms you have noticed.  The healthcare provider might feel the groin area and ask you to cough. If an inguinal hernia is felt, the healthcare provider may try to slide  it back into the abdomen.  Usually no other tests are needed. TREATMENT  Treatments can vary. The size of the hernia makes a difference. Options include:  Watchful waiting. This is often suggested if the hernia is small and you have had no symptoms.  No medical procedure will be done unless symptoms develop.  You will need to watch closely for symptoms. If any occur, contact your healthcare provider right away.  Surgery. This is used if the hernia is larger or you have symptoms.  Open surgery. This is usually an outpatient procedure (you will not stay overnight in a hospital). An cut  (incision) is made through the skin in the groin. The hernia is put back inside the abdomen. The weak area in the muscles is then repaired by herniorrhaphy or hernioplasty. Herniorrhaphy: in this type of surgery, the weak muscles are sewn back together. Hernioplasty: a patch or mesh is used to close the weak area in the abdominal wall.  Laparoscopy. In this procedure, a surgeon makes small incisions. A thin tube with a tiny video camera (called a laparoscope) is put into the abdomen. The surgeon repairs the hernia with mesh by looking with the video camera and using two long instruments. HOME CARE INSTRUCTIONS   After surgery to repair an inguinal hernia:  You will need to take pain medicine prescribed by your healthcare provider. Follow all directions carefully.  You will need to take care of the wound from the incision.  Your activity will be restricted for awhile. This will probably include no heavy lifting for several weeks. You also should not do anything too active for a few weeks. When you can return to work will depend on the type of job that you have.  During "watchful waiting" periods, you should:  Maintain a healthy weight.  Eat a diet high in fiber (fruits, vegetables and whole grains).  Drink plenty of fluids to avoid constipation. This means drinking enough water and other liquids to keep your urine clear or pale yellow.  Do not lift heavy objects.  Do not stand for long periods of time.  Quit smoking. This should keep you from developing a frequent cough. SEEK MEDICAL CARE IF:   A bulge develops in your groin area.  You feel pain, a burning sensation or pressure in the groin. This might be worse if you are lifting or straining.  You develop a fever of more than 100.5 F (38.1 C). SEEK IMMEDIATE MEDICAL CARE IF:   Pain in the groin increases suddenly.  A bulge in the groin gets bigger suddenly and does not go down.  For men, there is sudden pain in the scrotum.  Or, the size of the scrotum increases.  A bulge in the groin area becomes red or purple and is painful to touch.  You have nausea or vomiting that does not go away.  You feel your heart beating much faster than normal.  You cannot have a bowel movement or pass gas.  You develop a fever of more than 102.0 F (38.9 C).   This information is not intended to replace advice given to you by your health care provider. Make sure you discuss any questions you have with your health care provider.   Document Released: 10/28/2008 Document Revised: 09/03/2011 Document Reviewed: 12/13/2014 Elsevier Interactive Patient Education [employer].

## 2018-04-02 NOTE — Patient Instructions (Signed)
Nice to see you. We are going to get you to see endocrinology and orthopedics.  We will get an x-ray today. We will increase your Lexapro. We will get a lipid panel. Please monitor your blood pressure.

## 2018-04-03 ENCOUNTER — Encounter: Payer: Self-pay | Admitting: *Deleted

## 2018-04-03 ENCOUNTER — Telehealth: Payer: Self-pay | Admitting: Internal Medicine

## 2018-04-03 DIAGNOSIS — M25521 Pain in right elbow: Secondary | ICD-10-CM | POA: Insufficient documentation

## 2018-04-03 DIAGNOSIS — R825 Elevated urine levels of drugs, medicaments and biological substances: Secondary | ICD-10-CM | POA: Insufficient documentation

## 2018-04-03 NOTE — Assessment & Plan Note (Signed)
Patient continues to have issues with this.  We will increase his Lexapro.  We will refer to psychiatry.  He is given return precautions.

## 2018-04-03 NOTE — Assessment & Plan Note (Signed)
Patient is to see his surgeon today to consider repair.  I think it would be necessary for him to be seen by endocrinology regarding his abnormal labs prior to surgical intervention.  I will send this note to his surgeon.

## 2018-04-03 NOTE — Telephone Encounter (Signed)
Routing to Dr End for clearance.  

## 2018-04-03 NOTE — Telephone Encounter (Signed)
° °  [facility] Pre-operative Risk Assessment    Request for surgical clearance:  1. What type of surgery is being performed? Robotic Bilateral Inguinal Hernia Repair    2. When is this surgery scheduled?   3. What type of clearance is required (medical clearance vs. Pharmacy clearance to hold med vs. Both)?   4. Are there any medications that need to be held prior to surgery and how long?  5. Practice name and name of physician performing surgery? [facility] Dr Dahlia Byes   6. What is your office phone number [phone]   7.   What is your office fax number [phone]  8.   Anesthesia type (None, local, MAC, general) ?

## 2018-04-03 NOTE — Assessment & Plan Note (Signed)
Slightly above goal.  Given his abnormal urine studies we will refer to endocrinology.  These did not necessarily meet criteria based on review of up-to-date for further evaluation though I think it would be prudent to have him evaluated by endocrinology to determine if any further work-up is needed.

## 2018-04-03 NOTE — Progress Notes (Signed)
Cardiac clearance request faxed to Dr. Darnelle Bos office [facility]; Fax: [phone]) per [id number] request.

## 2018-04-03 NOTE — Assessment & Plan Note (Signed)
Undetermined cause.  Will obtain an x-ray given decreased range of motion.  Will refer to orthopedics.

## 2018-04-03 NOTE — Assessment & Plan Note (Signed)
Nonspecific elevation.  Based on up-to-date it does not meet criteria to consider further evaluation though I do think he needs to see endocrinology to discuss this.

## 2018-04-03 NOTE — Progress Notes (Signed)
[name], MD Phone: [phone]  Wesley Bridges is a 47 y.o. male who presents today for f/u.  CC: depression, hld, right elbow pain, hernia  Depression: Patient notes this is still present and unchanged.  He does have thoughts of being better off not being here though he has no thoughts of killing himself or plan or intent to kill himself.  He notes no anxiety.  He has been a little more jumpy.  The 10 mg of Lexapro was not helpful.  He has not seen psychiatry.  Hyperlipidemia: Taking Lipitor.  No chest pressure.  No exertional chest pain.  Right elbow pain: Notes this has been hurting for several months.  Is not able to extend it all the way.  No injury.  Notes it will lock up at times.  Hypertension: He notes his blood pressure goes up and down.  Sometimes it will be well controlled and other times it will be elevated.  He was evaluated for secondary causes by cardiology and his urine metanephrines and catecholamines had one abnormal value each though they do not appear to have been significantly elevated.  It looks like they discussed endocrinology referral though decided to hold off on this.  Inguinal hernias: He is seeing a surgeon later today to discuss repair.  Social History   Tobacco Use  Smoking Status Never Smoker  Smokeless Tobacco Never Used     ROS see history of present illness  Objective  Physical Exam Vitals:   04/02/18 1131  BP: (!) 146/88  Pulse: 69  Temp: 98.5 F (36.9 C)  SpO2: 98%    BP Readings from Last 3 Encounters:  04/02/18 (!) 162/103  04/02/18 (!) 146/88  03/05/18 128/78   Wt Readings from Last 3 Encounters:  04/02/18 227 lb 3.2 oz (103.1 kg)  04/02/18 227 lb 9.6 oz (103.2 kg)  03/05/18 225 lb 8 oz (102.3 kg)    Physical Exam  Constitutional: No distress.  Cardiovascular: Normal rate, regular rhythm and normal heart sounds.  Pulmonary/Chest: Effort normal and breath sounds normal.  Musculoskeletal: He exhibits no  edema.  Right elbow with tenderness over the lateral epicondyles, possible slight swelling, no warmth, no erythema, difficulty fully extending, left elbow with no tenderness, warmth, erythema, or range of motion issues, 5/5 strength bilateral biceps, triceps, handgrip, sensation light touch intact bilateral upper extremities  Neurological: He is alert.  Skin: Skin is warm and dry. He is not diaphoretic.     Assessment/Plan: Please see individual problem list.  Essential hypertension Slightly above goal.  Given his abnormal urine studies we will refer to endocrinology.  These did not necessarily meet criteria based on review of up-to-date for further evaluation though I think it would be prudent to have him evaluated by endocrinology to determine if any further work-up is needed.  Hyperlipidemia Continue Lipitor.  Check lipid panel.  Inguinal hernia Patient is to see his surgeon today to consider repair.  I think it would be necessary for him to be seen by endocrinology regarding his abnormal labs prior to surgical intervention.  I will send this note to his surgeon.  Major depression, single episode Patient continues to have issues with this.  We will increase his Lexapro.  We will refer to psychiatry.  He is given return precautions.  Elevated urine levels of catecholamines Nonspecific elevation.  Based on up-to-date it does not meet criteria to consider further evaluation though I do think he needs to see endocrinology to discuss this.  Right  elbow pain Undetermined cause.  Will obtain an x-ray given decreased range of motion.  Will refer to orthopedics.   Orders Placed This Encounter  Procedures  . DG Elbow Complete Right    Standing Status:   Future    Number of Occurrences:   1    Standing Expiration Date:   06/03/2019    Order Specific Question:   Reason for Exam (SYMPTOM  OR DIAGNOSIS REQUIRED)    Answer:   right elbow, decreased extension ROM, no injury    Order Specific  Question:   Preferred imaging location?    Answer:   [facility] Specific Question:   Radiology Contrast Protocol - do NOT remove file path    Answer:   \\[email or web address]s.pdf  . Flu Vaccine QUAD 6+ mos PF IM (Fluarix Quad PF)  . Tdap vaccine greater than or equal to 7yo IM  . Lipid panel  . Ambulatory referral to Orthopedic Surgery    Referral Priority:   Routine    Referral Type:   Surgical    Referral Reason:   Specialty Services Required    Requested Specialty:   Orthopedic Surgery    Number of Visits Requested:   1  . Ambulatory referral to Psychiatry    Referral Priority:   Routine    Referral Type:   Psychiatric    Referral Reason:   Specialty Services Required    Requested Specialty:   Psychiatry    Number of Visits Requested:   1  . Ambulatory referral to Endocrinology    Referral Priority:   Routine    Referral Type:   Consultation    Referral Reason:   Specialty Services Required    Number of Visits Requested:   1    Meds ordered this encounter  Medications  . escitalopram (LEXAPRO) 20 MG tablet    Sig: Take 1 tablet (20 mg total) by mouth daily.    Dispense:  90 tablet    Refill:  1     [name], MD [place]

## 2018-04-03 NOTE — Assessment & Plan Note (Signed)
-

## 2018-04-03 NOTE — Telephone Encounter (Signed)
Patient with stable symptoms at last visit a month ago.  I think it is reasonable for him to proceed with hernia repair without additional cardiac testing.  He is not on any anticoagulation or antiplatelet therapy.  Wesley Bush, MD [facility] Pager: [phone]

## 2018-04-03 NOTE — Telephone Encounter (Signed)
Routed to number provided via EPIC fax.  

## 2018-04-07 ENCOUNTER — Telehealth: Payer: Self-pay | Admitting: *Deleted

## 2018-04-07 NOTE — Telephone Encounter (Signed)
-----   Message from Jules Husbands, MD sent at 04/03/2018  1:29 PM EDT ----- Here is a note from cards, We should wait until he gets cleared by endocrine. Thanks ----- Message ----- From: Leone Haven, MD Sent: 04/03/2018  11:46 AM EDT To: Jules Husbands, MD  Hi Dr Dahlia Byes,   I saw Mr Wesley Bridges yesterday for follow-up. It appears that he has been evaluated by cardiology for his blood pressure recently and had some non-specific findings on urine catecholamine testing. I have referred him to endocrinology for evaluation of this abnormality, though I think it would be a good idea for him to see them prior to proceeding with a surgery for his inguinal hernia's. We are going to work on getting him in to be seen. Please let me know if you have any questions.   [name]

## 2018-04-07 NOTE — Telephone Encounter (Signed)
Patient was unaware of the request from Dr. Caryl Bis to see endocrinology for clearance prior to surgery.   Per Dr. Ellen Henri office, they are working on a referral to Dr. Gabriel Carina at [facility].   Tentative plans have been made for patient to have hernia surgery on 04-17-18 at [facility] with Dr. Dahlia Byes. The patient is to pre-admit on 04-10-18 at 10:30 am. Patient is aware of date, time, and instructions.   We have received cardiac clearance from Dr. Harrell Gave End and this has been forwarded to the Pre-admission Testing Department.   Will await endocrinology clearance.

## 2018-04-08 ENCOUNTER — Other Ambulatory Visit: Payer: Self-pay | Admitting: Family Medicine

## 2018-04-08 DIAGNOSIS — R200 Anesthesia of skin: Secondary | ICD-10-CM | POA: Diagnosis not present

## 2018-04-08 DIAGNOSIS — R202 Paresthesia of skin: Secondary | ICD-10-CM | POA: Diagnosis not present

## 2018-04-08 DIAGNOSIS — R51 Headache: Secondary | ICD-10-CM | POA: Diagnosis not present

## 2018-04-08 DIAGNOSIS — M25521 Pain in right elbow: Secondary | ICD-10-CM

## 2018-04-08 NOTE — Telephone Encounter (Signed)
Patient insurance approved Bystolic and patient has the medication , co-pay is high gave patient information on how to reduce his co-pay.

## 2018-04-09 ENCOUNTER — Telehealth: Payer: Self-pay | Admitting: *Deleted

## 2018-04-09 NOTE — Telephone Encounter (Signed)
I will forward to [facility]. It appears this was submitted. Please check with [place] regarding this.

## 2018-04-09 NOTE — Telephone Encounter (Signed)
Copied from Christopher [id number]. Topic: General - Other >> Apr 09, 2018 12:25 PM Valla Leaver wrote: Reason for CRM: Sharyn Lull with [facility] (Dr. Gary Fleet) calling to request to speak with referral coordinator about endocrinology referral that was not received by the referred to office at [facility]. [phone]

## 2018-04-10 ENCOUNTER — Encounter
Admission: RE | Admit: 2018-04-10 | Discharge: 2018-04-10 | Disposition: A | Discharge status: Home / Self Care | Payer: 59 | Source: Ambulatory Visit | Attending: Surgery | Admitting: Surgery

## 2018-04-10 ENCOUNTER — Other Ambulatory Visit: Payer: Self-pay

## 2018-04-10 DIAGNOSIS — R51 Headache: Secondary | ICD-10-CM

## 2018-04-10 DIAGNOSIS — R6889 Other general symptoms and signs: Secondary | ICD-10-CM | POA: Diagnosis not present

## 2018-04-10 DIAGNOSIS — I10 Essential (primary) hypertension: Secondary | ICD-10-CM | POA: Diagnosis not present

## 2018-04-10 DIAGNOSIS — Z01812 Encounter for preprocedural laboratory examination: Secondary | ICD-10-CM | POA: Insufficient documentation

## 2018-04-10 DIAGNOSIS — R825 Elevated urine levels of drugs, medicaments and biological substances: Secondary | ICD-10-CM | POA: Diagnosis not present

## 2018-04-10 DIAGNOSIS — R519 Headache, unspecified: Secondary | ICD-10-CM | POA: Insufficient documentation

## 2018-04-10 DIAGNOSIS — R200 Anesthesia of skin: Secondary | ICD-10-CM | POA: Insufficient documentation

## 2018-04-10 DIAGNOSIS — R202 Paresthesia of skin: Secondary | ICD-10-CM

## 2018-04-10 HISTORY — DX: Unspecified osteoarthritis, unspecified site: M19.90

## 2018-04-10 HISTORY — DX: Depression, unspecified: F32.A

## 2018-04-10 HISTORY — DX: Family history of other specified conditions: Z84.89

## 2018-04-10 HISTORY — DX: Major depressive disorder, single episode, unspecified: F32.9

## 2018-04-10 HISTORY — DX: Angina pectoris, unspecified: I20.9

## 2018-04-10 LAB — CBC
HEMATOCRIT: 44.7 % (ref 39.0–52.0)
Hemoglobin: 14.8 g/dL (ref 13.0–17.0)
MCH: 30.6 pg (ref 26.0–34.0)
MCHC: 33.1 g/dL (ref 30.0–36.0)
MCV: 92.5 fL (ref 80.0–100.0)
Platelets: 302 10*3/uL (ref 150–400)
RBC: 4.83 MIL/uL (ref 4.22–5.81)
RDW: 13.2 % (ref 11.5–15.5)
WBC: 7.8 10*3/uL (ref 4.0–10.5)
nRBC: 0 % (ref 0.0–0.2)

## 2018-04-10 NOTE — Patient Instructions (Signed)
Your procedure is scheduled on: Thursday 04/17/18 Report to [place]. To find out your arrival time please call [phone] between 1PM - 3PM on Wednesday 04/16/18.  Remember: Instructions that are not followed completely may result in serious medical risk, up to and including death, or upon the discretion of your surgeon and anesthesiologist your surgery may need to be rescheduled.     _X__ 1. Do not eat food after midnight the night before your procedure.                 No gum chewing or hard candies. You may drink clear liquids up to 2 hours                 before you are scheduled to arrive for your surgery- DO not drink clear                 liquids within 2 hours of the start of your surgery.                 Clear Liquids include:  water, apple juice without pulp, clear carbohydrate                 drink such as Clearfast or Gatorade, Black Coffee or Tea (Do not add                 anything to coffee or tea).  __X__2.  On the morning of surgery brush your teeth with toothpaste and water, you                 may rinse your mouth with mouthwash if you wish.  Do not swallow any              toothpaste of mouthwash.     _X__ 3.  No Alcohol for 24 hours before or after surgery.   _X__ 4.  Do Not Smoke or use e-cigarettes For 24 Hours Prior to Your Surgery.                 Do not use any chewable tobacco products for at least 6 hours prior to                 surgery.  ____  5.  Bring all medications with you on the day of surgery if instructed.   __X__  6.  Notify your doctor if there is any change in your medical condition      (cold, fever, infections).     Do not wear jewelry, make-up, hairpins, clips or nail polish. Do not wear lotions, powders, or perfumes.  Do not shave 48 hours prior to surgery. Men may shave face and neck. Do not bring valuables to the hospital.    [facility] is not responsible for any belongings or  valuables.  Contacts, dentures/partials or body piercings may not be worn into surgery. Bring a case for your contacts, glasses or hearing aids, a denture cup will be supplied. Leave your suitcase in the car. After surgery it may be brought to your room. For patients admitted to the hospital, discharge time is determined by your treatment team.   Patients discharged the day of surgery will not be allowed to drive home.   Please read over the following fact sheets that you were given:   MRSA Information  __X__ Take these medicines the morning of surgery with A SIP OF WATER:  1. carvedilol (COREG)  2. escitalopram (LEXAPRO)  3. loratadine (CLARITIN)  4. nebivolol (BYSTOLIC)  5. pantoprazole (PROTONIX)   6.  ____ Fleet Enema (as directed)   __X__ Use CHG Soap/SAGE wipes as directed  ____ Use inhalers on the day of surgery  ____ Stop metformin/Janumet/Farxiga 2 days prior to surgery    ____ Take 1/2 of usual insulin dose the night before surgery. No insulin the morning          of surgery.   ____ Stop Blood Thinners Coumadin/Plavix/Xarelto/Pleta/Pradaxa/Eliquis/Effient/Aspirin  on   Or contact your Surgeon, Cardiologist or Medical Doctor regarding  ability to stop your blood thinners  __X__ Stop Anti-inflammatories 7 days before surgery such as Advil, Ibuprofen, Motrin,  BC or Goodies Powder, Naprosyn, Naproxen, Aleve, Aspirin    __X__ Stop all herbal supplements, fish oil or vitamin E until after surgery.    ____ Bring C-Pap to the hospital.

## 2018-04-11 NOTE — Telephone Encounter (Signed)
Yes, it was electronically sent to [facility] endocrinology on 10/11.

## 2018-04-14 DIAGNOSIS — M7711 Lateral epicondylitis, right elbow: Secondary | ICD-10-CM | POA: Diagnosis not present

## 2018-04-14 DIAGNOSIS — M25521 Pain in right elbow: Secondary | ICD-10-CM | POA: Diagnosis not present

## 2018-04-14 DIAGNOSIS — M25421 Effusion, right elbow: Secondary | ICD-10-CM | POA: Diagnosis not present

## 2018-04-15 ENCOUNTER — Ambulatory Visit (INDEPENDENT_AMBULATORY_CARE_PROVIDER_SITE_OTHER): Payer: 59 | Admitting: Gastroenterology

## 2018-04-15 ENCOUNTER — Other Ambulatory Visit: Payer: Self-pay

## 2018-04-15 ENCOUNTER — Encounter: Payer: Self-pay | Admitting: Gastroenterology

## 2018-04-15 VITALS — BP 103/66 | HR 58 | Ht 67.0 in | Wt 224.6 lb

## 2018-04-15 DIAGNOSIS — R1012 Left upper quadrant pain: Secondary | ICD-10-CM | POA: Diagnosis not present

## 2018-04-15 MED ORDER — DICYCLOMINE HCL 10 MG PO CAPS: 10.0000 mg | ORAL_CAPSULE | Freq: Three times a day (TID) | ORAL | 0 refills | Status: DC

## 2018-04-15 NOTE — Progress Notes (Signed)
Jonathon Bellows MD, MRCP(U.K) [address]  [place]  [place], [place] [address]  Main: [phone]  Fax: [phone]   Gastroenterology Consultation  Referring Provider:     Leone Haven, MD Primary Care Physician:  Leone Haven, MD Primary Gastroenterologist:  Dr. Jonathon Bellows  Reason for Consultation:     Abdominal pain         HPI:   Wesley Bridges is a 47 y.o. y/o male referred for consultation & management  by Dr. Caryl Bis, Angela Adam, MD.  He has previously been a patient of [facility]  And last seen back in 10/2015 for GERD.   He says he has had abdominal pain for a few years, he has an EGD a few years back and was told he has a hiatal hernia. Says was done 15 years back in [facility], since then been treated for acid reflux which works most of the times, says he has an episode once to twice week . Presently on Prilosec 40 mg at night .   He says the abdominal pain LUQ, on and off , when he has it last a few days, localized. No aggrevating factors, sharp at times. Relieves on its own .   He has a bowel movement twice daily - soft in nature- occasional diarrhea. No family history of colon cancer or polyps. Bowel movements do not change the pain. Does not have much gas. No NSAID's/ This pain is different from his heart burn.   Does not feel like his chest pain.  Past Medical History:  Diagnosis Date  . Anginal pain ([place])   . Arthritis   . Asthma   . Chronic headaches   . Depression   . Family history of adverse reaction to anesthesia    son takes longer to wake up than a normal person  . GERD (gastroesophageal reflux disease)   . Hyperlipidemia   . Hypertension   . Subclinical hyperthyroidism     Past Surgical History:  Procedure Laterality Date  . ANKLE FRACTURE SURGERY    . ANKLE FUSION     [facility]  . CARDIAC CATHETERIZATION    . FEMUR FRACTURE SURGERY    . FRACTURE SURGERY    . LEFT HEART CATH AND CORONARY ANGIOGRAPHY N/A 01/08/2017   Procedure:  Left Heart Cath and Coronary Angiography;  Surgeon: Nelva Bush, MD;  Location: [facility];  Service: Cardiovascular;  Laterality: N/A;  . MANDIBLE FRACTURE SURGERY      Prior to Admission medications   Medication Sig Start Date End Date Taking? Authorizing Provider  amLODipine (NORVASC) 5 MG tablet Take 1 tablet (5 mg total) by mouth daily. Patient not taking: Reported on 04/08/2018 01/23/18 04/23/18  End, Harrell Gave, MD  aspirin EC 81 MG tablet Take 81 mg by mouth daily.    [provider]  atorvastatin (LIPITOR) 40 MG tablet Take 1 tablet (40 mg total) by mouth daily. Patient taking differently: Take 40 mg by mouth at bedtime.  02/12/18   Leone Haven, MD  carvedilol (COREG) 6.25 MG tablet Take 6.25 mg by mouth 2 (two) times daily with a meal.  03/09/18   [provider]  escitalopram (LEXAPRO) 20 MG tablet Take 1 tablet (20 mg total) by mouth daily. 04/02/18   Leone Haven, MD  fluticasone (FLONASE) 50 MCG/ACT nasal spray Place 2 sprays into both nostrils daily.  08/28/17   [provider]  lisinopril (PRINIVIL,ZESTRIL) 40 MG tablet Take 1 tablet (  40 mg total) by mouth daily. 07/29/17 07/24/18  Leone Haven, MD  loratadine (CLARITIN) 10 MG tablet Take 1 tablet (10 mg total) by mouth daily. Patient taking differently: Take 20 mg by mouth daily.  04/18/16   Leone Haven, MD  Multiple Vitamin (MULTIVITAMIN WITH MINERALS) TABS tablet Take 1 tablet by mouth at bedtime.    [provider]  nebivolol (BYSTOLIC) 10 MG tablet Take 1 tablet (10 mg total) by mouth daily. 03/28/18   Leone Haven, MD  nitroGLYCERIN (NITROSTAT) 0.4 MG SL tablet DISSOLVE 1 TABLET UNDER THE TONGUE EVERY 5 MINUTES AS NEEDED FOR CHEST PAIN. MAXIMUM DAILY DOSE IS 3 TABLETS Patient taking differently: Place 0.4 mg under the tongue every 5 (five) minutes as needed for chest pain.  09/02/17   End, Harrell Gave, MD  nortriptyline (PAMELOR) 10 MG capsule Take  10-20 mg by mouth See admin instructions. Take 10mg  by mouth for one week, then increase to 20mg  nightly    [provider]  pantoprazole (PROTONIX) 40 MG tablet TAKE 1 TABLET(40 MG) BY MOUTH AT BEDTIME Patient taking differently: Take 40 mg by mouth at bedtime.  02/12/18   Leone Haven, MD    Family History  Problem Relation Age of Onset  . Arthritis Unknown        parents  . Hypertension Mother   . Thyroid disease Mother   . Osteoporosis Mother   . Syncope episode Mother   . Hypertension Brother      Social History   Tobacco Use  . Smoking status: Never Smoker  . Smokeless tobacco: Never Used  Substance Use Topics  . Alcohol use: Yes    Alcohol/week: 14.0 standard drinks    Types: 14 Cans of beer per week  . Drug use: No    Allergies as of 04/15/2018 - Review Complete 04/10/2018  Allergen Reaction Noted  . Pollen extract      Review of Systems:    All systems reviewed and negative except where noted in HPI.   Physical Exam:  There were no vitals taken for this visit. No LMP for male patient. Psych:  Alert and cooperative. Normal mood and affect. General:   Alert,  Well-developed, well-nourished, pleasant and cooperative in NAD Head:  Normocephalic and atraumatic. Eyes:  Sclera clear, no icterus.   Conjunctiva pink. Ears:  Normal auditory acuity. Nose:  No deformity, discharge, or lesions. Mouth:  No deformity or lesions,oropharynx pink & moist. Neck:  Supple; no masses or thyromegaly. Lungs:  Respirations even and unlabored.  Clear throughout to auscultation.   No wheezes, crackles, or rhonchi. No acute distress. Heart:  Regular rate and rhythm; no murmurs, clicks, rubs, or gallops. Abdomen:  Normal bowel sounds.  No bruits.  Soft, non-tender and non-distended without masses, hepatosplenomegaly or hernias noted.  No guarding or rebound tenderness.    Neurologic:  Alert and oriented x3;  grossly normal neurologically. Skin:  Intact without significant  lesions or rashes. No jaundice. Lymph Nodes:  No significant cervical adenopathy. Psych:  Alert and cooperative. Normal mood and affect.  Imaging Studies: Dg Elbow Complete Right  Result Date: 04/02/2018 CLINICAL DATA:  Decreased range of motion/extension RIGHT elbow, no known injury EXAM: RIGHT ELBOW - COMPLETE 3+ VIEW COMPARISON:  None. FINDINGS: Osseous mineralization normal. Joint spaces preserved. Elbow joint effusion present. No fracture, dislocation or bone destruction. IMPRESSION: Elbow joint effusion without acute osseous findings. Electronically Signed   By: Lavonia Dana M.D.   On: 04/02/2018 17:47  Assessment and Plan:   ADIT RIDDLES is a 47 y.o. y/o male has been referred for LUQ pain, his history is not very specific for any clear condition , does not fit IBS or GERD. May be a form of dyspepsia.   Plan  1. GERD patient information - take prilosec 30 mins before breakfast in the am  2. EGD+c olonoscopy to evaluate LUQ pain  3. H pylori breath test  4. Bentyl TID prn  5. At next visit if no better then will get CT abdomen   I have discussed alternative options, risks & benefits,  which include, but are not limited to, bleeding, infection, perforation,respiratory complication & drug reaction.  The patient agrees with this plan & written consent will be obtained.    Follow up in 6 weeks   Dr Jonathon Bellows MD,MRCP(U.K)

## 2018-04-15 NOTE — Patient Instructions (Signed)

## 2018-04-16 ENCOUNTER — Telehealth: Payer: Self-pay | Admitting: *Deleted

## 2018-04-16 MED ORDER — CEFAZOLIN SODIUM-DEXTROSE 2-4 GM/100ML-% IV SOLN
2.0000 g | INTRAVENOUS | Status: AC
  Administered 2018-04-17: 2 g via INTRAVENOUS

## 2018-04-16 NOTE — Telephone Encounter (Signed)
Patient was seen by Dr. Honor Junes at [facility] to rule out pheochromocytoma which he does not have.   We have received clearance from the cardiologist, Dr. Saunders Revel.   Dr. Dahlia Byes notified and records reviewed. Okay to proceed with surgery tomorrow.   Records forwarded to Same Day Surgery and received per Debbie.   Patient notified of the above and reminded to call for arrival time later today to find out time what to arrive for surgery tomorrow.

## 2018-04-17 ENCOUNTER — Ambulatory Visit
Admission: RE | Admit: 2018-04-17 | Discharge: 2018-04-17 | Disposition: A | Discharge status: Home / Self Care | Payer: 59 | Source: Ambulatory Visit | Attending: Surgery | Admitting: Surgery

## 2018-04-17 ENCOUNTER — Other Ambulatory Visit: Payer: Self-pay

## 2018-04-17 ENCOUNTER — Ambulatory Visit: Payer: 59 | Admitting: Registered Nurse

## 2018-04-17 ENCOUNTER — Encounter
Admission: RE | Disposition: A | Discharge status: Home / Self Care | Payer: Self-pay | Source: Ambulatory Visit | Attending: Surgery

## 2018-04-17 DIAGNOSIS — I10 Essential (primary) hypertension: Secondary | ICD-10-CM | POA: Diagnosis not present

## 2018-04-17 DIAGNOSIS — K219 Gastro-esophageal reflux disease without esophagitis: Secondary | ICD-10-CM | POA: Diagnosis not present

## 2018-04-17 DIAGNOSIS — K4020 Bilateral inguinal hernia, without obstruction or gangrene, not specified as recurrent: Secondary | ICD-10-CM

## 2018-04-17 DIAGNOSIS — F329 Major depressive disorder, single episode, unspecified: Secondary | ICD-10-CM | POA: Insufficient documentation

## 2018-04-17 DIAGNOSIS — Z79899 Other long term (current) drug therapy: Secondary | ICD-10-CM | POA: Diagnosis not present

## 2018-04-17 DIAGNOSIS — D176 Benign lipomatous neoplasm of spermatic cord: Secondary | ICD-10-CM | POA: Diagnosis not present

## 2018-04-17 DIAGNOSIS — E785 Hyperlipidemia, unspecified: Secondary | ICD-10-CM | POA: Insufficient documentation

## 2018-04-17 DIAGNOSIS — Z7951 Long term (current) use of inhaled steroids: Secondary | ICD-10-CM | POA: Diagnosis not present

## 2018-04-17 HISTORY — PX: ROBOT ASSISTED INGUINAL HERNIA REPAIR: SHX6561

## 2018-04-17 SURGERY — ROBOT ASSISTED INGUINAL HERNIA REPAIR
Anesthesia: General | Laterality: Bilateral

## 2018-04-17 MED ORDER — DEXAMETHASONE SODIUM PHOSPHATE 10 MG/ML IJ SOLN
INTRAMUSCULAR | Status: DC | PRN
  Administered 2018-04-17: 10 mg via INTRAVENOUS

## 2018-04-17 MED ORDER — HYDROCODONE-ACETAMINOPHEN 5-325 MG PO TABS
1.0000 | ORAL_TABLET | Freq: Four times a day (QID) | ORAL | Status: DC | PRN
  Administered 2018-04-17: 1 via ORAL

## 2018-04-17 MED ORDER — MIDAZOLAM HCL 2 MG/2ML IJ SOLN
INTRAMUSCULAR | Status: DC | PRN
  Administered 2018-04-17: 2 mg via INTRAVENOUS

## 2018-04-17 MED ORDER — GLYCOPYRROLATE 0.2 MG/ML IJ SOLN
INTRAMUSCULAR | Status: AC
  Filled 2018-04-17: qty 1

## 2018-04-17 MED ORDER — LACTATED RINGERS IV SOLN
INTRAVENOUS | Status: DC
  Administered 2018-04-17: 11:00:00 via INTRAVENOUS

## 2018-04-17 MED ORDER — FENTANYL CITRATE (PF) 100 MCG/2ML IJ SOLN
INTRAMUSCULAR | Status: AC
  Filled 2018-04-17: qty 2

## 2018-04-17 MED ORDER — SUGAMMADEX SODIUM 200 MG/2ML IV SOLN
INTRAVENOUS | Status: DC | PRN
  Administered 2018-04-17: 205 mg via INTRAVENOUS

## 2018-04-17 MED ORDER — EPHEDRINE SULFATE 50 MG/ML IJ SOLN
INTRAMUSCULAR | Status: DC | PRN
  Administered 2018-04-17 (×3): 5 mg via INTRAVENOUS
  Administered 2018-04-17 (×2): 10 mg via INTRAVENOUS

## 2018-04-17 MED ORDER — HYDROCODONE-ACETAMINOPHEN 5-325 MG PO TABS
ORAL_TABLET | ORAL | Status: AC
  Administered 2018-04-17: 1 via ORAL
  Filled 2018-04-17: qty 1

## 2018-04-17 MED ORDER — SUCCINYLCHOLINE CHLORIDE 20 MG/ML IJ SOLN
INTRAMUSCULAR | Status: AC
  Filled 2018-04-17: qty 1

## 2018-04-17 MED ORDER — CARVEDILOL 6.25 MG PO TABS
6.2500 mg | ORAL_TABLET | Freq: Two times a day (BID) | ORAL | Status: DC
  Administered 2018-04-17: 6.25 mg via ORAL
  Filled 2018-04-17 (×2): qty 1

## 2018-04-17 MED ORDER — FENTANYL CITRATE (PF) 100 MCG/2ML IJ SOLN
INTRAMUSCULAR | Status: DC | PRN
  Administered 2018-04-17: 25 ug via INTRAVENOUS
  Administered 2018-04-17: 50 ug via INTRAVENOUS
  Administered 2018-04-17: 25 ug via INTRAVENOUS

## 2018-04-17 MED ORDER — PROMETHAZINE HCL 25 MG/ML IJ SOLN
6.2500 mg | INTRAMUSCULAR | Status: DC | PRN
  Administered 2018-04-17: 12.5 mg via INTRAVENOUS

## 2018-04-17 MED ORDER — MEPERIDINE HCL 50 MG/ML IJ SOLN: 6.2500 mg | INTRAMUSCULAR | Status: DC | PRN

## 2018-04-17 MED ORDER — SUCCINYLCHOLINE CHLORIDE 20 MG/ML IJ SOLN
INTRAMUSCULAR | Status: DC | PRN
  Administered 2018-04-17: 110 mg via INTRAVENOUS

## 2018-04-17 MED ORDER — PROPOFOL 10 MG/ML IV BOLUS
INTRAVENOUS | Status: DC | PRN
  Administered 2018-04-17: 200 mg via INTRAVENOUS
  Administered 2018-04-17: 30 mg via INTRAVENOUS

## 2018-04-17 MED ORDER — ONDANSETRON HCL 4 MG/2ML IJ SOLN
INTRAMUSCULAR | Status: DC | PRN
  Administered 2018-04-17: 4 mg via INTRAVENOUS

## 2018-04-17 MED ORDER — ACETAMINOPHEN 500 MG PO TABS
1000.0000 mg | ORAL_TABLET | ORAL | Status: AC
  Administered 2018-04-17: 1000 mg via ORAL

## 2018-04-17 MED ORDER — BUPIVACAINE-EPINEPHRINE 0.25% -1:200000 IJ SOLN
INTRAMUSCULAR | Status: DC | PRN
  Administered 2018-04-17: 30 mL

## 2018-04-17 MED ORDER — SUGAMMADEX SODIUM 200 MG/2ML IV SOLN
INTRAVENOUS | Status: AC
  Filled 2018-04-17: qty 2

## 2018-04-17 MED ORDER — MIDAZOLAM HCL 2 MG/2ML IJ SOLN
INTRAMUSCULAR | Status: AC
  Filled 2018-04-17: qty 2

## 2018-04-17 MED ORDER — DEXMEDETOMIDINE HCL 200 MCG/2ML IV SOLN
INTRAVENOUS | Status: DC | PRN
  Administered 2018-04-17: 12 ug via INTRAVENOUS

## 2018-04-17 MED ORDER — ROCURONIUM BROMIDE 50 MG/5ML IV SOLN
INTRAVENOUS | Status: AC
  Filled 2018-04-17: qty 1

## 2018-04-17 MED ORDER — CELECOXIB 200 MG PO CAPS
200.0000 mg | ORAL_CAPSULE | ORAL | Status: AC
  Administered 2018-04-17: 200 mg via ORAL

## 2018-04-17 MED ORDER — ONDANSETRON HCL 4 MG/2ML IJ SOLN
INTRAMUSCULAR | Status: AC
  Filled 2018-04-17: qty 2

## 2018-04-17 MED ORDER — DEXAMETHASONE SODIUM PHOSPHATE 10 MG/ML IJ SOLN
INTRAMUSCULAR | Status: AC
  Filled 2018-04-17: qty 1

## 2018-04-17 MED ORDER — CHLORHEXIDINE GLUCONATE CLOTH 2 % EX PADS: 6.0000 | MEDICATED_PAD | Freq: Once | CUTANEOUS | Status: DC

## 2018-04-17 MED ORDER — LIDOCAINE HCL (PF) 2 % IJ SOLN
INTRAMUSCULAR | Status: AC
  Filled 2018-04-17: qty 10

## 2018-04-17 MED ORDER — GABAPENTIN 300 MG PO CAPS
300.0000 mg | ORAL_CAPSULE | ORAL | Status: AC
  Administered 2018-04-17: 300 mg via ORAL

## 2018-04-17 MED ORDER — HYDROCODONE-ACETAMINOPHEN 5-325 MG PO TABS: 1.0000 | ORAL_TABLET | Freq: Four times a day (QID) | ORAL | 0 refills | Status: DC | PRN

## 2018-04-17 MED ORDER — FENTANYL CITRATE (PF) 100 MCG/2ML IJ SOLN
25.0000 ug | INTRAMUSCULAR | Status: DC | PRN
  Administered 2018-04-17: 25 ug via INTRAVENOUS

## 2018-04-17 MED ORDER — LIDOCAINE HCL (CARDIAC) PF 100 MG/5ML IV SOSY
PREFILLED_SYRINGE | INTRAVENOUS | Status: DC | PRN
  Administered 2018-04-17: 100 mg via INTRAVENOUS

## 2018-04-17 MED ORDER — ROCURONIUM BROMIDE 100 MG/10ML IV SOLN
INTRAVENOUS | Status: DC | PRN
  Administered 2018-04-17: 10 mg via INTRAVENOUS
  Administered 2018-04-17 (×2): 15 mg via INTRAVENOUS
  Administered 2018-04-17: 25 mg via INTRAVENOUS

## 2018-04-17 MED ORDER — OXYCODONE HCL 5 MG PO TABS
ORAL_TABLET | ORAL | Status: AC
  Filled 2018-04-17: qty 1

## 2018-04-17 MED ORDER — GLYCOPYRROLATE 0.2 MG/ML IJ SOLN
INTRAMUSCULAR | Status: DC | PRN
  Administered 2018-04-17: 0.2 mg via INTRAVENOUS

## 2018-04-17 MED ORDER — BUPIVACAINE-EPINEPHRINE (PF) 0.25% -1:200000 IJ SOLN
INTRAMUSCULAR | Status: AC
  Filled 2018-04-17: qty 30

## 2018-04-17 MED ORDER — OXYCODONE HCL 5 MG PO TABS
5.0000 mg | ORAL_TABLET | Freq: Once | ORAL | Status: AC | PRN
  Administered 2018-04-17: 5 mg via ORAL

## 2018-04-17 MED ORDER — PROMETHAZINE HCL 25 MG/ML IJ SOLN
INTRAMUSCULAR | Status: AC
  Administered 2018-04-17: 12.5 mg via INTRAVENOUS
  Filled 2018-04-17: qty 1

## 2018-04-17 MED ORDER — DEXMEDETOMIDINE HCL IN NACL 80 MCG/20ML IV SOLN
INTRAVENOUS | Status: AC
  Filled 2018-04-17: qty 20

## 2018-04-17 MED ORDER — OXYCODONE HCL 5 MG/5ML PO SOLN: 5.0000 mg | Freq: Once | ORAL | Status: AC | PRN

## 2018-04-17 MED ORDER — ALBUTEROL SULFATE HFA 108 (90 BASE) MCG/ACT IN AERS
INHALATION_SPRAY | RESPIRATORY_TRACT | Status: DC | PRN
  Administered 2018-04-17: 6 via RESPIRATORY_TRACT

## 2018-04-17 SURGICAL SUPPLY — 43 items
BLADE SURG 15 STRL SS SAFETY (BLADE) ×2 IMPLANT
CANISTER SUCT 1200ML W/VALVE (MISCELLANEOUS) ×2 IMPLANT
CHLORAPREP W/TINT 26ML (MISCELLANEOUS) ×2 IMPLANT
CORD BIP STRL DISP 12FT (MISCELLANEOUS) ×2 IMPLANT
COVER TIP SHEARS 8 DVNC (MISCELLANEOUS) ×1 IMPLANT
COVER TIP SHEARS 8MM DA VINCI (MISCELLANEOUS) ×1
COVER WAND RF STERILE (DRAPES) ×2 IMPLANT
DEFOGGER SCOPE WARMER CLEARIFY (MISCELLANEOUS) ×2 IMPLANT
DERMABOND ADVANCED (GAUZE/BANDAGES/DRESSINGS) ×1
DERMABOND ADVANCED .7 DNX12 (GAUZE/BANDAGES/DRESSINGS) ×1 IMPLANT
DRAPE 3 ARM ACCESS DA VINCI (DRAPES) ×1
DRAPE 3 ARM ACCESS DVNC (DRAPES) ×1 IMPLANT
DRAPE SHEET LG 3/4 BI-LAMINATE (DRAPES) ×4 IMPLANT
ELECT CAUTERY BLADE TIP 2.5 (TIP) ×2
ELECT REM PT RETURN 9FT ADLT (ELECTROSURGICAL) ×2
ELECTRODE CAUTERY BLDE TIP 2.5 (TIP) ×1 IMPLANT
ELECTRODE REM PT RTRN 9FT ADLT (ELECTROSURGICAL) ×1 IMPLANT
GLOVE BIO SURGEON STRL SZ7 (GLOVE) ×8 IMPLANT
GOWN STRL REUS W/ TWL LRG LVL3 (GOWN DISPOSABLE) ×3 IMPLANT
GOWN STRL REUS W/TWL LRG LVL3 (GOWN DISPOSABLE) ×3
IV NS 1000ML (IV SOLUTION)
IV NS 1000ML BAXH (IV SOLUTION) IMPLANT
KIT PINK PAD W/HEAD ARE REST (MISCELLANEOUS) ×2
KIT PINK PAD W/HEAD ARM REST (MISCELLANEOUS) ×1 IMPLANT
LABEL OR SOLS (LABEL) ×2 IMPLANT
MESH 3DMAX 4X6 LT LRG (Mesh General) ×2 IMPLANT
MESH 3DMAX 4X6 RT LRG (Mesh General) ×2 IMPLANT
NEEDLE HYPO 22GX1.5 SAFETY (NEEDLE) ×2 IMPLANT
PACK LAP CHOLECYSTECTOMY (MISCELLANEOUS) ×2 IMPLANT
PENCIL ELECTRO HAND CTR (MISCELLANEOUS) ×2 IMPLANT
PROGRASP ENDOWRIST DA VINCI (INSTRUMENTS) ×1
PROGRASP ENDOWRIST DVNC (INSTRUMENTS) ×1 IMPLANT
SOLUTION ELECTROLUBE (MISCELLANEOUS) ×2 IMPLANT
SPONGE LAP 18X18 RF (DISPOSABLE) ×2 IMPLANT
STRAP SAFETY 5IN WIDE (MISCELLANEOUS) ×2 IMPLANT
SUT MNCRL AB 4-0 PS2 18 (SUTURE) ×2 IMPLANT
SUT VIC AB 2-0 SH 27 (SUTURE) ×2
SUT VIC AB 2-0 SH 27XBRD (SUTURE) ×2 IMPLANT
SUT VICRYL 0 AB UR-6 (SUTURE) ×4 IMPLANT
SUT VLOC 90 S/L VL9 GS22 (SUTURE) ×4 IMPLANT
SYR 20CC LL (SYRINGE) ×2 IMPLANT
TROCAR XCEL BLUNT TIP 100MML (ENDOMECHANICALS) ×2 IMPLANT
TUBING INSUF HEATED (TUBING) ×2 IMPLANT

## 2018-04-17 NOTE — Transfer of Care (Signed)
Immediate Anesthesia Transfer of Care Note  Patient: Wesley Bridges  Procedure(s) Performed: ROBOT ASSISTED INGUINAL HERNIA REPAIR (Bilateral )  Patient Location: PACU  Anesthesia Type:General  Level of Consciousness: sedated  Airway & Oxygen Therapy: Patient Spontanous Breathing and Patient connected to face mask oxygen  Post-op Assessment: Report given to RN and Post -op Vital signs reviewed and stable  Post vital signs: stable  Last Vitals:  Vitals Value Taken Time  BP 103/72 04/17/2018 10:56 AM  Temp    Pulse 57 04/17/2018 11:03 AM  Resp 19 04/17/2018 11:03 AM  SpO2 98 % 04/17/2018 11:03 AM  Vitals shown include unvalidated device data.  Last Pain:  Vitals:   04/17/18 0806  TempSrc: Tympanic  PainSc: 0-No pain         Complications: No apparent anesthesia complications

## 2018-04-17 NOTE — Anesthesia Preprocedure Evaluation (Signed)
Anesthesia Evaluation  Patient identified by MRN, date of birth, ID band Patient awake    Reviewed: Allergy & Precautions, NPO status , Patient's Chart, lab work & pertinent test results  History of Anesthesia Complications Negative for: history of anesthetic complications  Airway Mallampati: IV  TM Distance: >3 FB Neck ROM: Full    Dental no notable dental hx.    Pulmonary asthma , neg sleep apnea,    breath sounds clear to auscultation- rhonchi (-) wheezing      Cardiovascular hypertension, (-) CAD, (-) Past MI, (-) Cardiac Stents and (-) CABG  Rhythm:Regular Rate:Normal - Systolic murmurs and - Diastolic murmurs    Neuro/Psych  Headaches, PSYCHIATRIC DISORDERS Depression    GI/Hepatic Neg liver ROS, GERD  ,  Endo/Other  neg diabetes  Renal/GU negative Renal ROS     Musculoskeletal  (+) Arthritis ,   Abdominal (+) + obese,   Peds  Hematology negative hematology ROS (+)   Anesthesia Other Findings Past Medical History: No date: Anginal pain ([facility]) No date: Arthritis No date: Asthma No date: Chronic headaches No date: Depression No date: Family history of adverse reaction to anesthesia     Comment:  son takes longer to wake up than a normal person No date: GERD (gastroesophageal reflux disease) No date: Hyperlipidemia No date: Hypertension No date: Subclinical hyperthyroidism   Reproductive/Obstetrics                             Anesthesia Physical Anesthesia Plan  ASA: II  Anesthesia Plan: General   Post-op Pain Management:    Induction: Intravenous  PONV Risk Score and Plan: 1 and Ondansetron and Midazolam  Airway Management Planned: Oral ETT and Video Laryngoscope Planned  Additional Equipment:   Intra-op Plan:   Post-operative Plan: Extubation in OR  Informed Consent: I have reviewed the patients History and Physical, chart, labs and discussed the procedure  including the risks, benefits and alternatives for the proposed anesthesia with the patient or authorized representative who has indicated his/her understanding and acceptance.   Dental advisory given  Plan Discussed with: CRNA and Anesthesiologist  Anesthesia Plan Comments:         Anesthesia Quick Evaluation

## 2018-04-17 NOTE — Anesthesia Post-op Follow-up Note (Signed)
Anesthesia QCDR form completed.        

## 2018-04-17 NOTE — Op Note (Signed)
Robotic assisted laparoscopic Bilateral Inguinal Hernia Repair with large 3D mesh  Wesley Bridges  04/17/2018  Pre-operative Diagnosis: Bilateral Inguinal Hernia  Post-operative Diagnosis: Bilateral  Inguinal hernia  Procedure: Robotic assisted laparoscopic transabdominal repair of bilateral inguinal hernias  Surgeon: Caroleen Hamman, MD FACS  Anesthesia: Gen. with endotracheal tube   Findings: Indirect BIH  Procedure Details  The patient was seen again in the Holding Room. The benefits, complications, treatment options, and expected outcomes were discussed with the patient. The risks of bleeding, infection, recurrence of symptoms, failure to resolve symptoms, recurrence of hernia, ischemic orchitis, chronic pain syndrome or neuroma, were discussed again. The likelihood of improving the patient's symptoms with return to their baseline status is good.  The patient and/or family concurred with the proposed plan, giving informed consent.  The patient was taken to Operating Room, identified as [place] and the procedure verified as Laparoscopic Inguinal Hernia Repair. Laterality confirmed.  A Time Out was held and the above information confirmed.  Prior to the induction of general anesthesia, antibiotic prophylaxis was administered. VTE prophylaxis was in place. General endotracheal anesthesia was then administered and tolerated well. After the induction, the abdomen was prepped with Chloraprep and draped in the sterile fashion. The patient was positioned in the supine position.  Supra umbilical incision was created with a 15 blade knife and fascia was elevated between clamps.  Fascia was incised and the middle cavity was entered under direct visualization.  2 stay sutures were placed on each side of the fascia.  Hassan trocar placed and pneumoperitoneum obtained.  Diagnostic laparoscopy revealed evidence of bilateral inguinal hernias.  At this time 2 8 mm ports were placed on  each side of the abdominal wall under direct visualization.  We went ahead and place to large 3D mesh into the abdominal cavity under direct visualization.  The robot was brought to the surgical field and docked in the standard fashion.  We made sure there was no evidence of collision between arms and instrumentation was seen at all times.  I scrubbed out and went to the console.  Started with the symptomatic side that was the right side on the larger side.  The peritoneum was identified and a flap was created with electrocautery.  Were able to delineate Cooper's ligament the pubic bone as well as the cord structures.  The hernia sac was reduced completely as well as a lipoma of the cord.  We were able to identify the vas deferens the epigastric vessels the Iliac  vessels protected the structures at all times.  The mesh was placed to cover the direct, indirect and femoral spaces.  It was sutured medially with 2 interrupted 2-0 Vicryl sutures intracorporeally.  The peritoneal flap was closed with a running 2 O V lock suture.  Please note that then we turned to our attention to the left side and identical steps were performed we also inserted a large 3D mesh on that side. All the instrumentation and needles were removed and the robot was undocked  Once assuring that hemostasis was adequate the ports were removed and a figure-of-eight 0 Vicryl suture was placed at the fascial edges. 4-0 subcuticular Monocryl was used at all skin edges. Dermabond was placed.  Patient tolerated the procedure well. There were no complications. He was taken to the recovery room in stable condition.               Caroleen Hamman, MD, FACS

## 2018-04-17 NOTE — Anesthesia Procedure Notes (Signed)
Procedure Name: Intubation Performed by: Lia Foyer, RN Pre-anesthesia Checklist: Patient identified, Patient being monitored, Timeout performed, Emergency Drugs available and Suction available Patient Re-evaluated:Patient Re-evaluated prior to induction Oxygen Delivery Method: Circle system utilized Preoxygenation: Pre-oxygenation with 100% oxygen Induction Type: IV induction Ventilation: Oral airway inserted - appropriate to patient size and Mask ventilation without difficulty Laryngoscope Size: McGraph and 4 Grade View: Grade I Tube type: Oral Tube size: 8.0 mm Number of attempts: 1 Airway Equipment and Method: Stylet and Video-laryngoscopy Placement Confirmation: ETT inserted through vocal cords under direct vision,  positive ETCO2 and breath sounds checked- equal and bilateral Secured at: 21 cm Tube secured with: Tape Dental Injury: Injury to lip  Difficulty Due To: Difficulty was anticipated, Difficult Airway- due to limited oral opening and Difficult Airway- due to large tongue Future Recommendations: Recommend- induction with short-acting agent, and alternative techniques readily available

## 2018-04-17 NOTE — Discharge Instructions (Addendum)
Laparoscopic Inguinal Hernia Repair, Adult, Care After This sheet gives you information about how to care for yourself after your procedure. Your health care provider may also give you more specific instructions. If you have problems or questions, contact your health care provider. What can I expect after the procedure? After the procedure, it is common to have:  Pain.  Swelling and bruising around the incision area.  Scrotal swelling, in men.  Some fluid or blood draining from your incisions.  Follow these instructions at home: Incision care  Follow instructions from your health care provider about how to take care of your incisions. Make sure you: ? Wash your hands with soap and water before you change your bandage (dressing). If soap and water are not available, use hand sanitizer. ? Change your dressing as told by your health care provider. ? Leave stitches (sutures), skin glue, or adhesive strips in place. These skin closures may need to stay in place for 2 weeks or longer. If adhesive strip edges start to loosen and curl up, you may trim the loose edges. Do not remove adhesive strips completely unless your health care provider tells you to do that.  Check your incision area every day for signs of infection. Check for: ? More redness, swelling, or pain. ? More fluid or blood. ? Warmth. ? Pus or a bad smell.  Wear loose, soft clothing while your incisions heal. Driving  Do not drive or use heavy machinery while taking prescription pain medicine.  Do not drive for 24 hours if you were given a medicine to help you relax (sedative) during your procedure. Activity  Do not lift anything that is heavier than 10 lb (4.5 kg), or the limit that you are told, until your health care provider says that it is safe.  Ask your health care provider what activities are safe for you.A lot of activity during the first week after surgery can increase pain and swelling. For 1 week after your  procedure: ? Avoid activities that take a lot of effort, such as exercise or sports. ? You may walk and climb stairs as needed for daily activity, but avoid long walks or climbing stairs for exercise. Managing pain and swelling    Put ice on painful or swollen areas: ? Put ice in a plastic bag. ? Place a towel between your skin and the bag. ? Leave the ice on for 20 minutes, 2-3 times a day. General instructions  Do not take baths, swim, or use a hot tub until your health care provider approves. Ask your health care provider if you may take showers. You may only be allowed to take sponge baths.  Take over-the-counter and prescription medicines only as told by your health care provider.  To prevent or treat constipation while you are taking prescription pain medicine, your health care provider may recommend that you: ? Drink enough fluid to keep your urine pale yellow. ? Take over-the-counter or prescription medicines. ? Eat foods that are high in fiber, such as fresh fruits and vegetables, whole grains, and beans. ? Limit foods that are high in fat and processed sugars, such as fried and sweet foods.  Do not use any products that contain nicotine or tobacco, such as cigarettes and e-cigarettes. If you need help quitting, ask your health care provider.  Drink enough fluid to keep your urine pale yellow.  Keep all follow-up visits as told by your health care provider. This is important. Contact a health care provider if:  You have more redness, swelling, or pain around your incisions or your groin area.  You have more swelling in your scrotum.  You have more fluid or blood coming from your incisions.  Your incisions feel warm to the touch.  You have severe pain and medicines do not help.  You have abdominal pain or swelling.  You cannot eat or drink without vomiting.  You cannot urinate or pass a bowel movement.  You faint.  You feel dizzy.  You have nausea and  vomiting.  You have a fever. Get help right away if:  You have pus or a bad smell coming from your incisions.  You have chest pain.  You have problems breathing. Summary  Pain, swelling, and bruising are common after the procedure.  Check your incision area every day for signs of infection, such as more redness, swelling, or pain.  Put ice on painful or swollen areas for 20 minutes, 2-3 times a day. This information is not intended to replace advice given to you by your health care provider. Make sure you discuss any questions you have with your health care provider. Document Released: 09/20/2016 Document Revised: 09/20/2016 Document Reviewed: 09/20/2016 Elsevier Interactive Patient Education  2018 [place]   1) The drugs that you were given will stay in your system until tomorrow so for the next 24 hours you should not:  A) Drive an automobile B) Make any legal decisions C) Drink any alcoholic beverage   2) You may resume regular meals tomorrow.  Today it is better to start with liquids and gradually work up to solid foods.  You may eat anything you prefer, but it is better to start with liquids, then soup and crackers, and gradually work up to solid foods.   3) Please notify your doctor immediately if you have any unusual bleeding, trouble breathing, redness and pain at the surgery site, drainage, fever, or pain not relieved by medication.    4) Additional Instructions: TAKE A STOOL SOFTENER TWICE A DAY WHILE TAKING NARCOTIC PAIN MEDICINE TO PREVENT CONSTIPATION   Please contact your physician with any problems or Same Day Surgery at [phone], Monday through Friday 6 am to 4 pm, or [place] at [facility] number at [phone].

## 2018-04-17 NOTE — Interval H&P Note (Signed)
History and Physical Interval Note:  04/17/2018 8:34 AM  Wesley Bridges  has presented today for surgery, with the diagnosis of bilateral inguinal hernia  The various methods of treatment have been discussed with the patient and family. After consideration of risks, benefits and other options for treatment, the patient has consented to  Procedure(s): [place] (Bilateral) as a surgical intervention .  The patient's history has been reviewed, patient examined, no change in status, stable for surgery.  I have reviewed the patient's chart and labs.  Questions were answered to the patient's satisfaction.     [place]

## 2018-04-17 NOTE — Anesthesia Postprocedure Evaluation (Signed)
Anesthesia Post Note  Patient: Wesley Bridges  Procedure(s) Performed: ROBOT ASSISTED INGUINAL HERNIA REPAIR (Bilateral )  Patient location during evaluation: PACU Anesthesia Type: General Level of consciousness: awake and alert and oriented Pain management: pain level controlled Vital Signs Assessment: post-procedure vital signs reviewed and stable Respiratory status: spontaneous breathing, nonlabored ventilation and respiratory function stable Cardiovascular status: blood pressure returned to baseline and stable Postop Assessment: no signs of nausea or vomiting Anesthetic complications: no     Last Vitals:  Vitals:   04/17/18 1056 04/17/18 1111  BP: 103/72 102/71  Pulse: 62 63  Resp: (!) 22 15  Temp: (!) 36.3 C   SpO2: 99% 92%    Last Pain:  Vitals:   04/17/18 1111  TempSrc:   PainSc: 6                  Alahni Varone

## 2018-04-18 ENCOUNTER — Encounter: Payer: Self-pay | Admitting: Surgery

## 2018-04-18 ENCOUNTER — Other Ambulatory Visit: Payer: Self-pay

## 2018-04-18 ENCOUNTER — Telehealth: Payer: Self-pay | Admitting: Internal Medicine

## 2018-04-18 ENCOUNTER — Telehealth: Payer: Self-pay

## 2018-04-18 DIAGNOSIS — R1012 Left upper quadrant pain: Secondary | ICD-10-CM

## 2018-04-18 NOTE — Telephone Encounter (Signed)
-----   Message from Jules Husbands, MD sent at 04/18/2018  9:52 AM EDT ----- Regarding: RE: Pt Endoscopy procedure with Dr. Kandis Cocking it is safe ----- Message ----- From: Rushie Chestnut, [place] Sent: 04/17/2018   2:02 PM EDT To: Jules Husbands, MD Subject: Pt Endoscopy procedure with Dr. Vicente Males           Dr. Dahlia Byes,  Dr. Vicente Males ([facility]) plans to perform an EGD and Colonoscopy procedure on Wesley Bridges in the next 2-3 weeks. We see that pt is having a hernia repair procedure by you on 04-17-18, is it safe to proceed with the endoscopy procedure in 2-3 weeks or would you recommend allowing more time for recovery? Please advise.  Micael Hampshire., CMA

## 2018-04-18 NOTE — Telephone Encounter (Signed)
° °  [facility] Pre-operative Risk Assessment    Request for surgical clearance:  1. What type of surgery is being performed? EGD + colonoscopy  2. When is this surgery scheduled? 05/01/18  3. What type of clearance is required (medical clearance vs. Pharmacy clearance to hold med vs. Both)? medical  4. Are there any medications that need to be held prior to surgery and how long? Not noted   5. Practice name and name of physician performing surgery? [facility] Dr. Vicente Males   6. What is your office phone number [phone]   7.   What is your office fax number [phone]  8.   Anesthesia type (None, local, MAC, general) ?  Not noted    Clarisse Gouge 04/18/2018, 1:15 PM  _________________________________________________________________   (provider comments below)

## 2018-04-18 NOTE — Telephone Encounter (Signed)
Spoke with Wesley Bridges regarding endoscopy procedure date change due to Wesley Bridges recent hernia repair performed by Dr. Dahlia Byes. Dr. Dahlia Byes states it is safe for Wesley Bridges to have endoscopy procedure in 2-3 weeks. Wesley Bridges agrees to change procedure date to 05-01-18.

## 2018-04-18 NOTE — Addendum Note (Signed)
Addendum  created 04/18/18 1642 by Doreen Salvage, CRNA   Charge Capture section accepted

## 2018-04-19 NOTE — Telephone Encounter (Signed)
Patient is okay to proceed with EGD and colonoscopy without additional testing; these are low-risk procedures from a cardiac standpoint.  Aspirin can be held at the discretion of GI (patient was previously advised that he did not need to continue aspirin from a cardiac standpoint).  Nelva Bush, MD [facility] Pager: [phone]

## 2018-04-21 NOTE — Telephone Encounter (Signed)
Routed to number provided via EPIC fax.  

## 2018-04-23 ENCOUNTER — Other Ambulatory Visit: Payer: Self-pay | Admitting: Family Medicine

## 2018-04-25 ENCOUNTER — Telehealth: Payer: Self-pay | Admitting: Internal Medicine

## 2018-04-25 NOTE — Telephone Encounter (Signed)
Received records request [employer], forwarded to [facility] for processing.Faxed request and interoffice to [facility]

## 2018-04-29 ENCOUNTER — Telehealth: Payer: Self-pay

## 2018-04-29 NOTE — Telephone Encounter (Signed)
Patient contacted office to reschedule his colonoscopy/egd scheduled originally for 05/01/18 with Dr. Vicente Males.  No fee will be applied since 48 hour notice was given.  Patient has been rescheduled to 06/02/18 with Dr. Vicente Males.  Trish in Endoscopy notified.  Referral has been updated.  Thanks [employer]

## 2018-04-30 ENCOUNTER — Ambulatory Visit: Payer: Self-pay | Admitting: Surgery

## 2018-05-07 ENCOUNTER — Ambulatory Visit (INDEPENDENT_AMBULATORY_CARE_PROVIDER_SITE_OTHER): Payer: 59 | Admitting: Surgery

## 2018-05-07 ENCOUNTER — Other Ambulatory Visit: Payer: Self-pay

## 2018-05-07 ENCOUNTER — Encounter: Payer: Self-pay | Admitting: Surgery

## 2018-05-07 VITALS — BP 152/102 | HR 58 | Temp 97.9°F | Resp 12 | Ht 67.0 in | Wt 232.0 lb

## 2018-05-07 DIAGNOSIS — R202 Paresthesia of skin: Secondary | ICD-10-CM | POA: Diagnosis not present

## 2018-05-07 DIAGNOSIS — M25521 Pain in right elbow: Secondary | ICD-10-CM | POA: Diagnosis not present

## 2018-05-07 DIAGNOSIS — K4020 Bilateral inguinal hernia, without obstruction or gangrene, not specified as recurrent: Secondary | ICD-10-CM

## 2018-05-07 DIAGNOSIS — R200 Anesthesia of skin: Secondary | ICD-10-CM | POA: Diagnosis not present

## 2018-05-07 DIAGNOSIS — M7711 Lateral epicondylitis, right elbow: Secondary | ICD-10-CM | POA: Diagnosis not present

## 2018-05-07 DIAGNOSIS — M25421 Effusion, right elbow: Secondary | ICD-10-CM | POA: Diagnosis not present

## 2018-05-07 DIAGNOSIS — R51 Headache: Secondary | ICD-10-CM | POA: Diagnosis not present

## 2018-05-07 NOTE — Patient Instructions (Signed)
The patient is aware to call back for any questions or new concerns. Resume activities as tolerated. No heavy lifting for 3-4 weeks

## 2018-05-08 ENCOUNTER — Encounter: Payer: Self-pay | Admitting: Family Medicine

## 2018-05-08 ENCOUNTER — Encounter: Payer: Self-pay | Admitting: Surgery

## 2018-05-08 DIAGNOSIS — M2550 Pain in unspecified joint: Secondary | ICD-10-CM

## 2018-05-08 NOTE — Progress Notes (Signed)
S/p rob BIH Doing very well Minimal discomfort Taking PO No fevers  PE NAD Abd: soft, nt, no recurrence or infection  A/p Doing very well No heavy lifting  RTC prn

## 2018-05-26 ENCOUNTER — Telehealth: Payer: Self-pay | Admitting: Gastroenterology

## 2018-05-26 NOTE — Telephone Encounter (Signed)
Pt is calling he need rx for his procedure 12/09 please call in to [address]

## 2018-05-27 MED ORDER — NA SULFATE-K SULFATE-MG SULF 17.5-3.13-1.6 GM/177ML PO SOLN: 1.0000 | Freq: Once | ORAL | 0 refills | Status: AC

## 2018-05-27 NOTE — Telephone Encounter (Signed)
Spoke with pt regarding his request for the rx. Pt is requesting that we send a prescription for Suprep to [facility] on [address] in [place]. I explained that the prescription has been sent.

## 2018-05-27 NOTE — Addendum Note (Signed)
Addended by: Dorethea Clan on: 05/27/2018 09:58 AM   Modules accepted: Orders

## 2018-05-28 ENCOUNTER — Telehealth: Payer: Self-pay

## 2018-05-28 ENCOUNTER — Encounter: Payer: Self-pay | Admitting: Psychiatry

## 2018-05-28 ENCOUNTER — Ambulatory Visit: Payer: 59 | Admitting: Gastroenterology

## 2018-05-28 ENCOUNTER — Ambulatory Visit (INDEPENDENT_AMBULATORY_CARE_PROVIDER_SITE_OTHER): Payer: 59 | Admitting: Psychiatry

## 2018-05-28 VITALS — BP 150/98 | HR 88 | Ht 67.0 in | Wt 228.0 lb

## 2018-05-28 DIAGNOSIS — F321 Major depressive disorder, single episode, moderate: Secondary | ICD-10-CM

## 2018-05-28 DIAGNOSIS — F411 Generalized anxiety disorder: Secondary | ICD-10-CM

## 2018-05-28 MED ORDER — DULOXETINE HCL 30 MG PO CPEP: 30.0000 mg | ORAL_CAPSULE | Freq: Every day | ORAL | 0 refills | Status: DC

## 2018-05-28 MED ORDER — ESCITALOPRAM OXALATE 20 MG PO TABS: 10.0000 mg | ORAL_TABLET | ORAL | 0 refills | Status: DC

## 2018-05-28 NOTE — Telephone Encounter (Signed)
[facility]

## 2018-05-28 NOTE — Patient Instructions (Signed)

## 2018-05-28 NOTE — Progress Notes (Signed)
Psychiatric Initial Adult Assessment   Patient Identification: Wesley Bridges MRN:  [id number] Date of Evaluation:  05/28/2018 Referral Source: Wesley Bridges Chief Complaint:  ' I am here to establish care.' Chief Complaint    Depression; Establish Care     Visit Diagnosis:    ICD-10-CM   1. MDD (major depressive disorder), single episode, moderate (HCC) F32.1 DULoxetine (CYMBALTA) 30 MG capsule  2. GAD (generalized anxiety disorder) F41.1 DULoxetine (CYMBALTA) 30 MG capsule    History of Present Illness:  Wesley Bridges is a 46 year old male, lives in [place], currently on long-term disability, married, has a history of depression, anxiety, hypertension, hyperlipidemia, hernia, migraine, history of heart palpitations, right-sided ankle joint problems, presented to the clinic today to establish care.  Patient reports that 2 years ago he had an injury at work.  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  He reports he got his ankle joint injured at that time.  He reports ever since then he has been unable to work and has been probably having problems with his ankle.  He hence cannot stand for too long.  He reports he may need a fusion surgery soon.  He is currently on long-term disability.  He wants to apply for social security disability since he is unable to work.  He reports because of his physical limitations, health problems, financial constraints due to the same he started noticing depression and anxiety symptoms.  He reports his depressive symptoms as sadness, crying spells, feeling tired, low energy, poor appetite, feeling bad about himself, trouble concentrating, anhedonia and so on on a regular basis.  Patient reports he does have a history of not wanting to be here however denies any active suicidal thoughts or plans at this time.  Pt also reports some visual hallucinations of seeing ghosts.  He reports this happened a few months ago and it happened at least  twice.  He denies it now.  Patient reports generalized worry about everything.  He reports he is unable to stop worrying.  He has trouble relaxing.  He reports being restless.  He feels nervous and anxious and on edge on a regular basis.  Patient reports his depressive symptoms has been getting worse since the past few months.  His primary medical doctor tried several different medications including Zoloft, Wellbutrin.  He is currently on Lexapro.  He is on 20 mg of Lexapro and does not think the Lexapro is effective.  Patient also has headaches as well as numbness and tingling of his extremities.  He reports he has been seeing his neurologist Wesley Bridges for the same.  He is currently on Pamelor at bedtime for the same.  He reports the Pamelor helps him to sleep at night.  He however wakes up feeling tired.  He was able to complete Epworth sleep scale and scored very high on the same.  He reports that he can fall asleep sitting in a car, waiting in traffic , while watching TV and so on.He also snores.  Patient reports he is married.  His wife is employed and is supportive.  Associated Signs/Symptoms: Depression Symptoms:  depressed mood, anhedonia, fatigue, difficulty concentrating, anxiety, decreased appetite, (Hypo) Manic Symptoms:  denies Anxiety Symptoms:  Excessive Worry, Psychotic Symptoms:  Hallucinations: Visual per hx  PTSD Symptoms: Negative   Past Psychiatric History: Patient denies any inpatient mental health admissions.  Patient denies any suicide attempts.  Patient reports his mood symptoms where being managed by his primary  medical doctor.  Previous Psychotropic Medications: Yes  zoloft,wellbutrin,lexapro  Substance Abuse History in the last 12 months:  No.  Consequences of Substance Abuse: Negative  Past Medical History:  Past Medical History:  Diagnosis Date  . Anginal pain ([place])   . Arthritis   . Asthma   . Chronic headaches   . Depression   . Family  history of adverse reaction to anesthesia    son takes longer to wake up than a normal person  . GERD (gastroesophageal reflux disease)   . Hyperlipidemia   . Hypertension   . Subclinical hyperthyroidism     Past Surgical History:  Procedure Laterality Date  . ANKLE FRACTURE SURGERY    . ANKLE FUSION     [facility]  . CARDIAC CATHETERIZATION    . FEMUR FRACTURE SURGERY    . FRACTURE SURGERY    . LEFT HEART CATH AND CORONARY ANGIOGRAPHY N/A 01/08/2017   Procedure: Left Heart Cath and Coronary Angiography;  Surgeon: Nelva Bush, MD;  Location: [facility];  Service: Cardiovascular;  Laterality: N/A;  . MANDIBLE FRACTURE SURGERY    . ROBOT ASSISTED INGUINAL HERNIA REPAIR Bilateral 04/17/2018   Procedure: ROBOT ASSISTED INGUINAL HERNIA REPAIR;  Surgeon: Jules Husbands, MD;  Location: [facility];  Service: General;  Laterality: Bilateral;    Family Psychiatric History: Patient denies any history of mental health problems in his family.  Family History:  Family History  Problem Relation Age of Onset  . Arthritis Unknown        parents  . Hypertension Mother   . Thyroid disease Mother   . Osteoporosis Mother   . Syncope episode Mother   . Hypertension Brother     Social History:   Social History   Socioeconomic History  . Marital status: Married    Spouse name: Not on file  . Number of children: 6  . Years of education: Not on file  . Highest education level: High school graduate  Occupational History  . Not on file  Social Needs  . Financial resource strain: Not on file  . Food insecurity:    Worry: Often true    Inability: Often true  . Transportation needs:    Medical: No    Non-medical: No  Tobacco Use  . Smoking status: Never Smoker  . Smokeless tobacco: Never Used  Substance and Sexual Activity  . Alcohol use: Yes    Alcohol/week: 14.0 standard drinks    Types: 14 Cans of beer per week  . Drug use: No  . Sexual activity: Not on file  Lifestyle  .  Physical activity:    Days per week: 0 days    Minutes per session: 0 min  . Stress: To some extent  Relationships  . Social connections:    Talks on phone: Not on file    Gets together: Not on file    Attends religious service: Not on file    Active member of club or organization: Not on file    Attends meetings of clubs or organizations: Not on file    Relationship status: Not on file  Other Topics Concern  . Not on file  Social History Narrative   Lives at home with family. Independent at baseline.    Additional Social History: Patient is married.  He lives in [place] with his wife and children.  He has 6 children between the ages of 39-70-year-old.  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  Allergies:   Allergies  Allergen Reactions  . Bee Pollen Other (See Comments)  . Pollen Extract     Metabolic Disorder Labs: Lab Results  Component Value Date   HGBA1C 6.0 01/23/2017   No results found for: PROLACTIN Lab Results  Component Value Date   CHOL 178 04/02/2018   TRIG 78.0 04/02/2018   HDL 50.20 04/02/2018   CHOLHDL 4 04/02/2018   VLDL 15.6 04/02/2018   LDLCALC 112 (H) 04/02/2018   LDLCALC 90 09/14/2016   Lab Results  Component Value Date   TSH 3.57 12/23/2017    Therapeutic Level Labs: No results found for: LITHIUM No results found for: CBMZ No results found for: VALPROATE  Current Medications: Current Outpatient Medications  Medication Sig Dispense Refill  . aspirin EC 81 MG tablet Take 81 mg by mouth daily.    Marland Kitchen atorvastatin (LIPITOR) 40 MG tablet Take 1 tablet (40 mg total) by mouth daily. (Patient taking differently: Take 40 mg by mouth at bedtime. ) 90 tablet 1  . carvedilol (COREG) 6.25 MG tablet Take 6.25 mg by mouth 2 (two) times daily with a meal.   1  . dicyclomine (BENTYL) 10 MG capsule Take 1 capsule (10 mg total) by mouth 4 (four) times daily -  before meals and at  bedtime. 90 capsule 0  . escitalopram (LEXAPRO) 20 MG tablet Take 0.5 tablets (10 mg total) by mouth as directed. Take 10 mg every other day for 4 days and stop taking it 10 tablet 0  . fluticasone (FLONASE) 50 MCG/ACT nasal spray Place 2 sprays into both nostrils daily.   11  . lisinopril (PRINIVIL,ZESTRIL) 40 MG tablet Take 1 tablet (40 mg total) by mouth daily. 90 tablet 3  . loratadine (CLARITIN) 10 MG tablet Take 1 tablet (10 mg total) by mouth daily. (Patient taking differently: Take 20 mg by mouth daily. ) 30 tablet 11  . Multiple Vitamin (MULTIVITAMIN WITH MINERALS) TABS tablet Take 1 tablet by mouth at bedtime.    . nebivolol (BYSTOLIC) 10 MG tablet Take 1 tablet (10 mg total) by mouth daily. 90 tablet 1  . nitroGLYCERIN (NITROSTAT) 0.4 MG SL tablet DISSOLVE 1 TABLET UNDER THE TONGUE EVERY 5 MINUTES AS NEEDED FOR CHEST PAIN. MAXIMUM DAILY DOSE IS 3 TABLETS (Patient taking differently: Place 0.4 mg under the tongue every 5 (five) minutes as needed for chest pain. ) 25 tablet 0  . nortriptyline (PAMELOR) 10 MG capsule Take 10-20 mg by mouth See admin instructions. Take 10mg  by mouth for one week, then increase to 20mg  nightly    . pantoprazole (PROTONIX) 40 MG tablet TAKE 1 TABLET(40 MG) BY MOUTH AT BEDTIME 90 tablet 0  . amLODipine (NORVASC) 5 MG tablet Take 1 tablet (5 mg total) by mouth daily. 90 tablet 3  . DULoxetine (CYMBALTA) 30 MG capsule Take 1 capsule (30 mg total) by mouth daily with breakfast. For mood 30 capsule 0   No current facility-administered medications for this visit.     Musculoskeletal: Strength & Muscle Tone: within normal limits Gait & Station: walks with a cane Patient leans: N/A  Psychiatric Specialty Exam: Review of Systems  Psychiatric/Behavioral: Positive for depression. The patient is nervous/anxious.   All other systems reviewed and are negative.   Blood pressure (!) 150/98, pulse 88, height 5\' 7"  (1.702 m), weight 228 lb (103.4 kg), SpO2 99 %.Body  mass index is 35.71 kg/m.  General Appearance: Casual  Eye Contact:  Fair  Speech:  Clear and Coherent  Volume:  Normal  Mood:  Anxious and Depressed  Affect:  Congruent  Thought Process:  Goal Directed and Descriptions of Associations: Intact  Orientation:  Full (Time, Place, and Person)  Thought Content:  Logical  Suicidal Thoughts:  No  Homicidal Thoughts:  No  Memory:  Immediate;   Fair Recent;   Fair Remote;   Fair  Judgement:  Fair  Insight:  Fair  Psychomotor Activity:  Normal  Concentration:  Concentration: Fair and Attention Span: Fair  Recall:  [employer] of Knowledge:Fair  Language: Fair  Akathisia:  No  Handed:  Right  AIMS (if indicated): na  Assets:  Communication Skills Desire for Improvement Social Support  ADL's:  Intact  Cognition: WNL  Sleep:  Fair   Screenings: PHQ2-9     Office Visit from 02/01/2017 in [facility] Office Visit from 11/30/2016 in [facility]  PHQ-2 Total Score  6  0  PHQ-9 Total Score  21  -      Assessment and Plan: Pryor is a 47 year old male who has a history of depression, anxiety, multiple medical problems, presented to the clinic today to establish care.  Patient is biologically predisposed given his medical problems, being in pain.  He also has psychosocial stressors of being unable to work, financial stressors and so on.  Patient currently denies any active suicidal thoughts or plan.  Patient reports he is motivated to start medications as well as pursue psychotherapy.  He does have good social support system.  Plan as noted below.  Plan For MDD PHQ 9 equals 21 Taper of Lexapro.  Patient provided instructions to do so. Start Cymbalta 30 mg p.o. daily. Refer for CBT with therapist here in clinic.  For GAD GAD 7 equals 16 Cymbalta 30 mg p.o. daily Refer for CBT  Rule out sleep apnea Epworth sleep scale-18 Patient will be referred for sleep study today. He is on Pamelor 20 mg per  report initiated for headaches.  Discussed with him that Pamelor also helps with sleep.  I have reviewed TSH in [facility] R dated 12/23/2017-within normal limits.  I have reviewed notes in [facility] R Per Dr. Caryl Bis as well as [facility].   More than 50 % of the time was spent for psychoeducation and supportive psychotherapy and care coordination.  This note was generated in part or whole with voice recognition software. Voice recognition is usually quite accurate but there are transcription errors that can and very often do occur. I apologize for any typographical errors that were not detected and corrected.     Ursula Alert, MD 12/4/201910:03 AM

## 2018-06-02 ENCOUNTER — Encounter: Payer: Self-pay | Admitting: Certified Registered"

## 2018-06-02 ENCOUNTER — Encounter: Admission: RE | Payer: Self-pay | Source: Ambulatory Visit

## 2018-06-02 ENCOUNTER — Ambulatory Visit: Admission: RE | Admit: 2018-06-02 | Payer: 59 | Source: Ambulatory Visit | Admitting: Gastroenterology

## 2018-06-02 DIAGNOSIS — R1012 Left upper quadrant pain: Secondary | ICD-10-CM

## 2018-06-02 SURGERY — COLONOSCOPY WITH PROPOFOL
Anesthesia: General

## 2018-06-02 MED ORDER — LIDOCAINE HCL (PF) 2 % IJ SOLN
INTRAMUSCULAR | Status: AC
  Filled 2018-06-02: qty 10

## 2018-06-02 MED ORDER — GLYCOPYRROLATE 0.2 MG/ML IJ SOLN
INTRAMUSCULAR | Status: AC
  Filled 2018-06-02: qty 1

## 2018-06-02 MED ORDER — MIDAZOLAM HCL 2 MG/2ML IJ SOLN
INTRAMUSCULAR | Status: AC
  Filled 2018-06-02: qty 2

## 2018-06-02 MED ORDER — PROPOFOL 500 MG/50ML IV EMUL
INTRAVENOUS | Status: AC
  Filled 2018-06-02: qty 100

## 2018-06-03 ENCOUNTER — Ambulatory Visit: Payer: 59 | Admitting: Psychiatry

## 2018-06-03 DIAGNOSIS — R51 Headache: Secondary | ICD-10-CM | POA: Diagnosis not present

## 2018-06-03 DIAGNOSIS — R200 Anesthesia of skin: Secondary | ICD-10-CM | POA: Diagnosis not present

## 2018-06-03 DIAGNOSIS — R202 Paresthesia of skin: Secondary | ICD-10-CM | POA: Diagnosis not present

## 2018-06-04 ENCOUNTER — Encounter: Payer: Self-pay | Admitting: Family Medicine

## 2018-06-05 ENCOUNTER — Other Ambulatory Visit: Payer: Self-pay

## 2018-06-05 ENCOUNTER — Other Ambulatory Visit: Payer: Self-pay | Admitting: Sports Medicine

## 2018-06-05 DIAGNOSIS — M25521 Pain in right elbow: Secondary | ICD-10-CM

## 2018-06-05 DIAGNOSIS — M25421 Effusion, right elbow: Secondary | ICD-10-CM | POA: Diagnosis not present

## 2018-06-05 DIAGNOSIS — M7711 Lateral epicondylitis, right elbow: Secondary | ICD-10-CM

## 2018-06-05 DIAGNOSIS — M778 Other enthesopathies, not elsewhere classified: Secondary | ICD-10-CM

## 2018-06-05 MED ORDER — NITROGLYCERIN 0.4 MG SL SUBL: 0.4000 mg | SUBLINGUAL_TABLET | SUBLINGUAL | 0 refills | Status: DC | PRN

## 2018-06-13 ENCOUNTER — Ambulatory Visit (INDEPENDENT_AMBULATORY_CARE_PROVIDER_SITE_OTHER): Payer: 59 | Admitting: Licensed Clinical Social Worker

## 2018-06-13 DIAGNOSIS — F321 Major depressive disorder, single episode, moderate: Secondary | ICD-10-CM

## 2018-06-13 DIAGNOSIS — F411 Generalized anxiety disorder: Secondary | ICD-10-CM | POA: Diagnosis not present
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
## 2018-06-19 ENCOUNTER — Telehealth: Payer: Self-pay | Admitting: *Deleted

## 2018-06-19 NOTE — Telephone Encounter (Signed)
Ovid Curd with [facility] called the office today in regards to a medical records request that was sent on 06-12-18.  I did not see that we have sent these records.   Ovid Curd is going to refax the request. Our fax number was provided to her today.   Message sent to Georgina Peer and Raquel Sarna to follow up with this on Friday, 06-20-18.  Riley's direct number is [phone] ext. [phone] (extension is also the case number).

## 2018-06-19 NOTE — Telephone Encounter (Signed)
Records request was received this afternoon and I left on [facility] desk. Thanks.

## 2018-06-24 DIAGNOSIS — M2550 Pain in unspecified joint: Secondary | ICD-10-CM | POA: Diagnosis not present

## 2018-06-24 DIAGNOSIS — M79641 Pain in right hand: Secondary | ICD-10-CM | POA: Diagnosis not present

## 2018-06-24 DIAGNOSIS — M1990 Unspecified osteoarthritis, unspecified site: Secondary | ICD-10-CM | POA: Diagnosis not present

## 2018-06-27 ENCOUNTER — Other Ambulatory Visit: Payer: Self-pay | Admitting: Family Medicine

## 2018-06-30 ENCOUNTER — Ambulatory Visit: Payer: 59 | Admitting: Licensed Clinical Social Worker

## 2018-07-01 ENCOUNTER — Ambulatory Visit
Admission: RE | Admit: 2018-07-01 | Discharge: 2018-07-01 | Disposition: A | Discharge status: Home / Self Care | Payer: 59 | Source: Ambulatory Visit | Attending: Sports Medicine | Admitting: Sports Medicine

## 2018-07-01 DIAGNOSIS — S56511A Strain of other extensor muscle, fascia and tendon at forearm level, right arm, initial encounter: Secondary | ICD-10-CM | POA: Diagnosis not present

## 2018-07-01 DIAGNOSIS — M25421 Effusion, right elbow: Secondary | ICD-10-CM

## 2018-07-01 DIAGNOSIS — M778 Other enthesopathies, not elsewhere classified: Secondary | ICD-10-CM

## 2018-07-01 DIAGNOSIS — M25521 Pain in right elbow: Secondary | ICD-10-CM

## 2018-07-01 DIAGNOSIS — M7711 Lateral epicondylitis, right elbow: Secondary | ICD-10-CM

## 2018-07-02 ENCOUNTER — Other Ambulatory Visit: Payer: Self-pay | Admitting: Family Medicine

## 2018-07-02 MED ORDER — PANTOPRAZOLE SODIUM 40 MG PO TBEC: DELAYED_RELEASE_TABLET | ORAL | 0 refills | Status: DC

## 2018-07-02 NOTE — Telephone Encounter (Signed)
Change in pharmacy Requested Prescriptions  Pending Prescriptions Disp Refills  . pantoprazole (PROTONIX) 40 MG tablet 90 tablet 0    Sig: TAKE 1 TABLET(40 MG) BY MOUTH AT BEDTIME     Gastroenterology: Proton Pump Inhibitors Passed - 07/02/2018 12:02 PM      Passed - Valid encounter within last 12 months    Recent Outpatient Visits          3 months ago Elevated urine levels of catecholamines   [facility] Leone Haven, MD   6 months ago Syncope, unspecified syncope type   [facility] Leone Haven, MD   9 months ago Acute bronchitis, unspecified organism   [facility] [place], [place], [place]   11 months ago Essential hypertension   [place], Angela Adam, MD   1 year ago Low TSH level   [facility] Leone Haven, MD      Future Appointments            In 2 weeks Caryl Bis, Angela Adam, MD [facility], [facility]

## 2018-07-02 NOTE — Telephone Encounter (Signed)
Copied from [place] [id number]. Topic: Quick Communication - Rx Refill/Question >> Jul 02, 2018 11:53 AM Carolyn Stare wrote: Medication  pantoprazole (PROTONIX) 40 MG          Has the patient contacted their pharmacy yes    Preferred [facility]   Agent: Please be advised that RX refills may take up to 3 business days. We ask that you follow-up with your pharmacy.

## 2018-07-10 DIAGNOSIS — M2550 Pain in unspecified joint: Secondary | ICD-10-CM | POA: Diagnosis not present

## 2018-07-10 DIAGNOSIS — Z79899 Other long term (current) drug therapy: Secondary | ICD-10-CM | POA: Insufficient documentation

## 2018-07-10 DIAGNOSIS — M25521 Pain in right elbow: Secondary | ICD-10-CM | POA: Diagnosis not present

## 2018-07-14 DIAGNOSIS — M7711 Lateral epicondylitis, right elbow: Secondary | ICD-10-CM | POA: Insufficient documentation

## 2018-07-15 ENCOUNTER — Ambulatory Visit: Payer: 59 | Admitting: Licensed Clinical Social Worker

## 2018-07-21 ENCOUNTER — Encounter: Payer: Self-pay | Admitting: Family Medicine

## 2018-07-21 DIAGNOSIS — R51 Headache: Secondary | ICD-10-CM | POA: Diagnosis not present

## 2018-07-21 DIAGNOSIS — R202 Paresthesia of skin: Secondary | ICD-10-CM | POA: Diagnosis not present

## 2018-07-21 DIAGNOSIS — R200 Anesthesia of skin: Secondary | ICD-10-CM | POA: Diagnosis not present

## 2018-07-21 DIAGNOSIS — G4733 Obstructive sleep apnea (adult) (pediatric): Secondary | ICD-10-CM | POA: Diagnosis not present

## 2018-07-22 ENCOUNTER — Encounter: Payer: Self-pay | Admitting: Family Medicine

## 2018-07-22 ENCOUNTER — Ambulatory Visit (INDEPENDENT_AMBULATORY_CARE_PROVIDER_SITE_OTHER): Payer: 59 | Admitting: Family Medicine

## 2018-07-22 VITALS — BP 122/86 | HR 73 | Temp 98.0°F | Ht 67.0 in | Wt 229.0 lb

## 2018-07-22 DIAGNOSIS — G4710 Hypersomnia, unspecified: Secondary | ICD-10-CM | POA: Insufficient documentation

## 2018-07-22 DIAGNOSIS — R079 Chest pain, unspecified: Secondary | ICD-10-CM | POA: Diagnosis not present

## 2018-07-22 DIAGNOSIS — F322 Major depressive disorder, single episode, severe without psychotic features: Secondary | ICD-10-CM

## 2018-07-22 DIAGNOSIS — L749 Eccrine sweat disorder, unspecified: Secondary | ICD-10-CM | POA: Diagnosis not present

## 2018-07-22 DIAGNOSIS — M24671 Ankylosis, right ankle: Secondary | ICD-10-CM

## 2018-07-22 DIAGNOSIS — I10 Essential (primary) hypertension: Secondary | ICD-10-CM

## 2018-07-22 DIAGNOSIS — R825 Elevated urine levels of drugs, medicaments and biological substances: Secondary | ICD-10-CM

## 2018-07-22 DIAGNOSIS — M25521 Pain in right elbow: Secondary | ICD-10-CM

## 2018-07-22 DIAGNOSIS — M1990 Unspecified osteoarthritis, unspecified site: Secondary | ICD-10-CM

## 2018-07-22 LAB — TSH: TSH: 1.54 u[IU]/mL (ref 0.35–4.50)

## 2018-07-22 NOTE — Assessment & Plan Note (Signed)
I suspect this is related to sleep apnea.  He will complete his sleep study.

## 2018-07-22 NOTE — Assessment & Plan Note (Signed)
Patient underwent evaluation with endocrinology.  He had negative serum metanephrines.  No further work-up needed.

## 2018-07-22 NOTE — Assessment & Plan Note (Signed)
He will continue to see rheumatology. 

## 2018-07-22 NOTE — Assessment & Plan Note (Signed)
He will continue to follow with orthopedics. 

## 2018-07-22 NOTE — Progress Notes (Signed)
[name], MD Phone: [phone]  Wesley Bridges is a 48 y.o. male who presents today for follow-up.  CC: Arthritis, right elbow pain, hypersomnia, hypertension, sweating, depression  Arthritis: Patient saw rheumatology.  Evaluation was negative for inflammatory arthritis.  He notes they diagnosed him with osteoarthritis.  He was given a prednisone taper which was not beneficial.  He notes he has been on Plaquenil though has had no benefit.  His hands are quite painful particularly when he does things.  His right ankle continues to bother him and he did see orthopedics in the past who recommended possible fusion though the patient has declined this.  He continues to have pain in that ankle.  Right elbow pain: He has seen orthopedics.  He had an MRI that revealed a partial-thickness tear of the origin of the common extensor tendon at the lateral epicondyle.  He also had arthritis at the articulation of the radial head with the capitellum with secondary joint effusion.  He is going to have surgery on this in the near future.  Hypersomnia: Noted by his psychiatrist.  He does snore and reports apneic episodes.  He falls asleep easily during the day.  He started to sleep study last night at home.  He notes he has had symptoms for 2 to 3 years.  Hypertension: Typically running around 130s/90s.  Occasionally lower.  He is currently taking amlodipine, lisinopril, Bystolic, and carvedilol.  He notes chronic atypical chest pain that is sharp and stabbing and pinpoint in his lower left costochondral joints.  He notes there has been no change to this.  Occurs a couple times a month.  No dyspnea.  He has been following with cardiology for this.  Had a work-up for secondary hypertension that was negative.  He does report his cardiologist advised him not to drive given his prior syncopal episodes and intermittent dizziness.  Sweating: Patient reports for the last 6 or so months he has had increased  sweating with activity.  Notes no sweating when he is not having increased activity.  Starts after about 10 minutes.  He has no associated chest pain or shortness of breath with this.  Depression: He notes this is about the same.  He was on Cymbalta though this was discontinued by neurology and he was placed on venlafaxine.  Also on nortriptyline.  He notes when that wears off he will have a hangover type headache.  He follows up with psychiatry in February.  He notes no suicidal thoughts though does have thoughts that he would be better off dead.  Social History   Tobacco Use  Smoking Status Never Smoker  Smokeless Tobacco Never Used     ROS see history of present illness  Objective  Physical Exam Vitals:   07/22/18 0920  BP: 122/86  Pulse: 73  Temp: 98 F (36.7 C)  SpO2: 97%    BP Readings from Last 3 Encounters:  07/22/18 122/86  05/28/18 (!) 150/98  05/07/18 (!) 152/102   Wt Readings from Last 3 Encounters:  07/22/18 229 lb (103.9 kg)  05/28/18 228 lb (103.4 kg)  05/07/18 232 lb (105.2 kg)    Physical Exam Constitutional:      General: He is not in acute distress.    Appearance: He is not diaphoretic.  Cardiovascular:     Rate and Rhythm: Normal rate and regular rhythm.     Heart sounds: Normal heart sounds.  Pulmonary:     Effort: Pulmonary effort is normal.  Breath sounds: Normal breath sounds.  Musculoskeletal:     Right lower leg: No edema.     Left lower leg: No edema.     Comments: Tender to palpation over right elbow lateral epicondyle, no significant swelling of the right elbow  Skin:    General: Skin is warm and dry.  Neurological:     Mental Status: He is alert.      Assessment/Plan: Please see individual problem list.  Essential hypertension Adequately controlled today though slightly above goal at home.  Discussed that his likely sleep apnea could be contributing to this and his blood pressure may benefit from being on CPAP if he is  diagnosed with sleep apnea.  We will discontinue his carvedilol.  Discussed that he should not be taking 2 beta-blockers.  He will continue amlodipine, lisinopril, and Bystolic.  Fibrosis of subtalar joint, right He continues to have pain in this area.  He reports his disability for this issue is coming to an end.  Given his chronic pain in his hands that worsens with use of his hands and his prior issues with syncope and continued intermittent dizziness it may be reasonable for him to stay out of work at this time.  Chest pain Suspect musculoskeletal chest pain given description.  No indication of cardiac chest pain.  He will continue to monitor.  Elevated urine levels of catecholamines Patient underwent evaluation with endocrinology.  He had negative serum metanephrines.  No further work-up needed.  Major depression, single episode Patient continues to have issues with this.  He will continue to see psychiatry.  Given return precautions.  Right elbow pain He will continue to follow with orthopedics.  Arthritis He will continue to see rheumatology.  Sweating abnormality This has been going on for at least 6 months.  He had an EKG at his last cardiology visit that appears normal.  We will check a TSH.  If his TSH is normal we will contact his cardiologist to get their input.  Hypersomnia I suspect this is related to sleep apnea.  He will complete his sleep study.   Orders Placed This Encounter  Procedures  . TSH    No orders of the defined types were placed in this encounter.    [name], MD [place]

## 2018-07-22 NOTE — Assessment & Plan Note (Signed)
Suspect musculoskeletal chest pain given description.  No indication of cardiac chest pain.  He will continue to monitor.

## 2018-07-22 NOTE — Assessment & Plan Note (Signed)
Patient continues to have issues with this.  He will continue to see psychiatry.  Given return precautions.

## 2018-07-22 NOTE — Patient Instructions (Addendum)
Nice to see you. We are going to check your thyroid function today. Please discuss your nortriptyline with your psychiatrist when you see her again. Please complete the sleep study. Please discontinue the carvedilol.  You should only be taking Bystolic, amlodipine, and lisinopril for your blood pressure.

## 2018-07-22 NOTE — Assessment & Plan Note (Signed)
This has been going on for at least 6 months.  He had an EKG at his last cardiology visit that appears normal.  We will check a TSH.  If his TSH is normal we will contact his cardiologist to get their input.

## 2018-07-22 NOTE — Assessment & Plan Note (Signed)
Adequately controlled today though slightly above goal at home.  Discussed that his likely sleep apnea could be contributing to this and his blood pressure may benefit from being on CPAP if he is diagnosed with sleep apnea.  We will discontinue his carvedilol.  Discussed that he should not be taking 2 beta-blockers.  He will continue amlodipine, lisinopril, and Bystolic.

## 2018-07-22 NOTE — Assessment & Plan Note (Signed)
He continues to have pain in this area.  He reports his disability for this issue is coming to an end.  Given his chronic pain in his hands that worsens with use of his hands and his prior issues with syncope and continued intermittent dizziness it may be reasonable for him to stay out of work at this time.

## 2018-07-30 ENCOUNTER — Ambulatory Visit
Admission: RE | Admit: 2018-07-30 | Discharge: 2018-07-30 | Disposition: A | Discharge status: Home / Self Care | Payer: 59 | Attending: Surgery | Admitting: Surgery

## 2018-07-30 ENCOUNTER — Encounter
Admission: RE | Disposition: A | Discharge status: Home / Self Care | Payer: Self-pay | Source: Home / Self Care | Attending: Surgery

## 2018-07-30 ENCOUNTER — Ambulatory Visit: Payer: 59 | Admitting: Anesthesiology

## 2018-07-30 DIAGNOSIS — E785 Hyperlipidemia, unspecified: Secondary | ICD-10-CM | POA: Insufficient documentation

## 2018-07-30 DIAGNOSIS — Z7982 Long term (current) use of aspirin: Secondary | ICD-10-CM | POA: Insufficient documentation

## 2018-07-30 DIAGNOSIS — G4733 Obstructive sleep apnea (adult) (pediatric): Secondary | ICD-10-CM | POA: Diagnosis not present

## 2018-07-30 DIAGNOSIS — K219 Gastro-esophageal reflux disease without esophagitis: Secondary | ICD-10-CM | POA: Diagnosis not present

## 2018-07-30 DIAGNOSIS — I10 Essential (primary) hypertension: Secondary | ICD-10-CM | POA: Insufficient documentation

## 2018-07-30 DIAGNOSIS — M7711 Lateral epicondylitis, right elbow: Secondary | ICD-10-CM | POA: Diagnosis present

## 2018-07-30 DIAGNOSIS — F329 Major depressive disorder, single episode, unspecified: Secondary | ICD-10-CM | POA: Insufficient documentation

## 2018-07-30 DIAGNOSIS — Z79899 Other long term (current) drug therapy: Secondary | ICD-10-CM | POA: Diagnosis not present

## 2018-07-30 DIAGNOSIS — J45909 Unspecified asthma, uncomplicated: Secondary | ICD-10-CM | POA: Diagnosis not present

## 2018-07-30 DIAGNOSIS — Z7951 Long term (current) use of inhaled steroids: Secondary | ICD-10-CM | POA: Diagnosis not present

## 2018-07-30 HISTORY — DX: Adverse effect of unspecified anesthetic, initial encounter: T41.45XA

## 2018-07-30 HISTORY — PX: I & D EXTREMITY: SHX5045

## 2018-07-30 HISTORY — DX: Other complications of anesthesia, initial encounter: T88.59XA

## 2018-07-30 SURGERY — IRRIGATION AND DEBRIDEMENT EXTREMITY
Anesthesia: General | Site: Elbow | Laterality: Right

## 2018-07-30 MED ORDER — FENTANYL CITRATE (PF) 100 MCG/2ML IJ SOLN
INTRAMUSCULAR | Status: DC | PRN
  Administered 2018-07-30: 25 ug via INTRAVENOUS

## 2018-07-30 MED ORDER — DEXTROSE 5 % IV SOLN
2000.0000 mg | Freq: Once | INTRAVENOUS | Status: AC
  Administered 2018-07-30: 2000 mg via INTRAVENOUS

## 2018-07-30 MED ORDER — BUPIVACAINE HCL (PF) 0.5 % IJ SOLN
INTRAMUSCULAR | Status: DC | PRN
  Administered 2018-07-30: 10 mL

## 2018-07-30 MED ORDER — HYDROCODONE-ACETAMINOPHEN 5-325 MG PO TABS: 1.0000 | ORAL_TABLET | Freq: Four times a day (QID) | ORAL | 0 refills | Status: DC | PRN

## 2018-07-30 MED ORDER — LACTATED RINGERS IV SOLN
INTRAVENOUS | Status: DC
  Administered 2018-07-30: 12:00:00 via INTRAVENOUS

## 2018-07-30 MED ORDER — MIDAZOLAM HCL 5 MG/5ML IJ SOLN
INTRAMUSCULAR | Status: DC | PRN
  Administered 2018-07-30: 2 mg via INTRAVENOUS

## 2018-07-30 MED ORDER — OXYCODONE HCL 5 MG/5ML PO SOLN: 5.0000 mg | Freq: Once | ORAL | Status: AC | PRN

## 2018-07-30 MED ORDER — OXYCODONE HCL 5 MG PO TABS
5.0000 mg | ORAL_TABLET | Freq: Once | ORAL | Status: AC | PRN
  Administered 2018-07-30: 5 mg via ORAL

## 2018-07-30 MED ORDER — LIDOCAINE HCL (CARDIAC) PF 100 MG/5ML IV SOSY
PREFILLED_SYRINGE | INTRAVENOUS | Status: DC | PRN
  Administered 2018-07-30: 40 mg via INTRATRACHEAL

## 2018-07-30 MED ORDER — ONDANSETRON HCL 4 MG/2ML IJ SOLN
INTRAMUSCULAR | Status: DC | PRN
  Administered 2018-07-30: 4 mg via INTRAVENOUS

## 2018-07-30 MED ORDER — EPHEDRINE SULFATE 50 MG/ML IJ SOLN
INTRAMUSCULAR | Status: DC | PRN
  Administered 2018-07-30 (×3): 10 mg via INTRAVENOUS

## 2018-07-30 MED ORDER — DEXAMETHASONE SODIUM PHOSPHATE 4 MG/ML IJ SOLN
INTRAMUSCULAR | Status: DC | PRN
  Administered 2018-07-30: 4 mg via INTRAVENOUS

## 2018-07-30 MED ORDER — PROPOFOL 10 MG/ML IV BOLUS
INTRAVENOUS | Status: DC | PRN
  Administered 2018-07-30: 300 mg via INTRAVENOUS

## 2018-07-30 MED ORDER — HYDROMORPHONE HCL 1 MG/ML IJ SOLN: 0.2500 mg | INTRAMUSCULAR | Status: DC | PRN

## 2018-07-30 SURGICAL SUPPLY — 31 items
ANCHOR SUT 1.45 SZ 1 SHORT (Anchor) ×4 IMPLANT
BANDAGE ELASTIC 4 LF NS (GAUZE/BANDAGES/DRESSINGS) ×2 IMPLANT
BENZOIN TINCTURE PRP APPL 2/3 (GAUZE/BANDAGES/DRESSINGS) ×2 IMPLANT
BNDG COHESIVE 4X5 TAN STRL (GAUZE/BANDAGES/DRESSINGS) ×2 IMPLANT
BNDG ESMARK 4X12 TAN STRL LF (GAUZE/BANDAGES/DRESSINGS) ×2 IMPLANT
CANISTER SUCT 1200ML W/VALVE (MISCELLANEOUS) ×2 IMPLANT
CHLORAPREP W/TINT 26ML (MISCELLANEOUS) ×2 IMPLANT
COVER LIGHT HANDLE UNIVERSAL (MISCELLANEOUS) ×4 IMPLANT
CUFF TOURN SGL QUICK 18X4 (TOURNIQUET CUFF) ×2 IMPLANT
DRAPE U-SHAPE 48X52 POLY STRL (PACKS) ×2 IMPLANT
ELECT REM PT RETURN 9FT ADLT (ELECTROSURGICAL) ×2
ELECTRODE REM PT RTRN 9FT ADLT (ELECTROSURGICAL) ×1 IMPLANT
GAUZE PETRO XEROFOAM 1X8 (MISCELLANEOUS) ×2 IMPLANT
GAUZE SPONGE 4X4 12PLY STRL (GAUZE/BANDAGES/DRESSINGS) ×4 IMPLANT
GLOVE BIO SURGEON STRL SZ7.5 (GLOVE) ×2 IMPLANT
GLOVE INDICATOR 8.0 STRL GRN (GLOVE) ×2 IMPLANT
GOWN STRL REUS W/ TWL LRG LVL3 (GOWN DISPOSABLE) ×1 IMPLANT
GOWN STRL REUS W/ TWL XL LVL3 (GOWN DISPOSABLE) ×1 IMPLANT
GOWN STRL REUS W/TWL LRG LVL3 (GOWN DISPOSABLE) ×1
GOWN STRL REUS W/TWL XL LVL3 (GOWN DISPOSABLE) ×1
KIT TURNOVER KIT A (KITS) ×2 IMPLANT
NS IRRIG 500ML POUR BTL (IV SOLUTION) ×2 IMPLANT
PACK EXTREMITY ARMC (MISCELLANEOUS) ×2 IMPLANT
SLING ARM LRG DEEP (SOFTGOODS) ×2 IMPLANT
SPLINT WRIST/FOREARM XL RT TX9 (SOFTGOODS) ×2 IMPLANT
STOCKINETTE IMPERVIOUS 9X36 MD (GAUZE/BANDAGES/DRESSINGS) ×2 IMPLANT
STRIP CLOSURE SKIN 1/4X4 (GAUZE/BANDAGES/DRESSINGS) ×2 IMPLANT
SUT VIC AB 2-0 CT1 27 (SUTURE) ×1
SUT VIC AB 2-0 CT1 TAPERPNT 27 (SUTURE) ×1 IMPLANT
SUT VIC AB 3-0 SH 27 (SUTURE) ×1
SUT VIC AB 3-0 SH 27X BRD (SUTURE) ×1 IMPLANT

## 2018-07-30 NOTE — Anesthesia Postprocedure Evaluation (Signed)
Anesthesia Post Note  Patient: Wesley Bridges  Procedure(s) Performed: DEBRIDEMENT OF THE COMMON EXTENSOR ORIGIN OF ELBOW (Right Elbow)  Patient location during evaluation: PACU Anesthesia Type: General Level of consciousness: awake and alert Pain management: pain level controlled Vital Signs Assessment: post-procedure vital signs reviewed and stable Respiratory status: spontaneous breathing Cardiovascular status: stable Anesthetic complications: no    Lochlann Mastrangelo, III,  Alphia Behanna D

## 2018-07-30 NOTE — Anesthesia Procedure Notes (Signed)
Procedure Name: LMA Insertion Date/Time: 07/30/2018 1:08 PM Performed by: Janna Arch, CRNA Pre-anesthesia Checklist: Patient identified, Emergency Drugs available, Suction available, Timeout performed and Patient being monitored Patient Re-evaluated:Patient Re-evaluated prior to induction Oxygen Delivery Method: Circle system utilized Preoxygenation: Pre-oxygenation with 100% oxygen Induction Type: IV induction LMA: LMA inserted LMA Size: 4.0 Number of attempts: 1 Placement Confirmation: positive ETCO2 and breath sounds checked- equal and bilateral Tube secured with: Tape Dental Injury: Teeth and Oropharynx as per pre-operative assessment

## 2018-07-30 NOTE — Op Note (Signed)
07/30/2018  2:02 PM  Patient:   Wesley Bridges  Pre-Op Diagnosis:   Chronic lateral epicondylitis, right elbow.  Post-Op Diagnosis:   Same.  Procedure:   Debridement/repair of common extensor origin, right elbow.  Surgeon:   Pascal Lux, MD  Assistant:   None  Anesthesia:   General LMA  Findings:   As above.  Complications:   None  EBL:   0 cc  Fluids:   850 cc crystalloid  TT:   31 minutes at 250 mmHg  Drains:   None  Closure:   3-0 Vicryl subcuticular sutures  Implants:   Biomet JuggerKnot 1.4 mm anchor x2  Brief Clinical Note:   The patient is a 48 year old male with a history of progressively worsening lateral sided right elbow pain. His symptoms have persisted despite medications, activity modification, injections, etc. His history and examination assistant with chronic lateral epicondylitis, confirmed by MRI scan. The MRI scan also demonstrated partial tearing of the common extensor origin from the lateral epicondyles. The patient presents at this time for debridement and repair of the common extensor origin of his left elbow.  Procedure:   The patient was brought into the operating room and lain in the supine position. After adequate general laryngal mask anesthesia was obtained, the patient's right upper extremity was prepped with ChloraPrep solution before being draped sterilely. Preoperative antibiotics were administered. A timeout was performed to verify the appropriate surgical site before the limb was exsanguinated with an Esmarch and the tourniquet inflated to 250 mmHg. An approximately 4-5 cm incision was made over the lateral aspect of the elbow beginning at the lateral epicondyle and extending distally in line with the common extensor origin tendons. The incision was carried down through the subcutaneous tissues to expose the common extensor origin. The extensor carpi radialis brevis tendon was identified and incised in line with the incision. The areas  of significantly degenerative tendinous tissues were debrided sharply with a #15 blade down to the epicondylar region. The bone itself was roughened with a rongeur to provide a good bleeding surface for reattachment of the tendon. Two Biomet 1.4 mm JuggerKnot anchors were inserted into the exposed bone of the lateral epicondyle. Each set of sutures was passed through the tendon and tied securely to effect the repair.   The wound was copiously irrigated with bacitracin saline solution using bulb irrigation before several 2-0 Vicryl interrupted sutures were used to close the longitudinal incision in the tendon in a side-to-side fashion. The subcutaneous tissues were closed using 2-0 Vicryl interrupted sutures before the skin was closed using 3-0 Vicryl subcuticular sutures. Benzoin and Steri-Strips were applied to the skin. A total of 10 cc of 0.5% plain Sensorcaine was injected in and around the incision to help with postoperative analgesia before a sterile bulky dressing was applied to the elbow. A Velcro wrist immobilizer was applied before the patient was awakened, extubated, and returned to the recovery room in satisfactory condition after tolerating the procedure well.

## 2018-07-30 NOTE — H&P (Signed)
Paper H&P to be scanned into permanent record. H&P reviewed and patient re-examined. No changes. 

## 2018-07-30 NOTE — Transfer of Care (Signed)
Immediate Anesthesia Transfer of Care Note  Patient: Wesley Bridges  Procedure(s) Performed: DEBRIDEMENT OF THE COMMON EXTENSOR ORIGIN OF ELBOW (Right Elbow)  Patient Location: PACU  Anesthesia Type: General LMA  Level of Consciousness: awake, alert  and patient cooperative  Airway and Oxygen Therapy: Patient Spontanous Breathing and Patient connected to supplemental oxygen  Post-op Assessment: Post-op Vital signs reviewed, Patient's Cardiovascular Status Stable, Respiratory Function Stable, Patent Airway and No signs of Nausea or vomiting  Post-op Vital Signs: Reviewed and stable  Complications: No apparent anesthesia complications

## 2018-07-30 NOTE — Discharge Instructions (Signed)
General Anesthesia, Adult, Care After °This sheet gives you information about how to care for yourself after your procedure. Your health care provider may also give you more specific instructions. If you have problems or questions, contact your health care provider. °What can I expect after the procedure? °After the procedure, the following side effects are common: °· Pain or discomfort at the IV site. °· Nausea. °· Vomiting. °· Sore throat. °· Trouble concentrating. °· Feeling cold or chills. °· Weak or tired. °· Sleepiness and fatigue. °· Soreness and body aches. These side effects can affect parts of the body that were not involved in surgery. °Follow these instructions at home: ° °For at least 24 hours after the procedure: °· Have a responsible adult stay with you. It is important to have someone help care for you until you are awake and alert. °· Rest as needed. °· Do not: °? Participate in activities in which you could fall or become injured. °? Drive. °? Use heavy machinery. °? Drink alcohol. °? Take sleeping pills or medicines that cause drowsiness. °? Make important decisions or sign legal documents. °? Take care of children on your own. °Eating and drinking °· Follow any instructions from your health care provider about eating or drinking restrictions. °· When you feel hungry, start by eating small amounts of foods that are soft and easy to digest (bland), such as toast. Gradually return to your regular diet. °· Drink enough fluid to keep your urine pale yellow. °· If you vomit, rehydrate by drinking water, juice, or clear broth. °General instructions °· If you have sleep apnea, surgery and certain medicines can increase your risk for breathing problems. Follow instructions from your health care provider about wearing your sleep device: °? Anytime you are sleeping, including during daytime naps. °? While taking prescription pain medicines, sleeping medicines, or medicines that make you drowsy. °· Return to  your normal activities as told by your health care provider. Ask your health care provider what activities are safe for you. °· Take over-the-counter and prescription medicines only as told by your health care provider. °· If you smoke, do not smoke without supervision. °· Keep all follow-up visits as told by your health care provider. This is important. °Contact a health care provider if: °· You have nausea or vomiting that does not get better with medicine. °· You cannot eat or drink without vomiting. °· You have pain that does not get better with medicine. °· You are unable to pass urine. °· You develop a skin rash. °· You have a fever. °· You have redness around your IV site that gets worse. °Get help right away if: °· You have difficulty breathing. °· You have chest pain. °· You have blood in your urine or stool, or you vomit blood. °Summary °· After the procedure, it is common to have a sore throat or nausea. It is also common to feel tired. °· Have a responsible adult stay with you for the first 24 hours after general anesthesia. It is important to have someone help care for you until you are awake and alert. °· When you feel hungry, start by eating small amounts of foods that are soft and easy to digest (bland), such as toast. Gradually return to your regular diet. °· Drink enough fluid to keep your urine pale yellow. °· Return to your normal activities as told by your health care provider. Ask your health care provider what activities are safe for you. °This information is not   intended to replace advice given to you by your health care provider. Make sure you discuss any questions you have with your health care provider. Document Released: 09/17/2000 Document Revised: 01/25/2017 Document Reviewed: 01/25/2017 Elsevier Interactive Patient Education  2019 [place] discharge instructions: Keep dressing dry and intact. Keep arm and hand elevated above heart level. May shower after  dressing removed on postop day 4 (Sunday). Reapply wrist splint after showering. Cover staples/sutures with Band-Aids or Ace wrap after drying off. Apply ice to affected area frequently. Take ibuprofen 800 mg TID with meals for 7-10 days, then as necessary. Take ES Tylenol or pain medication as prescribed when needed.  Return for follow-up in 10-14 days or as scheduled.

## 2018-07-30 NOTE — Anesthesia Preprocedure Evaluation (Signed)
Anesthesia Evaluation  Patient identified by MRN, date of birth, ID band Patient awake    Reviewed: Allergy & Precautions, H&P , NPO status , Patient's Chart, lab work & pertinent test results  Airway Mallampati: II  TM Distance: >3 FB Neck ROM: full    Dental no notable dental hx.    Pulmonary asthma ,  OSA just started on cpap.   Pulmonary exam normal breath sounds clear to auscultation       Cardiovascular hypertension, On Medications Normal cardiovascular exam Rhythm:regular Rate:Normal     Neuro/Psych  Headaches,    GI/Hepatic Neg liver ROS, Medicated,  Endo/Other    Renal/GU negative Renal ROS     Musculoskeletal   Abdominal   Peds  Hematology negative hematology ROS (+)   Anesthesia Other Findings   Reproductive/Obstetrics                             Anesthesia Physical Anesthesia Plan  ASA: II  Anesthesia Plan: General LMA   Post-op Pain Management:    Induction:   PONV Risk Score and Plan:   Airway Management Planned:   Additional Equipment:   Intra-op Plan:   Post-operative Plan:   Informed Consent: I have reviewed the patients History and Physical, chart, labs and discussed the procedure including the risks, benefits and alternatives for the proposed anesthesia with the patient or authorized representative who has indicated his/her understanding and acceptance.       Plan Discussed with:   Anesthesia Plan Comments:         Anesthesia Quick Evaluation

## 2018-08-14 ENCOUNTER — Other Ambulatory Visit: Payer: Self-pay | Admitting: Family Medicine

## 2018-08-19 ENCOUNTER — Telehealth: Payer: Self-pay

## 2018-08-19 MED ORDER — ATORVASTATIN CALCIUM 40 MG PO TABS: 40.0000 mg | ORAL_TABLET | Freq: Every day | ORAL | 3 refills | Status: DC

## 2018-08-19 MED ORDER — NEBIVOLOL HCL 10 MG PO TABS: 10.0000 mg | ORAL_TABLET | Freq: Every day | ORAL | 1 refills | Status: DC

## 2018-08-19 MED ORDER — PANTOPRAZOLE SODIUM 40 MG PO TBEC: DELAYED_RELEASE_TABLET | ORAL | 1 refills | Status: DC

## 2018-08-19 MED ORDER — LISINOPRIL 40 MG PO TABS: 40.0000 mg | ORAL_TABLET | Freq: Every day | ORAL | 3 refills | Status: DC

## 2018-08-26 ENCOUNTER — Encounter: Payer: Self-pay | Admitting: Family Medicine

## 2018-08-26 NOTE — Telephone Encounter (Signed)
Would you be the one to help me with getting this information Melissa?

## 2018-10-15 DIAGNOSIS — M7711 Lateral epicondylitis, right elbow: Secondary | ICD-10-CM | POA: Diagnosis not present

## 2018-10-15 DIAGNOSIS — M25521 Pain in right elbow: Secondary | ICD-10-CM | POA: Diagnosis not present

## 2018-10-23 DIAGNOSIS — M2241 Chondromalacia patellae, right knee: Secondary | ICD-10-CM | POA: Insufficient documentation

## 2018-10-23 DIAGNOSIS — M2242 Chondromalacia patellae, left knee: Secondary | ICD-10-CM | POA: Diagnosis not present

## 2018-10-23 DIAGNOSIS — M25561 Pain in right knee: Secondary | ICD-10-CM | POA: Diagnosis not present

## 2018-10-27 ENCOUNTER — Encounter: Payer: Self-pay | Admitting: Family Medicine

## 2018-10-27 NOTE — Telephone Encounter (Signed)
I would suggest completing a virtual visit to help determine appropriate treatment. Please contact him to get this set up.

## 2018-10-27 NOTE — Telephone Encounter (Signed)
Sent to PCP to advise what you would like to do. Will ask pt more detailed questions about symptoms.

## 2018-10-27 NOTE — Telephone Encounter (Signed)
Error.  Sidi Dzikowski,cma  

## 2018-11-05 ENCOUNTER — Telehealth: Payer: Self-pay | Admitting: Family Medicine

## 2018-11-05 ENCOUNTER — Other Ambulatory Visit: Payer: Self-pay

## 2018-11-05 ENCOUNTER — Ambulatory Visit (INDEPENDENT_AMBULATORY_CARE_PROVIDER_SITE_OTHER): Payer: 59 | Admitting: Family Medicine

## 2018-11-05 ENCOUNTER — Encounter: Payer: Self-pay | Admitting: Family Medicine

## 2018-11-05 DIAGNOSIS — G4733 Obstructive sleep apnea (adult) (pediatric): Secondary | ICD-10-CM

## 2018-11-05 DIAGNOSIS — R079 Chest pain, unspecified: Secondary | ICD-10-CM | POA: Diagnosis not present

## 2018-11-05 DIAGNOSIS — M1990 Unspecified osteoarthritis, unspecified site: Secondary | ICD-10-CM | POA: Diagnosis not present

## 2018-11-05 DIAGNOSIS — R7303 Prediabetes: Secondary | ICD-10-CM | POA: Diagnosis not present

## 2018-11-05 DIAGNOSIS — I10 Essential (primary) hypertension: Secondary | ICD-10-CM

## 2018-11-05 DIAGNOSIS — M25521 Pain in right elbow: Secondary | ICD-10-CM

## 2018-11-05 NOTE — Assessment & Plan Note (Signed)
This has some typical and atypical features.  We will get him set up for follow-up with cardiology.

## 2018-11-05 NOTE — Assessment & Plan Note (Signed)
A1c ordered.

## 2018-11-05 NOTE — Assessment & Plan Note (Signed)
Improved control has some low blood pressures.  I suspect his untreated sleep apnea is contributing to this.  We will refer him to pulmonology to try to take care of his sleep apnea.  He will continue his current medications.

## 2018-11-05 NOTE — Assessment & Plan Note (Signed)
Refer to pulmonology to see if they can provide assistance with obtaining the CPAP.

## 2018-11-05 NOTE — Progress Notes (Signed)
Virtual Visit via video Note  This visit type was conducted due to national recommendations for restrictions regarding the COVID-19 pandemic (e.g. social distancing).  This format is felt to be most appropriate for this patient at this time.  All issues noted in this document were discussed and addressed.  No physical exam was performed (except for noted visual exam findings with Video Visits).   I connected with Wesley Bridges today at 10:30 AM EDT by a video enabled telemedicine application and verified that I am speaking with the correct person using two identifiers. Location patient: home Location provider: work or home office Persons participating in the virtual visit: patient, provider  I discussed the limitations, risks, security and privacy concerns of performing an evaluation and management service by telephone and the availability of in person appointments. I also discussed with the patient that there may be a patient responsible charge related to this service. The patient expressed understanding and agreed to proceed.  Reason for visit: follow-up  HPI: Hypertension: Has been ranging between 130s and 140s over 80s to 90s.  He is on lisinopril 20 mg daily and Bystolic 10 mg daily.  These will be updated in the chart.  He does note occasional sharp left-sided chest discomfort that does radiate down his arm and is associated with left arm numbness at times.  He does have some dyspnea when this occurs.  He does get somewhat diaphoretic.  This occurs a couple of times per week.  This lasts 30 seconds at a time.  Sometimes is associated with exertion and sometimes associated with rest.  He had a cardiac catheterization about 2 years ago with no significant coronary artery disease.  Right elbow pain: This has improved to some degree after he had a debridement of the tendon and reattachment.  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   He continues to follow-up with orthopedics.  Arthralgias: Patient has arthralgias of bilateral hands.  He did see rheumatology and his work-up was negative.  They tried him on Plaquenil though there was no benefit and that was stopped.  They recommended he try over-the-counter ibuprofen which has not been terribly helpful.  He has tried meloxicam in the past with no benefit.  OSA: Patient underwent a sleep study was diagnosed with this.  He was unable to afford the CPAP and thus this is not being treated currently.   ROS: See pertinent positives and negatives per HPI.  Past Medical History:  Diagnosis Date  . Anginal pain ([place])   . Arthritis   . Asthma   . Chronic headaches   . Complication of anesthesia    takes longer to wake up  . Depression   . Family history of adverse reaction to anesthesia    son takes longer to wake up than a normal person  . GERD (gastroesophageal reflux disease)   . Hyperlipidemia   . Hypertension   . Subclinical hyperthyroidism     Past Surgical History:  Procedure Laterality Date  . ANKLE FRACTURE SURGERY    . ANKLE FUSION     [facility]  . CARDIAC CATHETERIZATION    . FEMUR FRACTURE SURGERY    . FRACTURE SURGERY    . I&D EXTREMITY Right 07/30/2018   Procedure: DEBRIDEMENT OF THE COMMON EXTENSOR ORIGIN OF ELBOW;  Surgeon: Corky Mull, MD;  Location: [facility];  Service: Orthopedics;  Laterality: Right;  1.45 BIOMET JUGGERKNOT ANCHORS  .  LEFT HEART CATH AND CORONARY ANGIOGRAPHY N/A 01/08/2017   Procedure: Left Heart Cath and Coronary Angiography;  Surgeon: Nelva Bush, MD;  Location: [facility];  Service: Cardiovascular;  Laterality: N/A;  . MANDIBLE FRACTURE SURGERY    . ROBOT ASSISTED INGUINAL HERNIA REPAIR Bilateral 04/17/2018   Procedure: ROBOT ASSISTED INGUINAL HERNIA REPAIR;  Surgeon: Jules Husbands, MD;  Location: [facility];  Service: General;  Laterality: Bilateral;    Family History  Problem Relation Age of Onset  .  Arthritis Other        parents  . Hypertension Mother   . Thyroid disease Mother   . Osteoporosis Mother   . Syncope episode Mother   . Hypertension Brother     SOCIAL HX: Non-smoker   Current Outpatient Medications:  .  AJOVY 225 MG/1.5ML SOSY, , Disp: , Rfl:  .  aspirin EC 81 MG tablet, Take 81 mg by mouth daily., Disp: , Rfl:  .  fluticasone (FLONASE) 50 MCG/ACT nasal spray, Place 2 sprays into both nostrils daily. , Disp: , Rfl: 11 .  HYDROcodone-acetaminophen (NORCO/VICODIN) 5-325 MG tablet, Take 1-2 tablets by mouth every 6 (six) hours as needed for moderate pain., Disp: 40 tablet, Rfl: 0 .  hydroxychloroquine (PLAQUENIL) 200 MG tablet, Take 200 mg by mouth 2 (two) times daily., Disp: , Rfl:  .  lisinopril (ZESTRIL) 20 MG tablet, Take 20 mg by mouth daily., Disp: , Rfl:  .  loratadine (CLARITIN) 10 MG tablet, Take 1 tablet (10 mg total) by mouth daily. (Patient taking differently: Take 20 mg by mouth daily. ), Disp: 30 tablet, Rfl: 11 .  Multiple Vitamin (MULTIVITAMIN WITH MINERALS) TABS tablet, Take 1 tablet by mouth at bedtime., Disp: , Rfl:  .  nebivolol (BYSTOLIC) 10 MG tablet, Take 10 mg by mouth daily., Disp: , Rfl:  .  nitroGLYCERIN (NITROSTAT) 0.4 MG SL tablet, Place 1 tablet (0.4 mg total) under the tongue every 5 (five) minutes as needed for chest pain., Disp: 25 tablet, Rfl: 0 .  nortriptyline (PAMELOR) 10 MG capsule, Take 50 mg by mouth See admin instructions. Take 10mg  by mouth for one week, then increase to 20mg  nightly , Disp: , Rfl:  .  venlafaxine (EFFEXOR) 75 MG tablet, Take 75 mg by mouth 2 (two) times daily., Disp: , Rfl:   EXAM:  VITALS per patient if applicable: None.  GENERAL: alert, oriented, appears well and in no acute distress  HEENT: atraumatic, conjunttiva clear, no obvious abnormalities on inspection of external nose and ears  NECK: normal movements of the head and neck  LUNGS: on inspection no signs of respiratory distress, breathing rate  appears normal, no obvious gross SOB, gasping or wheezing  CV: no obvious cyanosis  MS: moves all visible extremities without noticeable abnormality  PSYCH/NEURO: pleasant and cooperative, no obvious depression or anxiety, speech and thought processing grossly intact  ASSESSMENT AND PLAN:  Discussed the following assessment and plan:  Prediabetes - Plan: Hemoglobin A1c  Essential hypertension - Plan: Ambulatory referral to Cardiology, Basic Metabolic Panel (BMET)  Arthritis  Chest pain, unspecified type - Plan: Ambulatory referral to Cardiology  OSA (obstructive sleep apnea) - Plan: Ambulatory referral to Pulmonology  Right elbow pain  Essential hypertension Improved control has some low blood pressures.  I suspect his untreated sleep apnea is contributing to this.  We will refer him to pulmonology to try to take care of his sleep apnea.  He will continue his current medications.  Arthritis Patient completed  evaluation with rheumatology.  No inflammatory arthritis found.  He will trial over-the-counter Aleve.  He will stop taking ibuprofen.  Chest pain This has some typical and atypical features.  We will get him set up for follow-up with cardiology.  OSA (obstructive sleep apnea) Refer to pulmonology to see if they can provide assistance with obtaining the CPAP.  Right elbow pain He will continue to see orthopedics.  Prediabetes A1c ordered.   CMA to contact patient and get him scheduled for follow-up in 3 months.  Labs in 1 month.  Social distancing precautions and sick precautions given regarding COVID-19.   I discussed the assessment and treatment plan with the patient. The patient was provided an opportunity to ask questions and all were answered. The patient agreed with the plan and demonstrated an understanding of the instructions.   The patient was advised to call back or seek an in-person evaluation if the symptoms worsen or if the condition fails to improve  as anticipated.   [name], MD

## 2018-11-05 NOTE — Assessment & Plan Note (Signed)
Patient completed evaluation with rheumatology.  No inflammatory arthritis found.  He will trial over-the-counter Aleve.  He will stop taking ibuprofen.

## 2018-11-05 NOTE — Assessment & Plan Note (Signed)
He will continue to see orthopedics. 

## 2018-11-05 NOTE — Telephone Encounter (Signed)
Please get the patient set up for lab work in 1 month and follow-up in 3 months.  Thanks.

## 2018-11-07 NOTE — Telephone Encounter (Signed)
mychart sent to inform patient

## 2018-11-13 ENCOUNTER — Telehealth: Payer: Self-pay | Admitting: Internal Medicine

## 2018-11-13 NOTE — Telephone Encounter (Signed)
Virtual Visit Pre-Appointment Phone Call  "(Name), I am calling you today to discuss your upcoming appointment. We are currently trying to limit exposure to the virus that causes COVID-19 by seeing patients at home rather than in the office."  1. "What is the BEST phone number to call the day of the visit?" - include this in appointment notes  2. Do you have or have access to (through a family member/friend) a smartphone with video capability that we can use for your visit?" a. If yes - list this number in appt notes as cell (if different from BEST phone #) and list the appointment type as a VIDEO visit in appointment notes b. If no - list the appointment type as a PHONE visit in appointment notes  3. Confirm consent - "In the setting of the current Covid19 crisis, you are scheduled for a (phone or video) visit with your provider on (date) at (time).  Just as we do with many in-office visits, in order for you to participate in this visit, we must obtain consent.  If you'd like, I can send this to your mychart (if signed up) or email for you to review.  Otherwise, I can obtain your verbal consent now.  All virtual visits are billed to your insurance company just like a normal visit would be.  By agreeing to a virtual visit, we'd like you to understand that the technology does not allow for your provider to perform an examination, and thus may limit your provider's ability to fully assess your condition. If your provider identifies any concerns that need to be evaluated in person, we will make arrangements to do so.  Finally, though the technology is pretty good, we cannot assure that it will always work on either your or our end, and in the setting of a video visit, we may have to convert it to a phone-only visit.  In either situation, we cannot ensure that we have a secure connection.  Are you willing to proceed?" STAFF: Did the patient verbally acknowledge consent to telehealth visit? Document  YES/NO here: YES  4. Advise patient to be prepared - "Two hours prior to your appointment, go ahead and check your blood pressure, pulse, oxygen saturation, and your weight (if you have the equipment to check those) and write them all down. When your visit starts, your provider will ask you for this information. If you have an Apple Watch or Kardia device, please plan to have heart rate information ready on the day of your appointment. Please have a pen and paper handy nearby the day of the visit as well."  5. Give patient instructions for MyChart download to smartphone OR Doximity/Doxy.me as below if video visit (depending on what platform provider is using)  6. Inform patient they will receive a phone call 15 minutes prior to their appointment time (may be from unknown caller ID) so they should be prepared to answer    TELEPHONE CALL NOTE  Wesley Bridges has been deemed a candidate for a follow-up tele-health visit to limit community exposure during the Covid-19 pandemic. I spoke with the patient via phone to ensure availability of phone/video source, confirm preferred email & phone number, and discuss instructions and expectations.  I reminded Wesley Bridges to be prepared with any vital sign and/or heart rhythm information that could potentially be obtained via home monitoring, at the time of his visit. I reminded Wesley Bridges to expect a phone call prior to  his visit.  Ace Gins 11/13/2018 3:19 PM   INSTRUCTIONS FOR DOWNLOADING THE MYCHART APP TO SMARTPHONE  - The patient must first make sure to have activated MyChart and know their login information - If Apple, go to [employer] and type in MyChart in the search bar and download the app. If Android, ask patient to go to [employer] and type in [place] in the search bar and download the app. The app is free but as with any other app downloads, their phone may require them to verify saved payment information  or Apple/Android password.  - The patient will need to then log into the app with their MyChart username and password, and select [facility] as their healthcare provider to link the account. When it is time for your visit, go to the MyChart app, find appointments, and click Begin Video Visit. Be sure to Select Allow for your device to access the Microphone and Camera for your visit. You will then be connected, and your provider will be with you shortly.  **If they have any issues connecting, or need assistance please contact MyChart service desk [phone])**  **If using a computer, in order to ensure the best quality for their visit they will need to use either of the following Internet Browsers: [employer], or Google Chrome**  IF USING DOXIMITY or DOXY.ME - The patient will receive a link just prior to their visit by text.     FULL LENGTH CONSENT FOR TELE-HEALTH VISIT   I hereby voluntarily request, consent and authorize [facility] and its employed or contracted physicians, physician assistants, nurse practitioners or other licensed health care professionals (the Practitioner), to provide me with telemedicine health care services (the Services") as deemed necessary by the treating Practitioner. I acknowledge and consent to receive the Services by the Practitioner via telemedicine. I understand that the telemedicine visit will involve communicating with the Practitioner through live audiovisual communication technology and the disclosure of certain medical information by electronic transmission. I acknowledge that I have been given the opportunity to request an in-person assessment or other available alternative prior to the telemedicine visit and am voluntarily participating in the telemedicine visit.  I understand that I have the right to withhold or withdraw my consent to the use of telemedicine in the course of my care at any time, without affecting my right to future  care or treatment, and that the Practitioner or I may terminate the telemedicine visit at any time. I understand that I have the right to inspect all information obtained and/or recorded in the course of the telemedicine visit and may receive copies of available information for a reasonable fee.  I understand that some of the potential risks of receiving the Services via telemedicine include:   Delay or interruption in medical evaluation due to technological equipment failure or disruption;  Information transmitted may not be sufficient (e.g. poor resolution of images) to allow for appropriate medical decision making by the Practitioner; and/or   In rare instances, security protocols could fail, causing a breach of personal health information.  Furthermore, I acknowledge that it is my responsibility to provide information about my medical history, conditions and care that is complete and accurate to the best of my ability. I acknowledge that Practitioner's advice, recommendations, and/or decision may be based on factors not within their control, such as incomplete or inaccurate data provided by me or distortions of diagnostic images or specimens that may result from electronic transmissions. I understand  that the practice of medicine is not an exact science and that Practitioner makes no warranties or guarantees regarding treatment outcomes. I acknowledge that I will receive a copy of this consent concurrently upon execution via email to the email address I last provided but may also request a printed copy by calling the office of [facility].    I understand that my insurance will be billed for this visit.   I have read or had this consent read to me.  I understand the contents of this consent, which adequately explains the benefits and risks of the Services being provided via telemedicine.   I have been provided ample opportunity to ask questions regarding this consent and the Services and have  had my questions answered to my satisfaction.  I give my informed consent for the services to be provided through the use of telemedicine in my medical care  By participating in this telemedicine visit I agree to the above.

## 2018-11-26 ENCOUNTER — Telehealth: Payer: Self-pay | Admitting: Internal Medicine

## 2018-11-26 ENCOUNTER — Ambulatory Visit (INDEPENDENT_AMBULATORY_CARE_PROVIDER_SITE_OTHER): Payer: 59 | Admitting: Internal Medicine

## 2018-11-26 DIAGNOSIS — G4733 Obstructive sleep apnea (adult) (pediatric): Secondary | ICD-10-CM

## 2018-11-26 NOTE — Telephone Encounter (Signed)
I tried to call Wesley Bridges back but had to leave a message for her to call me. I faxed 33 pages to [phone] which is the fax number that we were given to fax records to

## 2018-11-26 NOTE — Patient Instructions (Signed)
You have already been diagnosed with moderate obstructive sleep apnea, will refer you to a DME company to start you on CPAP with pressure range 5-20 cm H2O.   Sleep Apnea    Sleep apnea is disorder that affects a person's sleep. A person with sleep apnea has abnormal pauses in their breathing when they sleep. It is hard for them to get a good sleep. This makes a person tired during the day. It also can lead to other physical problems. There are three types of sleep apnea. One type is when breathing stops for a short time because your airway is blocked (obstructive sleep apnea). Another type is when the brain sometimes fails to give the normal signal to breathe to the muscles that control your breathing (central sleep apnea). The third type is a combination of the other two types.  HOME CARE   Take all medicine as told by your doctor.  Avoid alcohol, calming medicines (sedatives), and depressant drugs.  Try to lose weight if you are overweight. Talk to your doctor about a healthy weight goal.  Your doctor may have you use a device that helps to open your airway. It can help you get the air that you need. It is called a positive airway pressure (PAP) device.   MAKE SURE YOU:   Understand these instructions.  Will watch your condition.  Will get help right away if you are not doing well or get worse.  It may take approximately 1 month for you to get used to wearing her CPAP every night.  Be sure to work with your machine to get used to it, be patient, it may take time!  If you have trouble tolerating CPAP DO NOT RETURN YOUR MACHINE; Contact our office to see if we can help you tolerate the CPAP better first!

## 2018-11-26 NOTE — Progress Notes (Signed)
[facility] Consultation     Virtual Visit Note I connected with patient on 11/26/18 at  9:30 AM EDT by telephone and video and verified that I am speaking with the correct person using two identifiers.   I discussed the limitations, risks, security and privacy concerns of performing an evaluation and management service remotely and the availability of in person appointments. I also discussed with the patient that there may be Wesley patient responsible charge related to this service. The patient expressed understanding and agreed to proceed. I discussed the assessment and treatment plan with the patient. The patient was provided an opportunity to ask questions and all were answered. The patient agreed with the plan and demonstrated an understanding of the instructions. Please see note below for further detail.    The patient was advised to call back or seek an in-person evaluation if the symptoms worsen or if the condition fails to improve as anticipated.  Wesley Hobby, MD   Assessment and Plan:  Excessive daytime sleepiness with obstructive sleep apnea. - We will start on CPAP with pressure range 5-20.  GERD, hypertension. - Above conditions can be contributed to by obstructive sleep apnea, therefore treatment of sleep apnea is an important part of their management.   Date: 11/26/2018  MRN# [id number] Wesley Bridges 1971-04-04  Referring Physician: Dr. Marina Goodell Wesley Bridges is Wesley 48 y.o. old male seen in consultation for chief complaint of: daytime sleepiness.    HPI:   He has been having issues with sleep. He has been diagnosed with OSA with Wesley sleep study, at [facility]. He was recommended to be on CPAP, but it has not yet started.  He is sleepy during the day, he is on disability due to work injury.  No TMJ, denies jaw pain, cataplexy, sleep paralysis.   **Sleep study PSG on 07/21/18>> AHI of 25.   PMHX:   Past Medical History:   Diagnosis Date  . Anginal pain ([place])   . Arthritis   . Asthma   . Chronic headaches   . Complication of anesthesia    takes longer to wake up  . Depression   . Family history of adverse reaction to anesthesia    son takes longer to wake up than Wesley normal person  . GERD (gastroesophageal reflux disease)   . Hyperlipidemia   . Hypertension   . Subclinical hyperthyroidism    Surgical Hx:  Past Surgical History:  Procedure Laterality Date  . ANKLE FRACTURE SURGERY    . ANKLE FUSION     [facility]  . CARDIAC CATHETERIZATION    . FEMUR FRACTURE SURGERY    . FRACTURE SURGERY    . I&D EXTREMITY Right 07/30/2018   Procedure: DEBRIDEMENT OF THE COMMON EXTENSOR ORIGIN OF ELBOW;  Surgeon: Corky Mull, MD;  Location: [facility];  Service: Orthopedics;  Laterality: Right;  1.45 BIOMET JUGGERKNOT ANCHORS  . LEFT HEART CATH AND CORONARY ANGIOGRAPHY N/Wesley 01/08/2017   Procedure: Left Heart Cath and Coronary Angiography;  Surgeon: Nelva Bush, MD;  Location: [facility];  Service: Cardiovascular;  Laterality: N/Wesley;  . MANDIBLE FRACTURE SURGERY    . ROBOT ASSISTED INGUINAL HERNIA REPAIR Bilateral 04/17/2018   Procedure: ROBOT ASSISTED INGUINAL HERNIA REPAIR;  Surgeon: Jules Husbands, MD;  Location: [facility];  Service: General;  Laterality: Bilateral;   Family Hx:  Family History  Problem Relation Age of Onset  . Arthritis Other  parents  . Hypertension Mother   . Thyroid disease Mother   . Osteoporosis Mother   . Syncope episode Mother   . Hypertension Brother    Social Hx:   Social History   Tobacco Use  . Smoking status: Never Smoker  . Smokeless tobacco: Never Used  Substance Use Topics  . Alcohol use: Yes    Alcohol/week: 14.0 standard drinks    Types: 14 Cans of beer per week  . Drug use: No   Medication:    Current Outpatient Medications:  .  AJOVY 225 MG/1.5ML SOSY, , Disp: , Rfl:  .  aspirin EC 81 MG tablet, Take 81 mg by mouth daily., Disp: , Rfl:   .  fluticasone (FLONASE) 50 MCG/ACT nasal spray, Place 2 sprays into both nostrils daily. , Disp: , Rfl: 11 .  HYDROcodone-acetaminophen (NORCO/VICODIN) 5-325 MG tablet, Take 1-2 tablets by mouth every 6 (six) hours as needed for moderate pain., Disp: 40 tablet, Rfl: 0 .  hydroxychloroquine (PLAQUENIL) 200 MG tablet, Take 200 mg by mouth 2 (two) times daily., Disp: , Rfl:  .  lisinopril (ZESTRIL) 20 MG tablet, Take 20 mg by mouth daily., Disp: , Rfl:  .  loratadine (CLARITIN) 10 MG tablet, Take 1 tablet (10 mg total) by mouth daily. (Patient taking differently: Take 20 mg by mouth daily. ), Disp: 30 tablet, Rfl: 11 .  Multiple Vitamin (MULTIVITAMIN WITH MINERALS) TABS tablet, Take 1 tablet by mouth at bedtime., Disp: , Rfl:  .  nebivolol (BYSTOLIC) 10 MG tablet, Take 10 mg by mouth daily., Disp: , Rfl:  .  nitroGLYCERIN (NITROSTAT) 0.4 MG SL tablet, Place 1 tablet (0.4 mg total) under the tongue every 5 (five) minutes as needed for chest pain., Disp: 25 tablet, Rfl: 0 .  nortriptyline (PAMELOR) 10 MG capsule, Take 50 mg by mouth See admin instructions. Take 10mg  by mouth for one week, then increase to 20mg  nightly , Disp: , Rfl:  .  venlafaxine (EFFEXOR) 75 MG tablet, Take 75 mg by mouth 2 (two) times daily., Disp: , Rfl:    Allergies:  Bee pollen and Pollen extract  Review of Systems: Gen:  Denies  fever, sweats, chills HEENT: Denies blurred vision, double vision. bleeds, sore throat Cvc:  No dizziness, chest pain. Resp:   Denies cough or sputum production, shortness of breath Gi: Denies swallowing difficulty, stomach pain. Gu:  Denies bladder incontinence, burning urine Ext:   No Joint pain, stiffness. Skin: No skin rash,  hives  Endoc:  No polyuria, polydipsia. Psych: No depression, insomnia. Other:  All other systems were reviewed with the patient and were negative other that what is mentioned in the HPI.   Physical Examination:  -    LABORATORY PANEL:   CBC No results for  input(s): WBC, HGB, HCT, PLT in the last 168 hours. ------------------------------------------------------------------------------------------------------------------  Chemistries  No results for input(s): NA, K, CL, CO2, GLUCOSE, BUN, CREATININE, CALCIUM, MG, AST, ALT, ALKPHOS, BILITOT in the last 168 hours.  Invalid input(s): GFRCGP ------------------------------------------------------------------------------------------------------------------  Cardiac Enzymes No results for input(s): TROPONINI in the last 168 hours. ------------------------------------------------------------  RADIOLOGY:  No results found.     Thank  you for the consultation and for allowing [facility] to assist in the care of your patient. Our recommendations are noted above.  Please contact us if we can be of further service.   Marda Stalker, M.D., F.C.C.P.  Board Certified in Internal Medicine, Pulmonary Medicine, [place], and Sleep Medicine.  [facility] Office Number: [phone]   11/26/2018

## 2018-11-26 NOTE — Addendum Note (Signed)
Addended by: Maryanna Shape A on: 11/26/2018 10:05 AM   Modules accepted: Orders

## 2018-11-27 NOTE — Telephone Encounter (Signed)
When I arrived at work this morning [employer]ience had returned my call stating the sleep study copy that they received was blurry. When she called in originally I don't know if she mentioned that or not. I faxed another copy which was scanned under procedure which looked a little better

## 2018-12-01 DIAGNOSIS — G44221 Chronic tension-type headache, intractable: Secondary | ICD-10-CM | POA: Insufficient documentation
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
## 2018-12-04 ENCOUNTER — Other Ambulatory Visit: Payer: Self-pay

## 2018-12-04 ENCOUNTER — Telehealth: Payer: 59 | Admitting: Internal Medicine

## 2018-12-08 DIAGNOSIS — M19021 Primary osteoarthritis, right elbow: Secondary | ICD-10-CM | POA: Insufficient documentation

## 2019-01-12 DIAGNOSIS — R519 Headache, unspecified: Secondary | ICD-10-CM | POA: Insufficient documentation

## 2019-01-23 ENCOUNTER — Other Ambulatory Visit: Payer: Self-pay | Admitting: Family Medicine

## 2019-01-29 NOTE — Telephone Encounter (Signed)
Bystolic is a historical medication and protonix is not in the pt's medication list.

## 2019-01-30 NOTE — Telephone Encounter (Signed)
Bystolic sent to pharmacy.  Please contact the patient and see if he has been taking the Protonix consistently.  Confirm whether or not he is taking it for reflux.  Thanks.

## 2019-02-12 ENCOUNTER — Other Ambulatory Visit: Payer: Self-pay | Admitting: Surgery

## 2019-02-12 DIAGNOSIS — M2241 Chondromalacia patellae, right knee: Secondary | ICD-10-CM

## 2019-02-12 DIAGNOSIS — M2242 Chondromalacia patellae, left knee: Secondary | ICD-10-CM

## 2019-02-16 ENCOUNTER — Encounter: Payer: Self-pay | Admitting: Family Medicine

## 2019-02-24 ENCOUNTER — Ambulatory Visit: Admission: RE | Admit: 2019-02-24 | Payer: 59 | Source: Ambulatory Visit

## 2019-02-26 ENCOUNTER — Encounter: Payer: Self-pay | Admitting: Family Medicine

## 2019-03-04 ENCOUNTER — Ambulatory Visit: Payer: 59 | Admitting: Surgery

## 2019-03-05 MED ORDER — PANTOPRAZOLE SODIUM 40 MG PO TBEC: 40.0000 mg | DELAYED_RELEASE_TABLET | Freq: Every day | ORAL | 3 refills | Status: DC

## 2019-03-09 ENCOUNTER — Telehealth: Payer: Self-pay | Admitting: *Deleted

## 2019-03-09 ENCOUNTER — Ambulatory Visit: Payer: 59 | Admitting: Surgery

## 2019-03-09 NOTE — Telephone Encounter (Signed)
Message left on cell phone for patient to call the office.   Patient had a 9:45 am appointment with Dr. Dahlia Byes today and has not shown up yet.

## 2019-03-11 ENCOUNTER — Other Ambulatory Visit: Payer: Self-pay

## 2019-03-11 ENCOUNTER — Ambulatory Visit
Admission: RE | Admit: 2019-03-11 | Discharge: 2019-03-11 | Disposition: A | Discharge status: Home / Self Care | Payer: 59 | Source: Ambulatory Visit | Attending: Surgery | Admitting: Surgery

## 2019-03-11 DIAGNOSIS — M2242 Chondromalacia patellae, left knee: Secondary | ICD-10-CM

## 2019-03-11 DIAGNOSIS — M2241 Chondromalacia patellae, right knee: Secondary | ICD-10-CM | POA: Diagnosis present

## 2019-03-23 DIAGNOSIS — S83232A Complex tear of medial meniscus, current injury, left knee, initial encounter: Secondary | ICD-10-CM | POA: Insufficient documentation

## 2019-03-27 ENCOUNTER — Encounter: Payer: Self-pay | Admitting: Family Medicine

## 2019-03-30 ENCOUNTER — Other Ambulatory Visit: Payer: Self-pay

## 2019-03-30 MED ORDER — FLUTICASONE PROPIONATE 50 MCG/ACT NA SUSP: 2.0000 | Freq: Every day | NASAL | 11 refills | Status: DC

## 2019-04-01 ENCOUNTER — Other Ambulatory Visit: Payer: Self-pay

## 2019-04-01 ENCOUNTER — Encounter
Admission: RE | Admit: 2019-04-01 | Discharge: 2019-04-01 | Disposition: A | Discharge status: Home / Self Care | Payer: 59 | Source: Ambulatory Visit | Attending: Surgery | Admitting: Surgery

## 2019-04-01 DIAGNOSIS — Z01818 Encounter for other preprocedural examination: Secondary | ICD-10-CM | POA: Diagnosis not present

## 2019-04-01 HISTORY — DX: Dyspnea, unspecified: R06.00

## 2019-04-01 HISTORY — DX: Sleep apnea, unspecified: G47.30

## 2019-04-01 HISTORY — DX: Cardiac arrhythmia, unspecified: I49.9

## 2019-04-01 LAB — BASIC METABOLIC PANEL
Anion gap: 7 (ref 5–15)
BUN: 15 mg/dL (ref 6–20)
CO2: 28 mmol/L (ref 22–32)
Calcium: 9.4 mg/dL (ref 8.9–10.3)
Chloride: 106 mmol/L (ref 98–111)
Creatinine, Ser: 0.91 mg/dL (ref 0.61–1.24)
GFR calc Af Amer: >60 mL/min (ref >60)
GFR calc non Af Amer: >60 mL/min (ref >60)
Glucose, Bld: 106 mg/dL — ABNORMAL HIGH (ref 70–99)
Potassium: 3.7 mmol/L (ref 3.5–5.1)
Sodium: 141 mmol/L (ref 135–145)

## 2019-04-01 LAB — CBC
HCT: 45.6 % (ref 39.0–52.0)
Hemoglobin: 14.9 g/dL (ref 13.0–17.0)
MCH: 30 pg (ref 26.0–34.0)
MCHC: 32.7 g/dL (ref 30.0–36.0)
MCV: 91.8 fL (ref 80.0–100.0)
Platelets: 281 10*3/uL (ref 150–400)
RBC: 4.97 MIL/uL (ref 4.22–5.81)
RDW: 13 % (ref 11.5–15.5)
WBC: 7.3 10*3/uL (ref 4.0–10.5)
nRBC: 0 % (ref 0.0–0.2)

## 2019-04-01 NOTE — Patient Instructions (Signed)
Your procedure is scheduled on: 04/07/2019 Tues Report to Same Day Surgery 2nd floor medical mall [facility] Entrance-take elevator on left to 2nd floor.  Check in with surgery information desk.) To find out your arrival time please call [phone] between 1PM - 3PM on 04/06/2019 Mon  Remember: Instructions that are not followed completely may result in serious medical risk, up to and including death, or upon the discretion of your surgeon and anesthesiologist your surgery may need to be rescheduled.    _x___ 1. Do not eat food after midnight the night before your procedure. You may drink clear liquids up to 2 hours before you are scheduled to arrive at the hospital for your procedure.  Do not drink clear liquids within 2 hours of your scheduled arrival to the hospital.  Clear liquids include  --Water or Apple juice without pulp  --Clear carbohydrate beverage such as ClearFast or Gatorade  --Black Coffee or Clear Tea (No milk, no creamers, do not add anything to                  the coffee or Tea Type 1 and type 2 diabetics should only drink water.   ____Ensure clear carbohydrate drink on the way to the hospital for bariatric patients  ____Ensure clear carbohydrate drink 3 hours before surgery.   No gum chewing or hard candies.     __x__ 2. No Alcohol for 24 hours before or after surgery.   __x__3. No Smoking or e-cigarettes for 24 prior to surgery.  Do not use any chewable tobacco products for at least 6 hour prior to surgery   ____  4. Bring all medications with you on the day of surgery if instructed.    __x__ 5. Notify your doctor if there is any change in your medical condition     (cold, fever, infections).    x___6. On the morning of surgery brush your teeth with toothpaste and water.  You may rinse your mouth with mouth wash if you wish.  Do not swallow any toothpaste or mouthwash.   Do not wear jewelry, make-up, hairpins, clips or nail polish.  Do not wear lotions,  powders, or perfumes. You may wear deodorant.  Do not shave 48 hours prior to surgery. Men may shave face and neck.  Do not bring valuables to the hospital.    [facility] is not responsible for any belongings or valuables.               Contacts, dentures or bridgework may not be worn into surgery.  Leave your suitcase in the car. After surgery it may be brought to your room.  For patients admitted to the hospital, discharge time is determined by your                       treatment team.  _  Patients discharged the day of surgery will not be allowed to drive home.  You will need someone to drive you home and stay with you the night of your procedure.    Please read over the following fact sheets that you were given:   [facility] Preparing for Surgery and or MRSA Information   _x___ Take anti-hypertensive listed below, cardiac, seizure, asthma,     anti-reflux and psychiatric medicines. These include:  1. BYSTOLIC 10 MG tablet  2.fluticasone (FLONASE) 50 MCG/ACT nasal spray  3.loratadine (CLARITIN) 10 MG tablet  4.pantoprazole (PROTONIX) 40 MG tablet  5.venlafaxine (EFFEXOR)  75 MG tablet  6.  ____Fleets enema or Magnesium Citrate as directed.   _x___ Use CHG Soap or sage wipes as directed on instruction sheet   ____ Use inhalers on the day of surgery and bring to hospital day of surgery  ____ Stop Metformin and Janumet 2 days prior to surgery.    ____ Take 1/2 of usual insulin dose the night before surgery and none on the morning     surgery.   _x___ Follow recommendations from Cardiologist, Pulmonologist or PCP regarding          stopping Aspirin, Coumadin, Plavix ,Eliquis, Effient, or Pradaxa, and Pletal.  X____Stop Anti-inflammatories such as Advil, Aleve, Ibuprofen, Motrin, Naproxen, Naprosyn, Goodies powders or aspirin products. OK to take Tylenol and                          Celebrex.   _x___ Stop supplements until after surgery.  But may continue Vitamin D, Vitamin B,        and multivitamin.   __x__ Bring C-Pap to the hospital.

## 2019-04-03 ENCOUNTER — Encounter: Payer: Self-pay | Admitting: Family Medicine

## 2019-04-03 ENCOUNTER — Other Ambulatory Visit
Admission: RE | Admit: 2019-04-03 | Discharge: 2019-04-03 | Disposition: A | Discharge status: Home / Self Care | Payer: 59 | Source: Ambulatory Visit | Attending: Surgery | Admitting: Surgery

## 2019-04-03 ENCOUNTER — Other Ambulatory Visit: Payer: Self-pay

## 2019-04-03 DIAGNOSIS — Z20828 Contact with and (suspected) exposure to other viral communicable diseases: Secondary | ICD-10-CM | POA: Insufficient documentation

## 2019-04-03 DIAGNOSIS — Z01812 Encounter for preprocedural laboratory examination: Secondary | ICD-10-CM | POA: Insufficient documentation

## 2019-04-03 LAB — SARS CORONAVIRUS 2 (TAT 6-24 HRS): SARS Coronavirus 2: NEGATIVE

## 2019-04-06 ENCOUNTER — Other Ambulatory Visit: Payer: Self-pay

## 2019-04-06 ENCOUNTER — Ambulatory Visit: Payer: 59 | Admitting: Family Medicine

## 2019-04-06 ENCOUNTER — Telehealth: Payer: Self-pay

## 2019-04-06 NOTE — Telephone Encounter (Signed)
I called to [facility] and spoke with nurse Tiffany  today about the patients knee replacement surgery that is scheduled for 10/13, I informed her that we did not receive the medical clearance form until Friday and she stated that if she pushes the surgery out it will be in 1 month, I then called the patient and scheduled him with Lauren today to do the medical clearance.  Sami Roes,cma

## 2019-04-06 NOTE — Telephone Encounter (Addendum)
Received a call from [place] at [facility] stating that she thought patient was scheduled an appt to see a PA for surgical clearance today but noticed that pt is scheduled to see Dr. Caryl Bis, PCP tomorrow @ 3:00 pm and said that tomorrow would be too late as patient is scheduled for knee surgery tomorrow.  I spoke to Gae Bon, Dr. Ellen Henri CMA who informed me that patient also needs to get a surgical clearance from his cardiologist.  Gae Bon said that patient is aware that he also needs to get a surgical clearance from his cardiologist which means that patient will have to push surgery date out.  Gae Bon said that patient is aware and is ok for surgery to be postponed.    Informed Tiffany from [facility] of this.  Tiffany is aware and said she will contact patient.

## 2019-04-07 ENCOUNTER — Encounter: Payer: Self-pay | Admitting: Family Medicine

## 2019-04-07 ENCOUNTER — Other Ambulatory Visit: Payer: Self-pay

## 2019-04-07 ENCOUNTER — Ambulatory Visit: Admission: RE | Admit: 2019-04-07 | Payer: 59 | Source: Home / Self Care | Admitting: Surgery

## 2019-04-07 ENCOUNTER — Ambulatory Visit (INDEPENDENT_AMBULATORY_CARE_PROVIDER_SITE_OTHER): Payer: 59 | Admitting: Family Medicine

## 2019-04-07 ENCOUNTER — Encounter: Admission: RE | Payer: Self-pay | Source: Home / Self Care

## 2019-04-07 DIAGNOSIS — I10 Essential (primary) hypertension: Secondary | ICD-10-CM

## 2019-04-07 DIAGNOSIS — Z23 Encounter for immunization: Secondary | ICD-10-CM | POA: Diagnosis not present

## 2019-04-07 DIAGNOSIS — Z01818 Encounter for other preprocedural examination: Secondary | ICD-10-CM | POA: Insufficient documentation

## 2019-04-07 DIAGNOSIS — R7303 Prediabetes: Secondary | ICD-10-CM | POA: Diagnosis not present

## 2019-04-07 LAB — POCT GLYCOSYLATED HEMOGLOBIN (HGB A1C): Hemoglobin A1C: 5.5 % (ref 4.0–5.6)

## 2019-04-07 SURGERY — ARTHROSCOPY, KNEE, WITH MEDIAL MENISCECTOMY
Anesthesia: Choice | Site: Knee | Laterality: Left

## 2019-04-07 NOTE — Assessment & Plan Note (Signed)
A1c is normal now.

## 2019-04-07 NOTE — Assessment & Plan Note (Signed)
Patient is low risk based on Gupta perioperative risk score of 0.11%.  Medically he is stable and low risk.  He was having angina and shortness of breath symptoms earlier in the summer and thus I will refer him back to his cardiologist to get their input for clearance as well.

## 2019-04-07 NOTE — Patient Instructions (Signed)
Nice to see you. We will contact you with your A1c result. We will get you to see cardiology to get their input on your surgical clearance as well.

## 2019-04-07 NOTE — Progress Notes (Signed)
  [name], MD Phone: [phone]  Wesley Bridges is a 48 y.o. male who presents today for f/u.  Preop clearance: Patient presents for preoperative clearance for left knee scope.  On the clearance request reports shortness of breath and angina.  The patient reports he had those earlier this summer though he is not had anything in 3 to 4 months.  He was supposed to see cardiology previously though never heard about an appointment.  He has no chest pain or breathlessness when climbing 2 flights of stairs at a normal speed.  No kidney disease.  No history of heart attack.  No history of stroke.  He does report an irregular heartbeat in the past.  He does follow with cardiology.  No history of seizures.  No history of thyroid disease, liver disease, asthma, COPD, or diabetes.  Social History   Tobacco Use  Smoking Status Never Smoker  Smokeless Tobacco Never Used     ROS see history of present illness  Objective  Physical Exam Vitals:   04/07/19 1523  BP: 130/70  Pulse: 65  Temp: (!) 97.5 F (36.4 C)  SpO2: 98%    BP Readings from Last 3 Encounters:  04/07/19 130/70  04/01/19 118/85  07/30/18 120/80   Wt Readings from Last 3 Encounters:  04/07/19 213 lb (96.6 kg)  04/01/19 210 lb (95.3 kg)  07/30/18 220 lb (99.8 kg)    Physical Exam Constitutional:      General: He is not in acute distress.    Appearance: He is not diaphoretic.  Cardiovascular:     Rate and Rhythm: Normal rate and regular rhythm.     Heart sounds: Normal heart sounds.  Pulmonary:     Effort: Pulmonary effort is normal.     Breath sounds: Normal breath sounds.  Abdominal:     General: Bowel sounds are normal. There is no distension.     Palpations: Abdomen is soft.     Tenderness: There is no abdominal tenderness. There is no guarding or rebound.  Musculoskeletal:     Right lower leg: No edema.     Left lower leg: No edema.  Skin:    General: Skin is warm and dry.  Neurological:     Mental Status: He is alert.      Assessment/Plan: Please see individual problem list.  Preop examination Patient is low risk based on Gupta perioperative risk score of 0.11%.  Medically he is stable and low risk.  He was having angina and shortness of breath symptoms earlier in the summer and thus I will refer him back to his cardiologist to get their input for clearance as well.  Prediabetes A1c is normal now.    Orders Placed This Encounter  Procedures  . Flu Vaccine QUAD 36+ mos IM  . Ambulatory referral to Cardiology    Referral Priority:   Routine    Referral Type:   Consultation    Referral Reason:   Specialty Services Required    Requested Specialty:   Cardiology    Number of Visits Requested:   1  . POCT HgB A1C    No orders of the defined types were placed in this encounter.    [name], MD [facility]

## 2019-04-09 ENCOUNTER — Encounter: Payer: Self-pay | Admitting: Nurse Practitioner

## 2019-04-09 ENCOUNTER — Other Ambulatory Visit: Payer: Self-pay

## 2019-04-09 ENCOUNTER — Ambulatory Visit (INDEPENDENT_AMBULATORY_CARE_PROVIDER_SITE_OTHER): Payer: 59 | Admitting: Physician Assistant

## 2019-04-09 VITALS — BP 120/90 | HR 66 | Ht 67.0 in | Wt 213.2 lb

## 2019-04-09 DIAGNOSIS — E785 Hyperlipidemia, unspecified: Secondary | ICD-10-CM | POA: Diagnosis not present

## 2019-04-09 DIAGNOSIS — Z01818 Encounter for other preprocedural examination: Secondary | ICD-10-CM

## 2019-04-09 DIAGNOSIS — R002 Palpitations: Secondary | ICD-10-CM

## 2019-04-09 DIAGNOSIS — I10 Essential (primary) hypertension: Secondary | ICD-10-CM

## 2019-04-09 DIAGNOSIS — R0789 Other chest pain: Secondary | ICD-10-CM | POA: Diagnosis not present

## 2019-04-09 DIAGNOSIS — I77810 Thoracic aortic ectasia: Secondary | ICD-10-CM

## 2019-04-09 NOTE — Patient Instructions (Signed)
Medication Instructions:  Your physician recommends that you continue on your current medications as directed. Please refer to the Current Medication list given to you today.  *If you need a refill on your cardiac medications before your next appointment, please call your pharmacy*  Lab Work: None ordered  If you have labs (blood work) drawn today and your tests are completely normal, you will receive your results only by: Marland Kitchen MyChart Message (if you have MyChart) OR . A paper copy in the mail If you have any lab test that is abnormal or we need to change your treatment, we will call you to review the results.  Testing/Procedures: None ordered   Follow-Up: At [facility], you and your health needs are our priority.  As part of our continuing mission to provide you with exceptional heart care, we have created designated Provider Care Teams.  These Care Teams include your primary Cardiologist (physician) and Advanced Practice Providers (APPs -  Physician Assistants and Nurse Practitioners) who all work together to provide you with the care you need, when you need it.  Your next appointment:   as needed  The format for your next appointment:   In Person  Provider:    You may see Nelva Bush, MD or one of the following Advanced Practice Providers on your designated Care Team:    Murray Hodgkins, NP  Christell Faith, PA-C  Marrianne Mood, PA-C
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
## 2019-04-10 ENCOUNTER — Ambulatory Visit: Payer: 59 | Admitting: Family Medicine

## 2019-04-14 NOTE — Telephone Encounter (Signed)
error 

## 2019-04-23 ENCOUNTER — Other Ambulatory Visit: Payer: Self-pay | Admitting: Surgery

## 2019-05-07 ENCOUNTER — Other Ambulatory Visit: Payer: 59

## 2019-05-08 ENCOUNTER — Other Ambulatory Visit
Admission: RE | Admit: 2019-05-08 | Discharge: 2019-05-08 | Disposition: A | Discharge status: Home / Self Care | Payer: 59 | Source: Ambulatory Visit | Attending: Surgery | Admitting: Surgery

## 2019-05-08 NOTE — Progress Notes (Signed)
Did not show up for covid testing today. Tried to leave a message but the voicemail box was full and could not leave a message. Will reschedule patient covid test for Monday.

## 2019-05-11 ENCOUNTER — Other Ambulatory Visit: Payer: Self-pay

## 2019-05-11 ENCOUNTER — Other Ambulatory Visit
Admission: RE | Admit: 2019-05-11 | Discharge: 2019-05-11 | Disposition: A | Discharge status: Home / Self Care | Payer: 59 | Source: Ambulatory Visit | Attending: Surgery | Admitting: Surgery

## 2019-05-11 DIAGNOSIS — Z01812 Encounter for preprocedural laboratory examination: Secondary | ICD-10-CM | POA: Diagnosis not present

## 2019-05-11 DIAGNOSIS — Z20828 Contact with and (suspected) exposure to other viral communicable diseases: Secondary | ICD-10-CM | POA: Diagnosis not present

## 2019-05-11 LAB — SARS CORONAVIRUS 2 (TAT 6-24 HRS): SARS Coronavirus 2: NEGATIVE

## 2019-05-12 MED ORDER — CEFAZOLIN SODIUM-DEXTROSE 2-4 GM/100ML-% IV SOLN
2.0000 g | INTRAVENOUS | Status: AC
  Administered 2019-05-13: 2 g via INTRAVENOUS

## 2019-05-13 ENCOUNTER — Other Ambulatory Visit: Payer: Self-pay

## 2019-05-13 ENCOUNTER — Encounter
Admission: RE | Disposition: A | Discharge status: Home / Self Care | Payer: Self-pay | Source: Home / Self Care | Attending: Surgery

## 2019-05-13 ENCOUNTER — Encounter: Payer: Self-pay | Admitting: *Deleted

## 2019-05-13 ENCOUNTER — Ambulatory Visit
Admission: RE | Admit: 2019-05-13 | Discharge: 2019-05-13 | Disposition: A | Discharge status: Home / Self Care | Payer: 59 | Attending: Surgery | Admitting: Surgery

## 2019-05-13 ENCOUNTER — Ambulatory Visit: Payer: 59 | Admitting: Anesthesiology

## 2019-05-13 DIAGNOSIS — Z8249 Family history of ischemic heart disease and other diseases of the circulatory system: Secondary | ICD-10-CM | POA: Insufficient documentation

## 2019-05-13 DIAGNOSIS — M1990 Unspecified osteoarthritis, unspecified site: Secondary | ICD-10-CM | POA: Diagnosis not present

## 2019-05-13 DIAGNOSIS — Z7982 Long term (current) use of aspirin: Secondary | ICD-10-CM | POA: Insufficient documentation

## 2019-05-13 DIAGNOSIS — Z791 Long term (current) use of non-steroidal anti-inflammatories (NSAID): Secondary | ICD-10-CM | POA: Insufficient documentation

## 2019-05-13 DIAGNOSIS — M2242 Chondromalacia patellae, left knee: Secondary | ICD-10-CM | POA: Insufficient documentation

## 2019-05-13 DIAGNOSIS — Z8349 Family history of other endocrine, nutritional and metabolic diseases: Secondary | ICD-10-CM | POA: Insufficient documentation

## 2019-05-13 DIAGNOSIS — E0580 Other thyrotoxicosis without thyrotoxic crisis or storm: Secondary | ICD-10-CM | POA: Insufficient documentation

## 2019-05-13 DIAGNOSIS — G4733 Obstructive sleep apnea (adult) (pediatric): Secondary | ICD-10-CM | POA: Insufficient documentation

## 2019-05-13 DIAGNOSIS — F329 Major depressive disorder, single episode, unspecified: Secondary | ICD-10-CM | POA: Diagnosis not present

## 2019-05-13 DIAGNOSIS — J45909 Unspecified asthma, uncomplicated: Secondary | ICD-10-CM | POA: Diagnosis not present

## 2019-05-13 DIAGNOSIS — Z79899 Other long term (current) drug therapy: Secondary | ICD-10-CM | POA: Diagnosis not present

## 2019-05-13 DIAGNOSIS — E785 Hyperlipidemia, unspecified: Secondary | ICD-10-CM | POA: Diagnosis not present

## 2019-05-13 DIAGNOSIS — I10 Essential (primary) hypertension: Secondary | ICD-10-CM | POA: Insufficient documentation

## 2019-05-13 DIAGNOSIS — K219 Gastro-esophageal reflux disease without esophagitis: Secondary | ICD-10-CM | POA: Diagnosis not present

## 2019-05-13 HISTORY — PX: KNEE ARTHROSCOPY WITH MEDIAL MENISECTOMY: SHX5651

## 2019-05-13 SURGERY — ARTHROSCOPY, KNEE, WITH MEDIAL MENISCECTOMY
Anesthesia: General | Site: Knee | Laterality: Left

## 2019-05-13 MED ORDER — MIDAZOLAM HCL 2 MG/2ML IJ SOLN
INTRAMUSCULAR | Status: AC
  Filled 2019-05-13: qty 2

## 2019-05-13 MED ORDER — LACTATED RINGERS IV SOLN
INTRAVENOUS | Status: DC
  Administered 2019-05-13 (×2): via INTRAVENOUS

## 2019-05-13 MED ORDER — PROPOFOL 10 MG/ML IV BOLUS
INTRAVENOUS | Status: DC | PRN
  Administered 2019-05-13: 200 mg via INTRAVENOUS

## 2019-05-13 MED ORDER — PHENYLEPHRINE HCL (PRESSORS) 10 MG/ML IV SOLN
INTRAVENOUS | Status: DC | PRN
  Administered 2019-05-13 (×2): 100 ug via INTRAVENOUS

## 2019-05-13 MED ORDER — BUPIVACAINE HCL (PF) 0.5 % IJ SOLN
INTRAMUSCULAR | Status: AC
  Filled 2019-05-13: qty 30

## 2019-05-13 MED ORDER — GLYCOPYRROLATE 0.2 MG/ML IJ SOLN
INTRAMUSCULAR | Status: DC | PRN
  Administered 2019-05-13: 0.2 mg via INTRAVENOUS

## 2019-05-13 MED ORDER — OXYCODONE HCL 5 MG PO TABS: 5.0000 mg | ORAL_TABLET | Freq: Once | ORAL | Status: DC | PRN

## 2019-05-13 MED ORDER — EPINEPHRINE PF 1 MG/ML IJ SOLN
INTRAMUSCULAR | Status: AC
  Filled 2019-05-13: qty 1

## 2019-05-13 MED ORDER — LIDOCAINE HCL (CARDIAC) PF 100 MG/5ML IV SOSY
PREFILLED_SYRINGE | INTRAVENOUS | Status: DC | PRN
  Administered 2019-05-13: 100 mg via INTRAVENOUS

## 2019-05-13 MED ORDER — OXYCODONE HCL 5 MG/5ML PO SOLN: 5.0000 mg | Freq: Once | ORAL | Status: DC | PRN

## 2019-05-13 MED ORDER — ONDANSETRON HCL 4 MG/2ML IJ SOLN
INTRAMUSCULAR | Status: DC | PRN
  Administered 2019-05-13: 4 mg via INTRAVENOUS

## 2019-05-13 MED ORDER — BUPIVACAINE-EPINEPHRINE (PF) 0.5% -1:200000 IJ SOLN
INTRAMUSCULAR | Status: DC | PRN
  Administered 2019-05-13: 20 mL via PERINEURAL
  Administered 2019-05-13: 60 mL via PERINEURAL

## 2019-05-13 MED ORDER — LIDOCAINE HCL (PF) 1 % IJ SOLN
INTRAMUSCULAR | Status: AC
  Filled 2019-05-13: qty 30

## 2019-05-13 MED ORDER — ACETAMINOPHEN NICU IV SYRINGE 10 MG/ML
INTRAVENOUS | Status: AC
  Filled 2019-05-13: qty 1

## 2019-05-13 MED ORDER — FENTANYL CITRATE (PF) 100 MCG/2ML IJ SOLN: 25.0000 ug | INTRAMUSCULAR | Status: DC | PRN

## 2019-05-13 MED ORDER — DEXAMETHASONE SODIUM PHOSPHATE 10 MG/ML IJ SOLN
INTRAMUSCULAR | Status: DC | PRN
  Administered 2019-05-13: 10 mg via INTRAVENOUS

## 2019-05-13 MED ORDER — FENTANYL CITRATE (PF) 100 MCG/2ML IJ SOLN
INTRAMUSCULAR | Status: AC
  Filled 2019-05-13: qty 2

## 2019-05-13 MED ORDER — ACETAMINOPHEN 10 MG/ML IV SOLN
INTRAVENOUS | Status: DC | PRN
  Administered 2019-05-13: 1000 mg via INTRAVENOUS

## 2019-05-13 MED ORDER — MIDAZOLAM HCL 2 MG/2ML IJ SOLN
INTRAMUSCULAR | Status: DC | PRN
  Administered 2019-05-13: 2 mg via INTRAVENOUS

## 2019-05-13 MED ORDER — HYDROCODONE-ACETAMINOPHEN 5-325 MG PO TABS: 1.0000 | ORAL_TABLET | Freq: Four times a day (QID) | ORAL | 0 refills | Status: DC | PRN

## 2019-05-13 MED ORDER — CHLORHEXIDINE GLUCONATE 4 % EX LIQD: 60.0000 mL | Freq: Once | CUTANEOUS | Status: DC

## 2019-05-13 MED ORDER — FENTANYL CITRATE (PF) 100 MCG/2ML IJ SOLN
INTRAMUSCULAR | Status: DC | PRN
  Administered 2019-05-13 (×2): 25 ug via INTRAVENOUS

## 2019-05-13 MED ORDER — CEFAZOLIN SODIUM-DEXTROSE 2-4 GM/100ML-% IV SOLN
INTRAVENOUS | Status: AC
  Filled 2019-05-13: qty 100

## 2019-05-13 SURGICAL SUPPLY — 36 items
BAG COUNTER SPONGE EZ (MISCELLANEOUS) IMPLANT
BLADE FULL RADIUS 3.5 (BLADE) ×2 IMPLANT
BLADE SHAVER 4.5X7 STR FR (MISCELLANEOUS) ×2 IMPLANT
BNDG ELASTIC 6X5.8 VLCR STR LF (GAUZE/BANDAGES/DRESSINGS) ×2 IMPLANT
CHLORAPREP W/TINT 26 (MISCELLANEOUS) ×2 IMPLANT
COVER WAND RF STERILE (DRAPES) ×2 IMPLANT
CUFF TOURN SGL QUICK 24 (TOURNIQUET CUFF)
CUFF TOURN SGL QUICK 30 (TOURNIQUET CUFF) ×1
CUFF TRNQT CYL 24X4X16.5-23 (TOURNIQUET CUFF) IMPLANT
CUFF TRNQT CYL 30X4X21-28X (TOURNIQUET CUFF) ×1 IMPLANT
DRAPE SPLIT 6X30 W/TAPE (DRAPES) ×2 IMPLANT
ELECT REM PT RETURN 9FT ADLT (ELECTROSURGICAL) ×2
ELECTRODE REM PT RTRN 9FT ADLT (ELECTROSURGICAL) ×1 IMPLANT
GAUZE SPONGE 4X4 12PLY STRL (GAUZE/BANDAGES/DRESSINGS) ×2 IMPLANT
GAUZE XEROFORM 1X8 LF (GAUZE/BANDAGES/DRESSINGS) ×2 IMPLANT
GLOVE BIO SURGEON STRL SZ8 (GLOVE) ×4 IMPLANT
GLOVE BIOGEL M 7.0 STRL (GLOVE) ×4 IMPLANT
GLOVE BIOGEL PI IND STRL 7.5 (GLOVE) ×1 IMPLANT
GLOVE BIOGEL PI INDICATOR 7.5 (GLOVE) ×1
GLOVE INDICATOR 8.0 STRL GRN (GLOVE) ×2 IMPLANT
GOWN STRL REUS W/ TWL LRG LVL3 (GOWN DISPOSABLE) ×1 IMPLANT
GOWN STRL REUS W/ TWL XL LVL3 (GOWN DISPOSABLE) ×2 IMPLANT
GOWN STRL REUS W/TWL LRG LVL3 (GOWN DISPOSABLE) ×1
GOWN STRL REUS W/TWL XL LVL3 (GOWN DISPOSABLE) ×2
IV LACTATED RINGER IRRG 3000ML (IV SOLUTION) ×1
IV LR IRRIG 3000ML ARTHROMATIC (IV SOLUTION) ×1 IMPLANT
KIT TURNOVER KIT A (KITS) ×2 IMPLANT
MANIFOLD NEPTUNE II (INSTRUMENTS) ×2 IMPLANT
NEEDLE HYPO 21X1.5 SAFETY (NEEDLE) ×2 IMPLANT
PACK ARTHROSCOPY KNEE (MISCELLANEOUS) ×2 IMPLANT
PENCIL ELECTRO HAND CTR (MISCELLANEOUS) ×2 IMPLANT
SUT PROLENE 4 0 PS 2 18 (SUTURE) ×2 IMPLANT
SUT TICRON COATED BLUE 2 0 30 (SUTURE) IMPLANT
SYR 50ML LL SCALE MARK (SYRINGE) ×2 IMPLANT
TUBING ARTHRO INFLOW-ONLY STRL (TUBING) ×2 IMPLANT
WAND WEREWOLF FLOW 90D (MISCELLANEOUS) ×2 IMPLANT

## 2019-05-13 NOTE — Anesthesia Preprocedure Evaluation (Signed)
Anesthesia Evaluation  Patient identified by MRN, date of birth, ID band Patient awake    Reviewed: Allergy & Precautions, H&P , NPO status , Patient's Chart, lab work & pertinent test results  History of Anesthesia Complications (+) PROLONGED EMERGENCE, Family history of anesthesia reaction and history of anesthetic complications  Airway Mallampati: III  TM Distance: >3 FB Neck ROM: full    Dental  (+) Chipped   Pulmonary neg shortness of breath, asthma , sleep apnea ,  OSA just started on cpap.          Cardiovascular Exercise Tolerance: Good hypertension, On Medications (-) angina(-) Past MI + dysrhythmias      Neuro/Psych  Headaches, PSYCHIATRIC DISORDERS    GI/Hepatic Neg liver ROS, GERD  Medicated and Controlled,  Endo/Other  Hyperthyroidism   Renal/GU negative Renal ROS     Musculoskeletal  (+) Arthritis ,   Abdominal   Peds  Hematology negative hematology ROS (+)   Anesthesia Other Findings Past Medical History: No date: Anginal pain (HCC) No date: Arthritis No date: Asthma No date: Chronic headaches No date: Complication of anesthesia     Comment:  takes longer to wake up No date: Depression No date: Dyspnea No date: Dysrhythmia No date: Family history of adverse reaction to anesthesia     Comment:  son takes longer to wake up than a normal person No date: GERD (gastroesophageal reflux disease) No date: Hyperlipidemia No date: Hypertension No date: Sleep apnea No date: Subclinical hyperthyroidism  Past Surgical History: No date: ANKLE FRACTURE SURGERY No date: ANKLE FUSION     Comment:  [facility] No date: CARDIAC CATHETERIZATION No date: FEMUR FRACTURE SURGERY No date: FRACTURE SURGERY 07/30/2018: I&D EXTREMITY; Right     Comment:  Procedure: DEBRIDEMENT OF THE COMMON EXTENSOR ORIGIN OF               ELBOW;  Surgeon: Corky Mull, MD;  Location: [place];  Service:  Orthopedics;  Laterality: Right;               1.45 BIOMET JUGGERKNOT ANCHORS 01/08/2017: LEFT HEART CATH AND CORONARY ANGIOGRAPHY; N/A     Comment:  Procedure: Left Heart Cath and Coronary Angiography;                Surgeon: Nelva Bush, MD;  Location: [place]               CV [facility];  Service: Cardiovascular;  Laterality: N/A; No date: MANDIBLE FRACTURE SURGERY 04/17/2018: ROBOT ASSISTED INGUINAL HERNIA REPAIR; Bilateral     Comment:  Procedure: ROBOT ASSISTED INGUINAL HERNIA REPAIR;                Surgeon: Jules Husbands, MD;  Location: [facility];                Service: General;  Laterality: Bilateral;  BMI    Body Mass Index: 32.89 kg/m      Reproductive/Obstetrics negative OB ROS                             Anesthesia Physical  Anesthesia Plan  ASA: III  Anesthesia Plan: General LMA   Post-op Pain Management:    Induction: Intravenous  PONV Risk Score and Plan: Dexamethasone, Ondansetron, Midazolam and Treatment may vary due to age or medical condition  Airway Management Planned:  LMA  Additional Equipment:   Intra-op Plan:   Post-operative Plan: Extubation in OR  Informed Consent: I have reviewed the patients History and Physical, chart, labs and discussed the procedure including the risks, benefits and alternatives for the proposed anesthesia with the patient or authorized representative who has indicated his/her understanding and acceptance.     Dental Advisory Given  Plan Discussed with: Anesthesiologist, CRNA and Surgeon  Anesthesia Plan Comments: (Patient consented for risks of anesthesia including but not limited to:  - adverse reactions to medications - damage to teeth, lips or other oral mucosa - sore throat or hoarseness - Damage to heart, brain, lungs or loss of life  Patient voiced understanding.)        Anesthesia Quick Evaluation

## 2019-05-13 NOTE — Anesthesia Procedure Notes (Signed)
Procedure Name: LMA Insertion Performed by: Kelton Pillar, CRNA Pre-anesthesia Checklist: Patient identified, Emergency Drugs available, Suction available and Patient being monitored Patient Re-evaluated:Patient Re-evaluated prior to induction Oxygen Delivery Method: Circle system utilized Preoxygenation: Pre-oxygenation with 100% oxygen Induction Type: IV induction Ventilation: Two handed mask ventilation required LMA: LMA inserted Tube type: Oral Placement Confirmation: positive ETCO2,  CO2 detector and breath sounds checked- equal and bilateral Tube secured with: Tape Dental Injury: Teeth and Oropharynx as per pre-operative assessment

## 2019-05-13 NOTE — Op Note (Signed)
05/13/2019  11:00 AM  Patient:   Wesley Bridges  Pre-Op Diagnosis:   Chondromalacia patella with possible medial meniscus tear, left knee.  Postoperative diagnosis:   Chondromalacia patella, left knee.  Procedure:   Left knee arthroscopy with debridement and abrasion chondroplasty of grade III chondromalacia patella, left knee.  Surgeon:   Pascal Lux, MD  Assistant:   Larrie Kass, PA-S  Anesthesia:   General LMA.  Findings:   As above. The medial and lateral menisci both were in excellent condition, as were the anterior and posterior cruciate ligaments. There were grade 2 chondromalacial changes involving the lateral tibial plateau. The remaining articular surfaces all were in satisfactory condition.  Complications:   None.  EBL:   10 cc.  Total fluids:   800 cc of crystalloid.  Tourniquet time:   None  Drains:   None  Closure:   4-0 Prolene interrupted sutures.  Brief clinical note:   The patient is a 48 year old male with a history of bilateral knee pain, left more symptomatic than right. His symptoms have persisted despite medications, activity modification, a home exercise program, etc. His history and examination were concerning for chondromalacia patella as well as a medial meniscus tear, which was equivocal on MRI scan. The patient presents at this time for arthroscopy, debridement, and possible repair versus partial medial meniscectomy.  Procedure:   The patient was brought into the operating room and lain in the supine position. After adequate general laryngeal mask anesthesia was obtained, a timeout was performed to verify the appropriate side. The patient's left knee was injected sterilely using a solution of 30 cc of 1% lidocaine and 30 cc of 0.5% Sensorcaine with epinephrine. The left lower extremity was prepped with ChloraPrep solution before being draped sterilely. Preoperative antibiotics were administered. The expected portal sites were injected with  0.5% Sensorcaine with epinephrine before the camera was placed in the anterolateral portal and instrumentation performed through the anteromedial portal.   The knee was sequentially examined beginning in the suprapatellar pouch, then progressing to the patellofemoral space, the medial gutter and compartment, the notch, and finally the lateral compartment and gutter. The findings were as described above. Abundant reactive synovial tissues anteriorly were debrided using the full-radius resector in order to improve visualization. The medial and lateral menisci were carefully probed with the findings as described above. The area of grade III chondromalacia patella was debrided back to stable margins using the full-radius resector. The camera was repositioned in the medial portal and the shaver introduced to the lateral portal in order to optimally access this area which measured approximately 1.2-1.4 cm in diameter and involve the superior portion of the central ridge of the patella. The ArthroCare werewolf wand was then inserted and used to lightly anneal the chondral surfaces without actually touching the surfaces. The instruments were removed from the joint after suctioning the excess fluid.   The portal sites were closed using 4-0 Prolene interrupted sutures before a sterile bulky dressing was applied to the knee. The patient was then awakened, extubated, and returned to the recovery room in satisfactory condition after tolerating the procedure well.

## 2019-05-13 NOTE — H&P (Signed)
Chief Complaint:  Left knee pain.  HPI: The patient is a 48 year old male with a history of progressively worsening bilateral knee pain, left more symptomatic than right.  His knee pain is aggravated by ascending/descending stairs, as well as when getting up from a seated position.  He also has difficulty with any prolonged standing or ambulation.  His symptoms have persisted despite medications, activity modification, etc.  His history and examination are consistent with chondromalacia patella as well as a possible medial meniscus tear which was confirmed by MRI scan.  The patient presents at this time for a left knee arthroscopy with debridement and repair versus partial medial meniscectomy.  Past Medical History:  Diagnosis Date  . Anginal pain ([place])   . Arthritis   . Asthma   . Chronic headaches   . Complication of anesthesia    takes longer to wake up  . Depression   . Dyspnea   . Dysrhythmia   . Family history of adverse reaction to anesthesia    son takes longer to wake up than a normal person  . GERD (gastroesophageal reflux disease)   . Hyperlipidemia   . Hypertension   . Sleep apnea   . Subclinical hyperthyroidism     Past Surgical History:  Procedure Laterality Date  . ANKLE FRACTURE SURGERY    . ANKLE FUSION     [facility]  . CARDIAC CATHETERIZATION    . FEMUR FRACTURE SURGERY    . FRACTURE SURGERY    . I&D EXTREMITY Right 07/30/2018   Procedure: DEBRIDEMENT OF THE COMMON EXTENSOR ORIGIN OF ELBOW;  Surgeon: Corky Mull, MD;  Location: [place];  Service: Orthopedics;  Laterality: Right;  1.45 BIOMET JUGGERKNOT ANCHORS  . LEFT HEART CATH AND CORONARY ANGIOGRAPHY N/A 01/08/2017   Procedure: Left Heart Cath and Coronary Angiography;  Surgeon: Nelva Bush, MD;  Location: [place] [facility];  Service: Cardiovascular;  Laterality: N/A;  . MANDIBLE FRACTURE SURGERY    . ROBOT ASSISTED INGUINAL HERNIA REPAIR Bilateral 04/17/2018   Procedure: ROBOT ASSISTED INGUINAL  HERNIA REPAIR;  Surgeon: Jules Husbands, MD;  Location: [facility];  Service: General;  Laterality: Bilateral;    Family History  Problem Relation Age of Onset  . Arthritis Other        parents  . Hypertension Mother   . Thyroid disease Mother   . Osteoporosis Mother   . Syncope episode Mother   . Hypertension Brother    Social History:  reports that he has never smoked. He has never used smokeless tobacco. He reports current alcohol use of about 14.0 standard drinks of alcohol per week. He reports that he does not use drugs.  Allergies:  Allergies  Allergen Reactions  . Bee Pollen Other (See Comments)  . Pollen Extract     Medications Prior to Admission  Medication Sig Dispense Refill  . atorvastatin (LIPITOR) 40 MG tablet Take 40 mg by mouth daily.    Marland Kitchen BYSTOLIC 10 MG tablet TAKE 1 TABLET DAILY 90 tablet 3  . fluticasone (FLONASE) 50 MCG/ACT nasal spray Place 2 sprays into both nostrils daily. 18 g 11  . HYDROcodone-acetaminophen (NORCO/VICODIN) 5-325 MG tablet Take 1-2 tablets by mouth every 6 (six) hours as needed for moderate pain. 40 tablet 0  . hydroxychloroquine (PLAQUENIL) 200 MG tablet Take 200 mg by mouth 2 (two) times daily.    Marland Kitchen lisinopril (ZESTRIL) 20 MG tablet Take 20 mg by mouth daily.    Marland Kitchen loratadine (CLARITIN) 10 MG  tablet Take 1 tablet (10 mg total) by mouth daily. (Patient taking differently: Take 20 mg by mouth daily. ) 30 tablet 11  . Multiple Vitamin (MULTIVITAMIN WITH MINERALS) TABS tablet Take 1 tablet by mouth at bedtime.    . nortriptyline (PAMELOR) 10 MG capsule Take 50 mg by mouth See admin instructions. Take 10mg  by mouth for one week, then increase to 20mg  nightly     . pantoprazole (PROTONIX) 40 MG tablet Take 1 tablet (40 mg total) by mouth daily. 90 tablet 3  . topiramate (TOPAMAX) 50 MG tablet Take 50 mg by mouth as directed. Take 1 tablet in the AM and 2 1/2 tablets in the evening    . venlafaxine (EFFEXOR) 75 MG tablet Take 75 mg by mouth 2 (two)  times daily.    . AJOVY 225 MG/1.5ML SOSY     . aspirin EC 81 MG tablet Take 81 mg by mouth daily.    Marland Kitchen ibuprofen (ADVIL) 800 MG tablet Take 800 mg by mouth 3 (three) times daily as needed.    . nitroGLYCERIN (NITROSTAT) 0.4 MG SL tablet Place 1 tablet (0.4 mg total) under the tongue every 5 (five) minutes as needed for chest pain. 25 tablet 0    Results for orders placed or performed during the hospital encounter of 05/11/19 (from the past 48 hour(s))  SARS CORONAVIRUS 2 (TAT 6-24 HRS) Nasopharyngeal Nasopharyngeal Swab     Status: None   Collection Time: 05/11/19 10:04 AM   Specimen: Nasopharyngeal Swab  Result Value Ref Range   SARS Coronavirus 2 NEGATIVE NEGATIVE    Comment: (NOTE) SARS-CoV-2 target nucleic acids are NOT DETECTED. The SARS-CoV-2 RNA is generally detectable in upper and lower respiratory specimens during the acute phase of infection. Negative results do not preclude SARS-CoV-2 infection, do not rule out co-infections with other pathogens, and should not be used as the sole basis for treatment or other patient management decisions. Negative results must be combined with clinical observations, patient history, and epidemiological information. The expected result is Negative. Fact Sheet for Patients: SugarRoll.be Fact Sheet for Healthcare Providers: https://www.woods-mathews.com/ This test is not yet approved or cleared by the [place] FDA and  has been authorized for detection and/or diagnosis of SARS-CoV-2 by FDA under an Emergency Use Authorization (EUA). This EUA will remain  in effect (meaning this test can be used) for the duration of the COVID-19 declaration under Section 56 4(b)(1) of the Act, 21 U.S.C. section 360bbb-3(b)(1), unless the authorization is terminated or revoked sooner. Performed at [facility], [place] [address], [place], [place] [address]    No results found.  ROS: A comprehensive 14  point ROS was performed, reviewed, and the pertinent orthopedic findings are documented in the HPI.  Blood pressure 116/68, pulse 68, temperature 97.6 F (36.4 C), temperature source Temporal, resp. rate 16, height 5\' 7"  (1.702 m), weight 95.3 kg, SpO2 100 %.   Physical Exam: Orthopedic examination is limited to the left knee. ALIGNMENT:Normal SKIN:Well-healed anterolateral scars, otherwise unremarkable SWELLING:Minimal EFFUSION:Trace WARMTH:None TENDERNESS:Mild medial joint line tenderness, no lateral joint line tenderness ROM:0-130 degrees with minimal discomfort in maximal flexion McMURRAY'S: Equivocally positive PATELLOFEMORAL:Normal patella tracking with mild peripatellar tenderness and negative apprehension sign CREPITUS:Mild patellofemoral crepitance LACHMAN'S:Negative PIVOT SHIFT:Negative ANTERIOR DRAWER:Negative POSTERIOR DRAWER:Negative VARUS/VALGUS:Stable  He is neurovascularly intact to theleft lower extremity and foot.   X-ray data: A recent MRI scan of the left knee is available for review. By report, the scan demonstrates evidence of a "nondisplaced  undersurface tear. of the posterior medial meniscus with undersurface fraying". It also demonstrates evidence of "chondral fissuring" on the central ridge of the patella. No ligamentous pathology or lateral meniscal pathology is noted.  Assessment/Plan The treatment options have been discussed with the patient, including both surgical nonsurgical choices.  The patient is ready to proceed with a left knee arthroscopy with debridement and repair versus partial medial meniscectomy.  The procedure has been discussed in detail with the patient, as have the potential risks (including bleeding, infection, nerve and/or blood vessel injury, persistent or recurrent pain, stiffness of the knee, need for further surgery, blood clots, strokes, heart attacks and/or arrhythmias, etc.) and benefits.  The patient states his  understanding and wishes to proceed.  A formal written consent has been signed.   Corky Mull, MD 05/13/2019, 9:28 AM

## 2019-05-13 NOTE — Anesthesia Postprocedure Evaluation (Signed)
Anesthesia Post Note  Patient: Wesley Bridges  Procedure(s) Performed: KNEE ARTHROSCOPY WITH DEBRIDEMENT AND REPAIR CH (Left Knee)  Patient location during evaluation: PACU Anesthesia Type: General Level of consciousness: awake and alert Pain management: pain level controlled Vital Signs Assessment: post-procedure vital signs reviewed and stable Respiratory status: spontaneous breathing, nonlabored ventilation, respiratory function stable and patient connected to nasal cannula oxygen Cardiovascular status: blood pressure returned to baseline and stable Postop Assessment: no apparent nausea or vomiting Anesthetic complications: no     Last Vitals:  Vitals:   05/13/19 1104 05/13/19 1119  BP: 124/69 117/67  Pulse: 78 72  Resp: 14 15  Temp:    SpO2: 100% 100%    Last Pain:  Vitals:   05/13/19 1049  TempSrc:   PainSc: Asleep                 Precious Haws Piscitello

## 2019-05-13 NOTE — Transfer of Care (Signed)
Immediate Anesthesia Transfer of Care Note  Patient: Wesley Bridges  Procedure(s) Performed: KNEE ARTHROSCOPY WITH DEBRIDEMENT AND REPAIR VERSUS PARTIAL MEDIAL MENISCECTOMY. (Left Knee)  Patient Location: PACU  Anesthesia Type:General  Level of Consciousness: awake, drowsy and responds to stimulation  Airway & Oxygen Therapy: Patient Spontanous Breathing and Patient connected to face mask oxygen  Post-op Assessment: Report given to RN and Post -op Vital signs reviewed and stable  Post vital signs: Reviewed and stable  Last Vitals:  Vitals Value Taken Time  BP 110/69 05/13/19 1049  Temp 36.6 C 05/13/19 1049  Pulse 80 05/13/19 1050  Resp 19 05/13/19 1050  SpO2 100 % 05/13/19 1050  Vitals shown include unvalidated device data.  Last Pain:  Vitals:   05/13/19 1049  TempSrc:   PainSc: (P) Asleep         Complications: No apparent anesthesia complications

## 2019-05-13 NOTE — Discharge Instructions (Addendum)
Orthopedic discharge instructions: Keep dressing dry and intact.  May shower after dressing changed on post-op day #4 (Sunday).  Cover sutures with Band-Aids after drying off. Apply ice frequently to knee. Take ibuprofen 800 mg TID with meals for 7-10 days, then as necessary. Take pain medication as prescribed or ES Tylenol when needed.  May weight-bear as tolerated - use crutches or walker as needed. Follow-up in 10-14 days or as scheduled.   AMBULATORY SURGERY  DISCHARGE INSTRUCTIONS   1) The drugs that you were given will stay in your system until tomorrow so for the next 24 hours you should not:  A) Drive an automobile B) Make any legal decisions C) Drink any alcoholic beverage   2) You may resume regular meals tomorrow.  Today it is better to start with liquids and gradually work up to solid foods.  You may eat anything you prefer, but it is better to start with liquids, then soup and crackers, and gradually work up to solid foods.   3) Please notify your doctor immediately if you have any unusual bleeding, trouble breathing, redness and pain at the surgery site, drainage, fever, or pain not relieved by medication.    4) Additional Instructions:        Please contact your physician with any problems or Same Day Surgery at [phone], Monday through Friday 6 am to 4 pm, or [place] at [facility] number at [phone].

## 2019-05-13 NOTE — Anesthesia Post-op Follow-up Note (Signed)
Anesthesia QCDR form completed.        

## 2019-05-14 ENCOUNTER — Encounter: Payer: Self-pay | Admitting: Surgery

## 2019-06-16 ENCOUNTER — Ambulatory Visit: Payer: 59 | Admitting: Family Medicine

## 2019-07-22 ENCOUNTER — Other Ambulatory Visit: Payer: Self-pay | Admitting: Family Medicine

## 2019-07-22 NOTE — Telephone Encounter (Signed)
Call pt Please clarify his lisinopril dose- is he on 20 or 40 mg?  We got a refill request for 40 mg however on his chart it appears he is on 20?    Confirm atorvastatin dose as well.

## 2019-07-29 ENCOUNTER — Other Ambulatory Visit: Payer: Self-pay

## 2019-07-29 MED ORDER — ATORVASTATIN CALCIUM 40 MG PO TABS: 40.0000 mg | ORAL_TABLET | Freq: Every day | ORAL | 3 refills | Status: DC

## 2019-07-30 NOTE — Telephone Encounter (Signed)
Please contact him and see how long he has been on 40 mg of lisinopril. The last documentation I have is that he was on lisinopril 20 mg daily. Thanks.

## 2019-08-03 ENCOUNTER — Telehealth: Payer: Self-pay

## 2019-08-04 MED ORDER — CARVEDILOL 25 MG PO TABS: 25.0000 mg | ORAL_TABLET | Freq: Two times a day (BID) | ORAL | 3 refills | Status: DC

## 2019-08-04 NOTE — Telephone Encounter (Signed)
Can you call and schedule a  nurse visit for BP and pulse check I called and lvm.  Alea Ryer,cma

## 2019-08-04 NOTE — Telephone Encounter (Signed)
Patient is willing to try the medication and you can send to the pharmacy.  Breindel Collier,cma

## 2019-08-04 NOTE — Telephone Encounter (Signed)
Sent to pharmacy.  Patient needs a BP check and pulse check 2 to 3 weeks after starting on this medication.  Thanks.

## 2019-08-04 NOTE — Telephone Encounter (Signed)
Please let the patient know that his insurance will no longer pay for the Bystolic.  I like to switch him to carvedilol 25 mg twice daily by mouth.  If he is willing to do that I can send it to his pharmacy.  Thanks.

## 2019-08-04 NOTE — Telephone Encounter (Signed)
I will forward to Catie to help determine what the equivalent dose of carvedilol is relating to his current dose of Bystolic.  Once I know this we can get a new medication sent in for him.

## 2019-08-04 NOTE — Telephone Encounter (Signed)
Would go from Bystolic 10 daily to carvedilol 25 mg BID.

## 2019-08-05 NOTE — Telephone Encounter (Signed)
LVMTCB to set up BP/Pulse check

## 2019-08-06 NOTE — Telephone Encounter (Signed)
Patient stated he does not remember the last time he took 20 MG. He has been on 40 MG for quiet some time. Like months & months he said.

## 2019-08-07 DIAGNOSIS — M24021 Loose body in right elbow: Secondary | ICD-10-CM | POA: Insufficient documentation

## 2019-08-20 ENCOUNTER — Other Ambulatory Visit: Payer: Self-pay | Admitting: Surgery

## 2019-08-25 ENCOUNTER — Encounter
Admission: RE | Admit: 2019-08-25 | Discharge: 2019-08-25 | Disposition: A | Discharge status: Home / Self Care | Payer: 59 | Source: Ambulatory Visit | Attending: Surgery | Admitting: Surgery

## 2019-08-25 ENCOUNTER — Other Ambulatory Visit: Payer: Self-pay

## 2019-08-25 NOTE — Patient Instructions (Signed)
Your procedure is scheduled on: Tues 3/9 Report to Day Surgery. [facility] To find out your arrival time please call [phone] between 1PM - 3PM on Mon 3/8  Remember: Instructions that are not followed completely may result in serious medical risk,  up to and including death, or upon the discretion of your surgeon and anesthesiologist your  surgery may need to be rescheduled.     _X__ 1. Do not eat food after midnight the night before your procedure.                 No gum chewing or hard candies. You may drink clear liquids up to 2 hours                 before you are scheduled to arrive for your surgery- DO not drink clear                 liquids within 2 hours of the start of your surgery.                 Clear Liquids include:  water, apple juice without pulp, clear Gatorade, G2 or                  Gatorade Zero (avoid Red/Purple/Blue), Black Coffee or Tea (Do not add                 anything to coffee or tea). _____2.   Complete the carbohydrate drink provided to you, 2 hours before arrival.  __X__2.  On the morning of surgery brush your teeth with toothpaste and water, you                may rinse your mouth with mouthwash if you wish.  Do not swallow any toothpaste of mouthwash.     _X__ 3.  No Alcohol for 24 hours before or after surgery.   ___ 4.  Do Not Smoke or use e-cigarettes For 24 Hours Prior to Your Surgery.                 Do not use any chewable tobacco products for at least 6 hours prior to                 surgery.  ____  5.  Bring all medications with you on the day of surgery if instructed.   __x__  6.  Notify your doctor if there is any change in your medical condition      (cold, fever, infections).     Do not wear jewelry,  Do not wear lotions,  Do not shave 48 hours prior to surgery. Men may shave face and neck. Do not bring valuables to the hospital.    [facility] is not responsible for any belongings or  valuables.  Contacts, dentures or bridgework may not be worn into surgery. Leave your suitcase in the car. After surgery it may be brought to your room. For patients admitted to the hospital, discharge time is determined by your treatment team.   Patients discharged the day of surgery will not be allowed to drive home.   Make arrangements for someone to be with you for the first 24 hours of your Same Day Discharge.    Please read over the following fact sheets that you were given:      _x___ Take these medicines the morning of surgery with A SIP OF WATER:    1. carvedilol (COREG) 25 MG tablet  2. pantoprazole (PROTONIX) 40 MG tablet  Take dose the night before and the morning of surgery  3. topiramate (TOPAMAX) 50 MG tablet  4.venlafaxine (EFFEXOR) 75 MG tablet  5.  6.  ____ Fleet Enema (as directed)   _x___ Use CHG Soap (or wipes) as directed  ____ Use Benzoyl Peroxide Gel as instructed  ____ Use inhalers on the day of surgery  ____ Stop metformin 2 days prior to surgery    ____ Take 1/2 of usual insulin dose the night before surgery. No insulin the morning          of surgery.   _x___ Stop aspirin today  __x__ Stop Anti-inflammatories ibuprofen (ADVIL) 800 MG tablet aleve or aspirin today     May take tylenol    ____ Stop supplements until after surgery.    _x___ Use  C-Pap  During the day of your surgery while napping to help with deep breaths.

## 2019-08-28 ENCOUNTER — Other Ambulatory Visit
Admission: RE | Admit: 2019-08-28 | Discharge: 2019-08-28 | Disposition: A | Discharge status: Home / Self Care | Payer: 59 | Source: Ambulatory Visit | Attending: Surgery | Admitting: Surgery

## 2019-08-28 ENCOUNTER — Other Ambulatory Visit: Payer: Self-pay

## 2019-08-28 DIAGNOSIS — Z01812 Encounter for preprocedural laboratory examination: Secondary | ICD-10-CM | POA: Insufficient documentation

## 2019-08-28 DIAGNOSIS — Z20822 Contact with and (suspected) exposure to covid-19: Secondary | ICD-10-CM | POA: Insufficient documentation

## 2019-08-28 LAB — SARS CORONAVIRUS 2 (TAT 6-24 HRS): SARS Coronavirus 2: NEGATIVE

## 2019-09-01 ENCOUNTER — Ambulatory Visit: Payer: 59 | Admitting: Anesthesiology

## 2019-09-01 ENCOUNTER — Ambulatory Visit
Admission: RE | Admit: 2019-09-01 | Discharge: 2019-09-01 | Disposition: A | Discharge status: Home / Self Care | Payer: 59 | Attending: Surgery | Admitting: Surgery

## 2019-09-01 ENCOUNTER — Encounter
Admission: RE | Disposition: A | Discharge status: Home / Self Care | Payer: Self-pay | Source: Home / Self Care | Attending: Surgery

## 2019-09-01 ENCOUNTER — Other Ambulatory Visit: Payer: Self-pay

## 2019-09-01 ENCOUNTER — Encounter: Payer: Self-pay | Admitting: Surgery

## 2019-09-01 DIAGNOSIS — M659 Synovitis and tenosynovitis, unspecified: Secondary | ICD-10-CM | POA: Insufficient documentation

## 2019-09-01 DIAGNOSIS — K219 Gastro-esophageal reflux disease without esophagitis: Secondary | ICD-10-CM | POA: Insufficient documentation

## 2019-09-01 DIAGNOSIS — G4730 Sleep apnea, unspecified: Secondary | ICD-10-CM | POA: Insufficient documentation

## 2019-09-01 DIAGNOSIS — Z7982 Long term (current) use of aspirin: Secondary | ICD-10-CM | POA: Insufficient documentation

## 2019-09-01 DIAGNOSIS — M19021 Primary osteoarthritis, right elbow: Secondary | ICD-10-CM | POA: Insufficient documentation

## 2019-09-01 DIAGNOSIS — E7800 Pure hypercholesterolemia, unspecified: Secondary | ICD-10-CM | POA: Insufficient documentation

## 2019-09-01 DIAGNOSIS — I10 Essential (primary) hypertension: Secondary | ICD-10-CM | POA: Insufficient documentation

## 2019-09-01 DIAGNOSIS — M24021 Loose body in right elbow: Secondary | ICD-10-CM | POA: Insufficient documentation

## 2019-09-01 DIAGNOSIS — M94221 Chondromalacia, right elbow: Secondary | ICD-10-CM | POA: Insufficient documentation

## 2019-09-01 DIAGNOSIS — Z79899 Other long term (current) drug therapy: Secondary | ICD-10-CM | POA: Insufficient documentation

## 2019-09-01 DIAGNOSIS — F329 Major depressive disorder, single episode, unspecified: Secondary | ICD-10-CM | POA: Insufficient documentation

## 2019-09-01 DIAGNOSIS — E785 Hyperlipidemia, unspecified: Secondary | ICD-10-CM | POA: Insufficient documentation

## 2019-09-01 HISTORY — PX: ELBOW ARTHROSCOPY: SHX614

## 2019-09-01 SURGERY — ARTHROSCOPY, ELBOW, WITH OPEN SURGERY IF INDICATED
Anesthesia: General | Site: Elbow | Laterality: Right

## 2019-09-01 MED ORDER — PROPOFOL 10 MG/ML IV BOLUS
INTRAVENOUS | Status: AC
  Filled 2019-09-01: qty 40

## 2019-09-01 MED ORDER — SUCCINYLCHOLINE CHLORIDE 20 MG/ML IJ SOLN
INTRAMUSCULAR | Status: DC | PRN
  Administered 2019-09-01: 100 mg via INTRAVENOUS

## 2019-09-01 MED ORDER — ONDANSETRON HCL 4 MG/2ML IJ SOLN: 4.0000 mg | Freq: Four times a day (QID) | INTRAMUSCULAR | Status: DC | PRN

## 2019-09-01 MED ORDER — METOCLOPRAMIDE HCL 5 MG/ML IJ SOLN: 5.0000 mg | Freq: Three times a day (TID) | INTRAMUSCULAR | Status: DC | PRN

## 2019-09-01 MED ORDER — ROCURONIUM BROMIDE 100 MG/10ML IV SOLN
INTRAVENOUS | Status: DC | PRN
  Administered 2019-09-01: 50 mg via INTRAVENOUS

## 2019-09-01 MED ORDER — MIDAZOLAM HCL 2 MG/2ML IJ SOLN
INTRAMUSCULAR | Status: AC
  Filled 2019-09-01: qty 2

## 2019-09-01 MED ORDER — DEXAMETHASONE SODIUM PHOSPHATE 10 MG/ML IJ SOLN
INTRAMUSCULAR | Status: AC
  Filled 2019-09-01: qty 1

## 2019-09-01 MED ORDER — FENTANYL CITRATE (PF) 100 MCG/2ML IJ SOLN: 25.0000 ug | INTRAMUSCULAR | Status: DC | PRN

## 2019-09-01 MED ORDER — FENTANYL CITRATE (PF) 100 MCG/2ML IJ SOLN
INTRAMUSCULAR | Status: DC | PRN
  Administered 2019-09-01: 100 ug via INTRAVENOUS

## 2019-09-01 MED ORDER — DEXMEDETOMIDINE HCL IN NACL 200 MCG/50ML IV SOLN
INTRAVENOUS | Status: DC | PRN
  Administered 2019-09-01 (×2): 4 ug via INTRAVENOUS

## 2019-09-01 MED ORDER — ONDANSETRON HCL 4 MG/2ML IJ SOLN: 4.0000 mg | Freq: Once | INTRAMUSCULAR | Status: DC | PRN

## 2019-09-01 MED ORDER — EPHEDRINE SULFATE 50 MG/ML IJ SOLN
INTRAMUSCULAR | Status: DC | PRN
  Administered 2019-09-01 (×2): 10 mg via INTRAVENOUS
  Administered 2019-09-01: 5 mg via INTRAVENOUS

## 2019-09-01 MED ORDER — SEVOFLURANE IN SOLN
RESPIRATORY_TRACT | Status: AC
  Filled 2019-09-01: qty 250

## 2019-09-01 MED ORDER — FENTANYL CITRATE (PF) 100 MCG/2ML IJ SOLN
INTRAMUSCULAR | Status: AC
  Filled 2019-09-01: qty 2

## 2019-09-01 MED ORDER — POTASSIUM CHLORIDE IN NACL 20-0.9 MEQ/L-% IV SOLN: INTRAVENOUS | Status: DC

## 2019-09-01 MED ORDER — PHENYLEPHRINE HCL (PRESSORS) 10 MG/ML IV SOLN
INTRAVENOUS | Status: DC | PRN
  Administered 2019-09-01 (×3): 100 ug via INTRAVENOUS

## 2019-09-01 MED ORDER — HYDROCODONE-ACETAMINOPHEN 5-325 MG PO TABS: 1.0000 | ORAL_TABLET | Freq: Once | ORAL | Status: DC

## 2019-09-01 MED ORDER — EPINEPHRINE PF 1 MG/ML IJ SOLN
INTRAMUSCULAR | Status: AC
  Filled 2019-09-01: qty 3

## 2019-09-01 MED ORDER — DEXAMETHASONE SODIUM PHOSPHATE 10 MG/ML IJ SOLN
INTRAMUSCULAR | Status: DC | PRN
  Administered 2019-09-01: 10 mg via INTRAVENOUS

## 2019-09-01 MED ORDER — CHLORHEXIDINE GLUCONATE 4 % EX LIQD
60.0000 mL | Freq: Once | CUTANEOUS | Status: AC
  Administered 2019-09-01: 4 via TOPICAL

## 2019-09-01 MED ORDER — LACTATED RINGERS IV SOLN: INTRAVENOUS | Status: DC

## 2019-09-01 MED ORDER — ONDANSETRON HCL 4 MG/2ML IJ SOLN
INTRAMUSCULAR | Status: DC | PRN
  Administered 2019-09-01: 4 mg via INTRAVENOUS

## 2019-09-01 MED ORDER — CEFAZOLIN SODIUM-DEXTROSE 2-4 GM/100ML-% IV SOLN
2.0000 g | INTRAVENOUS | Status: AC
  Administered 2019-09-01: 2 g via INTRAVENOUS

## 2019-09-01 MED ORDER — LIDOCAINE HCL (CARDIAC) PF 100 MG/5ML IV SOSY
PREFILLED_SYRINGE | INTRAVENOUS | Status: DC | PRN
  Administered 2019-09-01: 60 mg via INTRAVENOUS

## 2019-09-01 MED ORDER — LIDOCAINE HCL (PF) 2 % IJ SOLN
INTRAMUSCULAR | Status: AC
  Filled 2019-09-01: qty 10

## 2019-09-01 MED ORDER — HYDROCODONE-ACETAMINOPHEN 5-325 MG PO TABS
ORAL_TABLET | ORAL | Status: AC
  Filled 2019-09-01: qty 1

## 2019-09-01 MED ORDER — LIDOCAINE HCL (PF) 1 % IJ SOLN
INTRAMUSCULAR | Status: AC
  Filled 2019-09-01: qty 30

## 2019-09-01 MED ORDER — MIDAZOLAM HCL 2 MG/2ML IJ SOLN
INTRAMUSCULAR | Status: DC | PRN
  Administered 2019-09-01: 2 mg via INTRAVENOUS

## 2019-09-01 MED ORDER — SUGAMMADEX SODIUM 500 MG/5ML IV SOLN
INTRAVENOUS | Status: DC | PRN
  Administered 2019-09-01: 150 mg via INTRAVENOUS

## 2019-09-01 MED ORDER — PROPOFOL 10 MG/ML IV BOLUS
INTRAVENOUS | Status: DC | PRN
  Administered 2019-09-01: 160 mg via INTRAVENOUS

## 2019-09-01 MED ORDER — ONDANSETRON HCL 4 MG PO TABS: 4.0000 mg | ORAL_TABLET | Freq: Four times a day (QID) | ORAL | Status: DC | PRN

## 2019-09-01 MED ORDER — METOCLOPRAMIDE HCL 10 MG PO TABS: 5.0000 mg | ORAL_TABLET | Freq: Three times a day (TID) | ORAL | Status: DC | PRN

## 2019-09-01 MED ORDER — BUPIVACAINE-EPINEPHRINE (PF) 0.5% -1:200000 IJ SOLN
INTRAMUSCULAR | Status: DC | PRN
  Administered 2019-09-01: 15 mL via PERINEURAL

## 2019-09-01 MED ORDER — HYDROCODONE-ACETAMINOPHEN 5-325 MG PO TABS
1.0000 | ORAL_TABLET | ORAL | Status: DC | PRN
  Administered 2019-09-01: 1 via ORAL

## 2019-09-01 MED ORDER — BUPIVACAINE-EPINEPHRINE (PF) 0.5% -1:200000 IJ SOLN
INTRAMUSCULAR | Status: AC
  Filled 2019-09-01: qty 30

## 2019-09-01 MED ORDER — HYDROCODONE-ACETAMINOPHEN 5-325 MG PO TABS: 1.0000 | ORAL_TABLET | Freq: Four times a day (QID) | ORAL | 0 refills | Status: DC | PRN

## 2019-09-01 MED ORDER — ONDANSETRON HCL 4 MG/2ML IJ SOLN
INTRAMUSCULAR | Status: AC
  Filled 2019-09-01: qty 2

## 2019-09-01 MED ORDER — GLYCOPYRROLATE 0.2 MG/ML IJ SOLN
INTRAMUSCULAR | Status: DC | PRN
  Administered 2019-09-01: .1 mg via INTRAVENOUS

## 2019-09-01 MED ORDER — LIDOCAINE HCL 1 % IJ SOLN
INTRAMUSCULAR | Status: DC | PRN
  Administered 2019-09-01: 15 mL

## 2019-09-01 MED ORDER — CEFAZOLIN SODIUM-DEXTROSE 2-4 GM/100ML-% IV SOLN
INTRAVENOUS | Status: AC
  Filled 2019-09-01: qty 100

## 2019-09-01 MED ORDER — GLYCOPYRROLATE 0.2 MG/ML IJ SOLN
INTRAMUSCULAR | Status: AC
  Filled 2019-09-01: qty 1

## 2019-09-01 SURGICAL SUPPLY — 47 items
BLADE FULL RADIUS 3.5 (BLADE) ×2 IMPLANT
BNDG COHESIVE 4X5 TAN STRL (GAUZE/BANDAGES/DRESSINGS) ×4 IMPLANT
BNDG COHESIVE 6X5 TAN STRL LF (GAUZE/BANDAGES/DRESSINGS) ×2 IMPLANT
BNDG ELASTIC 4X5.8 VLCR STR LF (GAUZE/BANDAGES/DRESSINGS) ×2 IMPLANT
BNDG ESMARK 4X12 TAN STRL LF (GAUZE/BANDAGES/DRESSINGS) ×2 IMPLANT
BNDG GAUZE 4.5X4.1 6PLY STRL (MISCELLANEOUS) ×2 IMPLANT
BUR ABRADER 4.0 W/FLUTE AQUA (MISCELLANEOUS) ×1 IMPLANT
BURR ABRADER 4.0 W/FLUTE AQUA (MISCELLANEOUS) ×2
CANNULA SHAVER SCOPE W/RIB (CANNULA) ×2 IMPLANT
CHLORAPREP W/TINT 26 (MISCELLANEOUS) ×2 IMPLANT
COVER WAND RF STERILE (DRAPES) ×2 IMPLANT
CUFF TOURN SGL QUICK 18 (TOURNIQUET CUFF) ×2 IMPLANT
CUFF TOURN SGL QUICK 24 (TOURNIQUET CUFF)
CUFF TOURN SGL QUICK 30 (TOURNIQUET CUFF)
CUFF TRNQT CYL 24X4X16.5-23 (TOURNIQUET CUFF) IMPLANT
CUFF TRNQT CYL 30X4X21-28X (TOURNIQUET CUFF) IMPLANT
DRAPE 3/4 80X56 (DRAPES) ×4 IMPLANT
DRAPE INCISE IOBAN 66X45 STRL (DRAPES) ×2 IMPLANT
DRAPE U-SHAPE 47X51 STRL (DRAPES) IMPLANT
ELECT REM PT RETURN 9FT ADLT (ELECTROSURGICAL) ×2
ELECTRODE REM PT RTRN 9FT ADLT (ELECTROSURGICAL) ×1 IMPLANT
GAUZE SPONGE 4X4 12PLY STRL (GAUZE/BANDAGES/DRESSINGS) ×2 IMPLANT
GAUZE XEROFORM 1X8 LF (GAUZE/BANDAGES/DRESSINGS) ×2 IMPLANT
GLOVE BIO SURGEON STRL SZ8 (GLOVE) ×4 IMPLANT
GLOVE INDICATOR 8.0 STRL GRN (GLOVE) ×2 IMPLANT
GOWN STRL REUS W/ TWL LRG LVL3 (GOWN DISPOSABLE) ×2 IMPLANT
GOWN STRL REUS W/ TWL XL LVL3 (GOWN DISPOSABLE) ×1 IMPLANT
GOWN STRL REUS W/TWL LRG LVL3 (GOWN DISPOSABLE) ×2
GOWN STRL REUS W/TWL XL LVL3 (GOWN DISPOSABLE) ×1
IV LACTATED RINGER IRRG 3000ML (IV SOLUTION) ×2
IV LR IRRIG 3000ML ARTHROMATIC (IV SOLUTION) ×2 IMPLANT
KIT TURNOVER KIT A (KITS) ×2 IMPLANT
MANIFOLD NEPTUNE II (INSTRUMENTS) ×2 IMPLANT
MAT ABSORB  FLUID 56X50 GRAY (MISCELLANEOUS) ×1
MAT ABSORB FLUID 56X50 GRAY (MISCELLANEOUS) ×1 IMPLANT
NDL HPO THNWL 1X22GA REG BVL (NEEDLE) ×1 IMPLANT
NEEDLE SAFETY 22GX1 (NEEDLE) ×1
NS IRRIG 1000ML POUR BTL (IV SOLUTION) ×2 IMPLANT
PACK ARTHROSCOPY KNEE (MISCELLANEOUS) ×2 IMPLANT
PAD ABD DERMACEA PRESS 5X9 (GAUZE/BANDAGES/DRESSINGS) ×2 IMPLANT
SLING ARM LRG DEEP (SOFTGOODS) ×2 IMPLANT
SLING ARM M TX990204 (SOFTGOODS) IMPLANT
STOCKINETTE M/LG 89821 (MISCELLANEOUS) ×2 IMPLANT
SUT PROLENE 4 0 PS 2 18 (SUTURE) ×4 IMPLANT
SYR 30ML LL (SYRINGE) ×2 IMPLANT
TUBING ARTHRO INFLOW-ONLY STRL (TUBING) ×2 IMPLANT
WAND WEREWOLF FLOW 90D (MISCELLANEOUS) IMPLANT

## 2019-09-01 NOTE — Consult Note (Signed)
Paper H&P to be scanned into permanent record. H&P reviewed and patient re-examined. No changes. 

## 2019-09-01 NOTE — Anesthesia Postprocedure Evaluation (Signed)
Anesthesia Post Note  Patient: Wesley Bridges  Procedure(s) Performed: RIGHT ELBOW ARTHROSCOPY WITH DEBRIDEMENT AND EXCISION OF LOOSE BODIES (Right Elbow)  Patient location during evaluation: PACU Anesthesia Type: General Level of consciousness: awake and alert Pain management: pain level controlled Vital Signs Assessment: post-procedure vital signs reviewed and stable Respiratory status: spontaneous breathing and respiratory function stable Cardiovascular status: stable Anesthetic complications: no     Last Vitals:  Vitals:   09/01/19 1030 09/01/19 1043  BP: 110/80 104/75  Pulse: (!) 51   Resp: 12 16  Temp: (!) 36.1 C (!) 36.1 C  SpO2: 100% 98%    Last Pain:  Vitals:   09/01/19 1054  TempSrc:   PainSc: 7                  Buffy Ehler K

## 2019-09-01 NOTE — Anesthesia Procedure Notes (Signed)
Procedure Name: Intubation Date/Time: 09/01/2019 7:39 AM Performed by: Gentry Fitz, CRNA Pre-anesthesia Checklist: Patient identified, Emergency Drugs available, Suction available and Patient being monitored Patient Re-evaluated:Patient Re-evaluated prior to induction Oxygen Delivery Method: Circle system utilized Preoxygenation: Pre-oxygenation with 100% oxygen Induction Type: IV induction and Cricoid Pressure applied Ventilation: Mask ventilation without difficulty Laryngoscope Size: McGraph and 4 Grade View: Grade II Tube size: 7.5 mm Number of attempts: 2 Placement Confirmation: ETT inserted through vocal cords under direct vision,  positive ETCO2 and breath sounds checked- equal and bilateral Secured at: 22 cm Dental Injury: Teeth and Oropharynx as per pre-operative assessment

## 2019-09-01 NOTE — Op Note (Signed)
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  Post-Op Diagnosis:   Early degenerative joint disease with grade 3-4 chondromalacia of capitellum and radial head, extensive synovitis, and chondral loose bodies, right elbow.  Procedure:   Arthroscopic debridement, abrasion chondroplasty of capitellum and radial head, and excision of loose bodies, right elbow.  Surgeon:   Pascal Lux, MD  Assistant:   None  Anesthesia:   GET  Findings:   As above.  There were grade 1 chondromalacial changes involving the ulnohumeral joint.  Complications:   None  EBL:   2 cc  Fluids:   900 cc crystalloid  TT:   54 minutes at 250 mmHg  Drains:   None  Closure:   4-0 Prolene interrupted sutures  IBrief Clinical Note:   The patient is a 49 year old male with a several year history of intermittent painful catching in his right elbow. His symptoms have worsened over the past 6 months or so. The symptoms have persisted despite medications, activity modification, injections, etc. His history examination are consistent with loose bodies in his elbow. An MRI scan suggested chondral damage to the capitellum. He presents at this time for a right elbow arthroscopy with debridement and excision of presumed cartilaginous loose bodies.  Procedure:   The patient was brought into the operating room and lain in the supine position. After adequate general endotracheal intubation and anesthesia was obtained, the patient was repositioned in the prone position with care taken to be sure the chest and knees were well padded and that the neck was not unduly extended. A tourniquet was placed around the upper arm before the arm was secured to the arm holder using a 4 inch Coban. After verifying the appropriate surgical site with a timeout, a mixture of 15 cc of 0.5% Sensorcaine and 15 cc of 1% lidocaine with epinephrine was injected  into the right elbow joint using sterile technique. The right upper extremity was prepped with ChloraPrep solution before being draped sterilely. Preoperative antibiotics were administered. The limb was exsanguinated with an Esmarch and the tourniquet inflated to 250 mmHg.   A proximal anteromedial portal was created using an outside-in technique at a spot 2 cm proximal and 1 cm anterior to the medial epicondyle. The skin was incised with a #15 blade before blunt dissection was carried down through the subcutaneous tissues using a hemostat. The intra-articular aspect of the elbow was carefully inspected with the findings as described above. A separate anterolateral portal was created 2 cm proximal and 1 center anterior to the lateral epicondyle using an outside-in technique. Areas of extensive synovitis anteriorly were debrided back to stable margins using the full-radius resector. In addition, a chondral loose body was identified anterior to the radial head which was removed using the full-radius resector. Laterally, the radial head demonstrated an area of grade IV chondromalacia in the center of the head with areas of delamination along the posterior and ulnar perimeter. These areas were debrided back to stable margins using the full-radius resector. It appeared as though the subchondral bone in the areas of delaminating articular cartilage were avascular. The instruments were then removed from the anterior compartment.  A posterolateral portal was created 3 cm proximal to the tip of the olecranon along the lateral border of the triceps tendon before the camera was inserted. A second posterolateral portal was created more distally level with the tip of the  olecranon. The 3.5 mm full-radius resector was inserted through the more distal postero-lateral portal site and synovial tissues debrided from the olecranon fossa as well as along the posterolateral gutter, removing a posterior lateral plica in the process.  The camera was then placed in the posterolateral portal and the shaver introduced through a third more distal posterolateral portal placed near the posterior lateral soft spot to provide access to the capitellum. An abrasion chondroplasty of grade 3-4 chondromalacial changes involving the posterior portion of the capitellum/distal was performed using the full-radius resector. Again the remaining articular cartilage was assessed and found to be stable. The instruments were removed from the posterior compartment after suctioning the excess fluid.   The portal sites were reapproximated using 4-0 Prolene interrupted sutures before a sterile bulky dressings were applied to the elbow. The patient was rolled back in the supine position on his stretcher before arm was placed into a standard sling for comfort. Subsequently, the patient was awakened, extubated, and returned to the recovery room in satisfactory condition after tolerating the procedure well.

## 2019-09-01 NOTE — Discharge Instructions (Addendum)
Orthopedic discharge instructions: Keep dressing dry and intact. Keep arm elevated above heart level. May shower after dressing removed on postop day 4 (Saturday). Cover sutures with Band-Aids after drying off. Apply ice to affected area frequently. Take ibuprofen 600-800 mg TID with meals for 7-10 days, then as necessary. Take ES Tylenol or pain medication as prescribed when needed.  Return for follow-up in 10-14 days or as scheduled.   AMBULATORY SURGERY  DISCHARGE INSTRUCTIONS   1) The drugs that you were given will stay in your system until tomorrow so for the next 24 hours you should not:  A) Drive an automobile B) Make any legal decisions C) Drink any alcoholic beverage   2) You may resume regular meals tomorrow.  Today it is better to start with liquids and gradually work up to solid foods.  You may eat anything you prefer, but it is better to start with liquids, then soup and crackers, and gradually work up to solid foods.   3) Please notify your doctor immediately if you have any unusual bleeding, trouble breathing, redness and pain at the surgery site, drainage, fever, or pain not relieved by medication.    4) Additional Instructions:        Please contact your physician with any problems or Same Day Surgery at [phone], Monday through Friday 6 am to 4 pm, or [place] at [facility] number at [phone].

## 2019-09-01 NOTE — H&P (Signed)
Paper H&P to be scanned into permanent record. H&P reviewed and patient re-examined. No changes. 

## 2019-09-01 NOTE — Transfer of Care (Signed)
Immediate Anesthesia Transfer of Care Note  Patient: Wesley Bridges  Procedure(s) Performed: RIGHT ELBOW ARTHROSCOPY WITH DEBRIDEMENT AND EXCISION OF LOOSE BODIES (Right Elbow)  Patient Location: PACU  Anesthesia Type:General  Level of Consciousness: drowsy and patient cooperative  Airway & Oxygen Therapy: Patient Spontanous Breathing and Patient connected to face mask oxygen  Post-op Assessment: Report given to RN and Post -op Vital signs reviewed and stable  Post vital signs: Reviewed and stable  Last Vitals:  Vitals Value Taken Time  BP 88/61 09/01/19 0931  Temp 35.8 C 09/01/19 0926  Pulse 51 09/01/19 0934  Resp 18 09/01/19 0934  SpO2 100 % 09/01/19 0934  Vitals shown include unvalidated device data.  Last Pain:  Vitals:   09/01/19 0926  TempSrc:   PainSc: 0-No pain         Complications: No apparent anesthesia complications

## 2019-09-01 NOTE — Anesthesia Preprocedure Evaluation (Signed)
Anesthesia Evaluation  Patient identified by MRN, date of birth, ID band Patient awake    Reviewed: Allergy & Precautions, NPO status , Patient's Chart, lab work & pertinent test results  History of Anesthesia Complications Negative for: history of anesthetic complications  Airway Mallampati: III       Dental   Pulmonary sleep apnea and Continuous Positive Airway Pressure Ventilation , neg COPD, Not current smoker,           Cardiovascular hypertension, Pt. on medications (-) Past MI and (-) CHF + dysrhythmias (palpitations, no tx) (-) Valvular Problems/Murmurs     Neuro/Psych neg Seizures Depression    GI/Hepatic Neg liver ROS, GERD  Medicated and Controlled,  Endo/Other  neg diabetesHyperthyroidism (no tx currently)   Renal/GU negative Renal ROS     Musculoskeletal   Abdominal   Peds  Hematology   Anesthesia Other Findings   Reproductive/Obstetrics                             Anesthesia Physical Anesthesia Plan  ASA: III  Anesthesia Plan: General   Post-op Pain Management:    Induction: Intravenous  PONV Risk Score and Plan: 2 and Ondansetron and Dexamethasone  Airway Management Planned: LMA  Additional Equipment:   Intra-op Plan:   Post-operative Plan:   Informed Consent: I have reviewed the patients History and Physical, chart, labs and discussed the procedure including the risks, benefits and alternatives for the proposed anesthesia with the patient or authorized representative who has indicated his/her understanding and acceptance.       Plan Discussed with:   Anesthesia Plan Comments:         Anesthesia Quick Evaluation

## 2019-09-25 ENCOUNTER — Ambulatory Visit: Payer: Medicaid Other

## 2019-10-07 ENCOUNTER — Other Ambulatory Visit: Payer: Self-pay

## 2019-10-07 ENCOUNTER — Ambulatory Visit (INDEPENDENT_AMBULATORY_CARE_PROVIDER_SITE_OTHER): Payer: BLUE CROSS/BLUE SHIELD | Admitting: Family Medicine

## 2019-10-07 ENCOUNTER — Encounter: Payer: Self-pay | Admitting: Family Medicine

## 2019-10-07 VITALS — BP 120/60 | HR 75 | Temp 96.5°F | Ht 67.0 in | Wt 208.6 lb

## 2019-10-07 DIAGNOSIS — B351 Tinea unguium: Secondary | ICD-10-CM

## 2019-10-07 DIAGNOSIS — E785 Hyperlipidemia, unspecified: Secondary | ICD-10-CM | POA: Diagnosis not present

## 2019-10-07 DIAGNOSIS — F322 Major depressive disorder, single episode, severe without psychotic features: Secondary | ICD-10-CM | POA: Diagnosis not present

## 2019-10-07 DIAGNOSIS — I10 Essential (primary) hypertension: Secondary | ICD-10-CM | POA: Diagnosis not present

## 2019-10-07 DIAGNOSIS — K219 Gastro-esophageal reflux disease without esophagitis: Secondary | ICD-10-CM | POA: Diagnosis not present

## 2019-10-07 DIAGNOSIS — E669 Obesity, unspecified: Secondary | ICD-10-CM

## 2019-10-07 DIAGNOSIS — M1990 Unspecified osteoarthritis, unspecified site: Secondary | ICD-10-CM

## 2019-10-07 LAB — LIPID PANEL
Cholesterol: 134 mg/dL (ref 0–200)
HDL: 46.1 mg/dL (ref >39.00)
LDL Cholesterol: 77 mg/dL (ref 0–99)
NonHDL: 88.24
Total CHOL/HDL Ratio: 3
Triglycerides: 57 mg/dL (ref 0.0–149.0)
VLDL: 11.4 mg/dL (ref 0.0–40.0)

## 2019-10-07 LAB — COMPREHENSIVE METABOLIC PANEL
ALT: 28 U/L (ref 0–53)
AST: 25 U/L (ref 0–37)
Albumin: 4.1 g/dL (ref 3.5–5.2)
Alkaline Phosphatase: 80 U/L (ref 39–117)
BUN: 12 mg/dL (ref 6–23)
CO2: 24 mEq/L (ref 19–32)
Calcium: 9.1 mg/dL (ref 8.4–10.5)
Chloride: 110 mEq/L (ref 96–112)
Creatinine, Ser: 1.11 mg/dL (ref 0.40–1.50)
GFR: 70.08 mL/min (ref >60.00)
Glucose, Bld: 120 mg/dL — ABNORMAL HIGH (ref 70–99)
Potassium: 4.4 mEq/L (ref 3.5–5.1)
Sodium: 139 mEq/L (ref 135–145)
Total Bilirubin: 0.4 mg/dL (ref 0.2–1.2)
Total Protein: 6.7 g/dL (ref 6.0–8.3)

## 2019-10-07 MED ORDER — PANTOPRAZOLE SODIUM 40 MG PO TBEC: 40.0000 mg | DELAYED_RELEASE_TABLET | Freq: Every day | ORAL | 3 refills | Status: DC

## 2019-10-07 MED ORDER — CARVEDILOL 12.5 MG PO TABS: 12.5000 mg | ORAL_TABLET | Freq: Two times a day (BID) | ORAL | 1 refills | Status: DC

## 2019-10-07 MED ORDER — ATORVASTATIN CALCIUM 40 MG PO TABS: 40.0000 mg | ORAL_TABLET | Freq: Every day | ORAL | 3 refills | Status: DC

## 2019-10-07 MED ORDER — VENLAFAXINE HCL 100 MG PO TABS: 100.0000 mg | ORAL_TABLET | Freq: Two times a day (BID) | ORAL | 1 refills | Status: DC

## 2019-10-07 NOTE — Assessment & Plan Note (Signed)
Encouraged healthy diet and discussed exercising as he is able to with his limitations.

## 2019-10-07 NOTE — Progress Notes (Signed)
Wesley Rumps, MD Phone: [phone]  Wesley Bridges is a 49 y.o. male who presents today for follow-up.  Hypertension: Has been running less than 120/60 at times.  He has been getting lightheaded.  He is on carvedilol and lisinopril.  No chest pain or shortness of breath.  No edema.  He has noted a cough typically at night since we made medication changes about 6 months.  Depression: Patient notes this is okay.  He thinks the Effexor has been helpful though he thinks he could get more benefit from it.  He has not seen psychiatry recently.  No active SI. He notes passive thoughts of being better off dead.  GERD: Taking Protonix.  Occasionally has reflux and abdominal pain despite taking the Protonix.  No blood in his stool.  No dysphagia.  No EGD.  He was previously scheduled to have an EGD through GI though it was canceled and never got rescheduled.  He is also due for colonoscopy.  Obesity: Describes diet is normal.  Not much junk food.  Not much soda or sweet tea.  Not many sweets.  He is not able to do much exercise related to his orthopedic issues.  Onychomycosis: Notes thickened yellow toenails on bilateral toes.  He is interested in doing something for this.  He tried topical over-the-counter medicines with no benefit.  Social History   Tobacco Use  Smoking Status Never Smoker  Smokeless Tobacco Never Used     ROS see history of present illness  Objective  Physical Exam Vitals:   10/07/19 0950  BP: 120/60  Pulse: 75  Temp: (!) 96.5 F (35.8 C)  SpO2: 99%    BP Readings from Last 3 Encounters:  10/07/19 120/60  09/01/19 116/77  05/13/19 111/68   Wt Readings from Last 3 Encounters:  10/07/19 208 lb 9.6 oz (94.6 kg)  05/13/19 210 lb (95.3 kg)  04/09/19 213 lb 4 oz (96.7 kg)    Physical Exam Constitutional:      General: He is not in acute distress.    Appearance: He is not diaphoretic.  Cardiovascular:     Rate and Rhythm: Normal rate and regular  rhythm.     Heart sounds: Normal heart sounds.  Pulmonary:     Effort: Pulmonary effort is normal.     Breath sounds: Normal breath sounds.  Abdominal:     General: Bowel sounds are normal. There is no distension.     Palpations: Abdomen is soft.     Tenderness: There is no abdominal tenderness. There is no guarding or rebound.  Musculoskeletal:     Right lower leg: No edema.     Left lower leg: No edema.  Skin:    General: Skin is warm and dry.     Comments: Thickened yellow toenails bilateral toes  Neurological:     Mental Status: He is alert.      Assessment/Plan: Please see individual problem list.  Essential hypertension Overly controlled.  We will decrease his carvedilol dose.  We will check with our clinical pharmacist regarding switching him from lisinopril to losartan.  Esophageal reflux Continue Protonix.  Refer to GI for EGD and colonoscopy.  Arthritis Chronic arthritic issues limit his ability to exercise.  He will do what he can for exercise.  Obesity (BMI 30.0-34.9) Encouraged healthy diet and discussed exercising as he is able to with his limitations.  Hyperlipidemia Check lipid panel.  Onychomycosis Patient is interested in treatment for this.  He declines podiatry referral.  Discussed topical treatments and how they are generally ineffective.  Discussed oral treatment with Lamisil.  Discussed risk of liver damage.  Discussed that typically we do not see this though there is a risk of chronic liver issues with this medication.  Advised that we would periodically monitor his liver function while he is on the medication and discontinue it if the liver enzymes increase.  We will check his liver function today and if adequate I will send in Lamisil for him to start on.  I did discuss that the Lamisil may not work.  Advised to contact us if he develops any abdominal pain.  Major depression, single episode Patient continues to have issues with this.  He is taking  Effexor 75 mg twice daily.  He does note passive thoughts of being better off dead though no active thoughts of suicide.  I advised that if he ever developed a plan or intent or thoughts of killing himself he needs to go to the emergency room.  We will increase his Effexor.  Follow-up in 3 months.   Orders Placed This Encounter  Procedures  . Comp Met (CMET)  . Lipid panel  . Hepatic function panel    Standing Status:   Future    Standing Expiration Date:   10/06/2020  . Ambulatory referral to Gastroenterology    Referral Priority:   Routine    Referral Type:   Consultation    Referral Reason:   Specialty Services Required    Number of Visits Requested:   1    Meds ordered this encounter  Medications  . carvedilol (COREG) 12.5 MG tablet    Sig: Take 1 tablet (12.5 mg total) by mouth 2 (two) times daily with a meal.    Dispense:  180 tablet    Refill:  1  . atorvastatin (LIPITOR) 40 MG tablet    Sig: Take 1 tablet (40 mg total) by mouth daily.    Dispense:  90 tablet    Refill:  3  . pantoprazole (PROTONIX) 40 MG tablet    Sig: Take 1 tablet (40 mg total) by mouth daily.    Dispense:  90 tablet    Refill:  3  . venlafaxine (EFFEXOR) 100 MG tablet    Sig: Take 1 tablet (100 mg total) by mouth 2 (two) times daily.    Dispense:  180 tablet    Refill:  1    This visit occurred during the SARS-CoV-2 public health emergency.  Safety protocols were in place, including screening questions prior to the visit, additional usage of staff PPE, and extensive cleaning of exam room while observing appropriate contact time as indicated for disinfecting solutions.    Wesley Rumps, MD [place]

## 2019-10-07 NOTE — Assessment & Plan Note (Signed)
Overly controlled.  We will decrease his carvedilol dose.  We will check with our clinical pharmacist regarding switching him from lisinopril to losartan.

## 2019-10-07 NOTE — Patient Instructions (Signed)
Nice to see you. We will get labs today and contact you with the results. I referred you to GI. We will consider starting on Lamisil for your toenails once your labs return.

## 2019-10-07 NOTE — Assessment & Plan Note (Signed)
Patient continues to have issues with this.  He is taking Effexor 75 mg twice daily.  He does note passive thoughts of being better off dead though no active thoughts of suicide.  I advised that if he ever developed a plan or intent or thoughts of killing himself he needs to go to the emergency room.  We will increase his Effexor.  Follow-up in 3 months.

## 2019-10-07 NOTE — Assessment & Plan Note (Signed)
Patient is interested in treatment for this.  He declines podiatry referral.  Discussed topical treatments and how they are generally ineffective.  Discussed oral treatment with Lamisil.  Discussed risk of liver damage.  Discussed that typically we do not see this though there is a risk of chronic liver issues with this medication.  Advised that we would periodically monitor his liver function while he is on the medication and discontinue it if the liver enzymes increase.  We will check his liver function today and if adequate I will send in Lamisil for him to start on.  I did discuss that the Lamisil may not work.  Advised to contact us if he develops any abdominal pain.

## 2019-10-07 NOTE — Assessment & Plan Note (Signed)
Check lipid panel  

## 2019-10-07 NOTE — Assessment & Plan Note (Signed)
Continue Protonix.  Refer to GI for EGD and colonoscopy.

## 2019-10-07 NOTE — Assessment & Plan Note (Signed)
Chronic arthritic issues limit his ability to exercise.  He will do what he can for exercise.

## 2019-10-08 ENCOUNTER — Telehealth: Payer: Self-pay | Admitting: Family Medicine

## 2019-10-08 DIAGNOSIS — I10 Essential (primary) hypertension: Secondary | ICD-10-CM

## 2019-10-08 MED ORDER — LOSARTAN POTASSIUM 100 MG PO TABS: 100.0000 mg | ORAL_TABLET | Freq: Every day | ORAL | 1 refills | Status: DC

## 2019-10-08 NOTE — Telephone Encounter (Signed)
Please call the patient.  Please let him know that I sent in losartan for him to start on for his blood pressure.  I sent this to his mail order pharmacy.  He will discontinue the lisinopril and then start the losartan the next day.  He should not discontinue the lisinopril until he gets the losartan to start on.  He needs lab work 7 to 10 days after starting the losartan.  An order has been placed.

## 2019-10-08 NOTE — Telephone Encounter (Signed)
I called the patient and informed him that the provider sent Losartan to his mail order and he is to stop the lisinopril but not to stop it until her received the losartan, I also informed him to call back to schedule a lab appt 7-10 days after he starts and he understood.  Wesley Bridges,cma

## 2019-10-18 IMAGING — DX DG ELBOW COMPLETE 3+V*R*
4 series · 4 of 4 positions shown · non-contrast
Comparison: None.

CLINICAL DATA: Decreased range of motion/extension RIGHT elbow, no
known injury

EXAM:
RIGHT ELBOW - COMPLETE 3+ VIEW

[elbow ap]
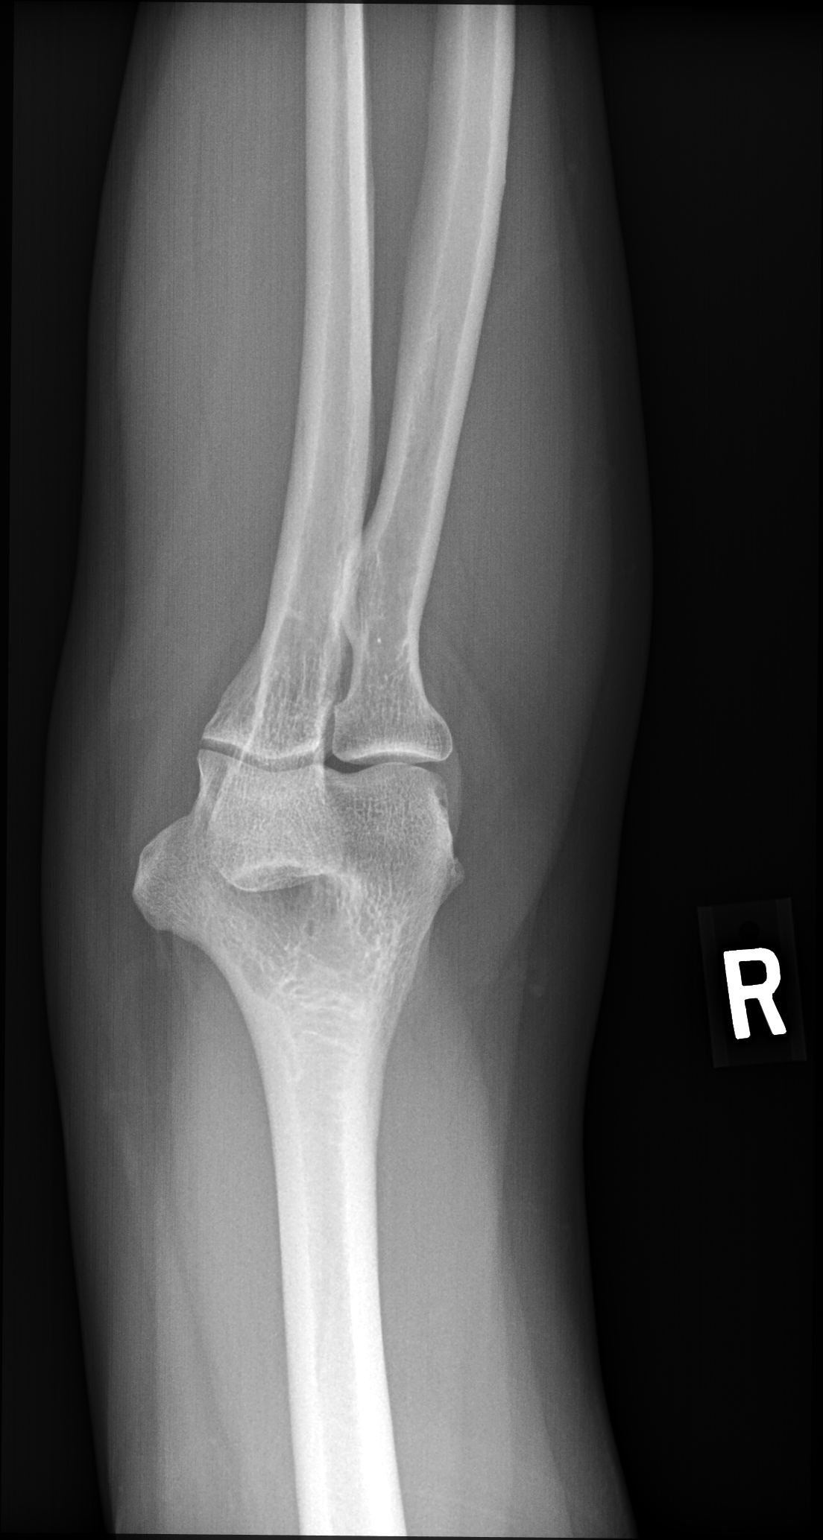

[elbow obl (oblique) (1 of 2)]
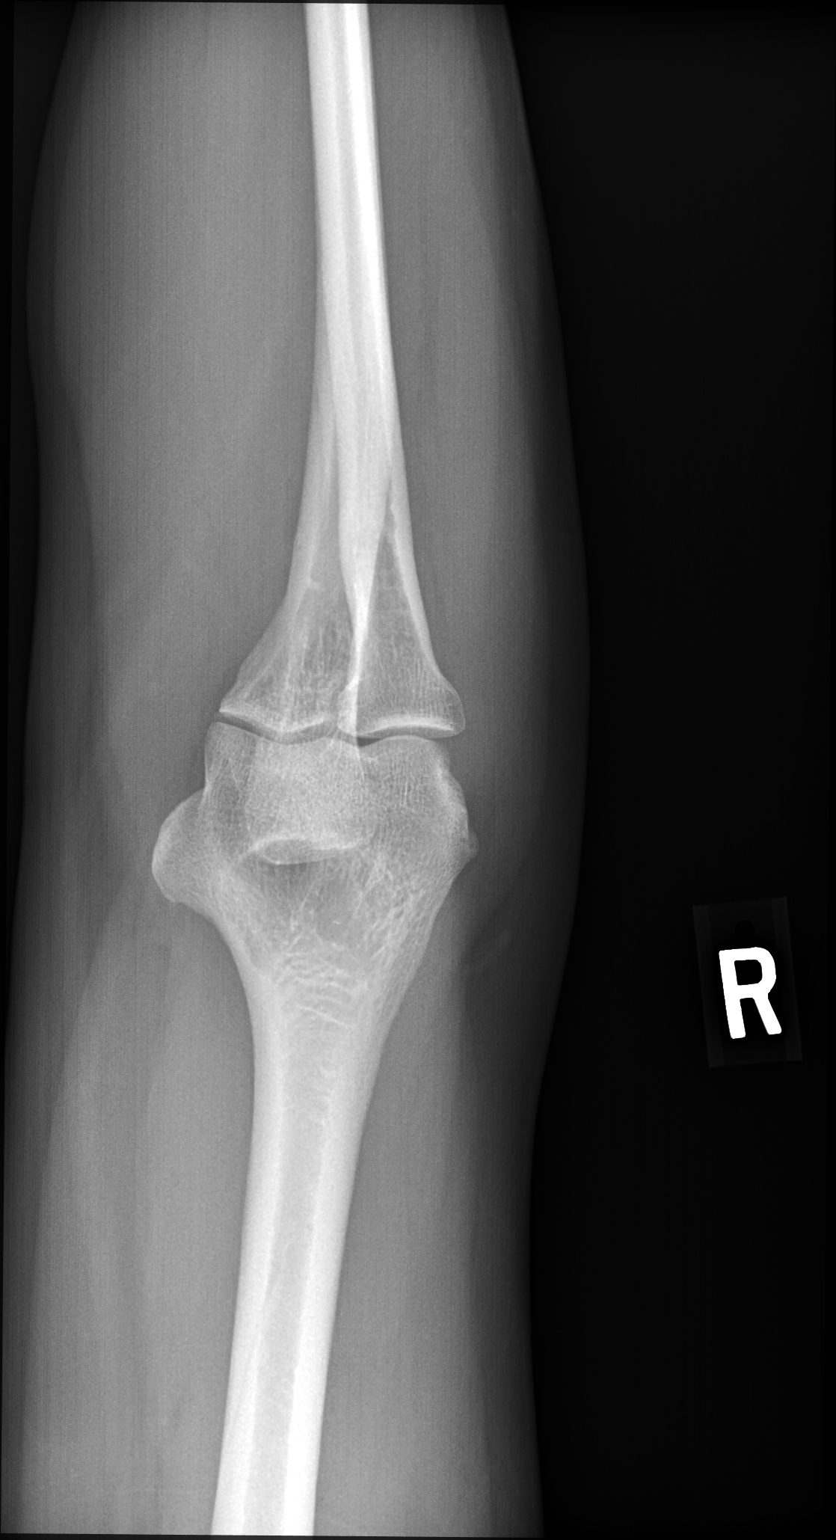

[elbow obl (oblique) (2 of 2)]
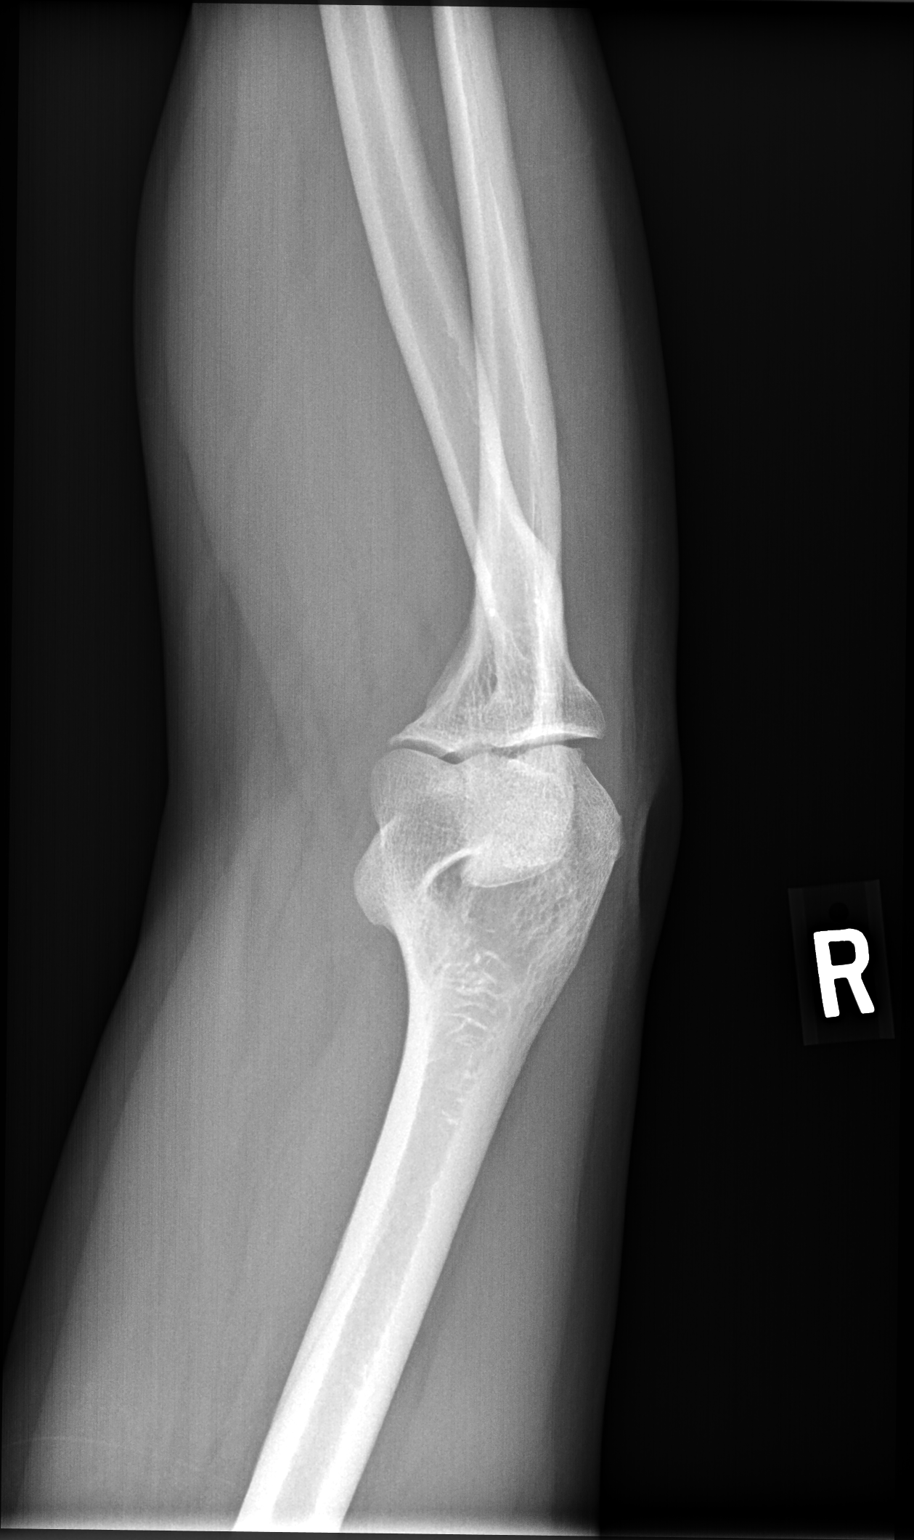

[elbow lat]
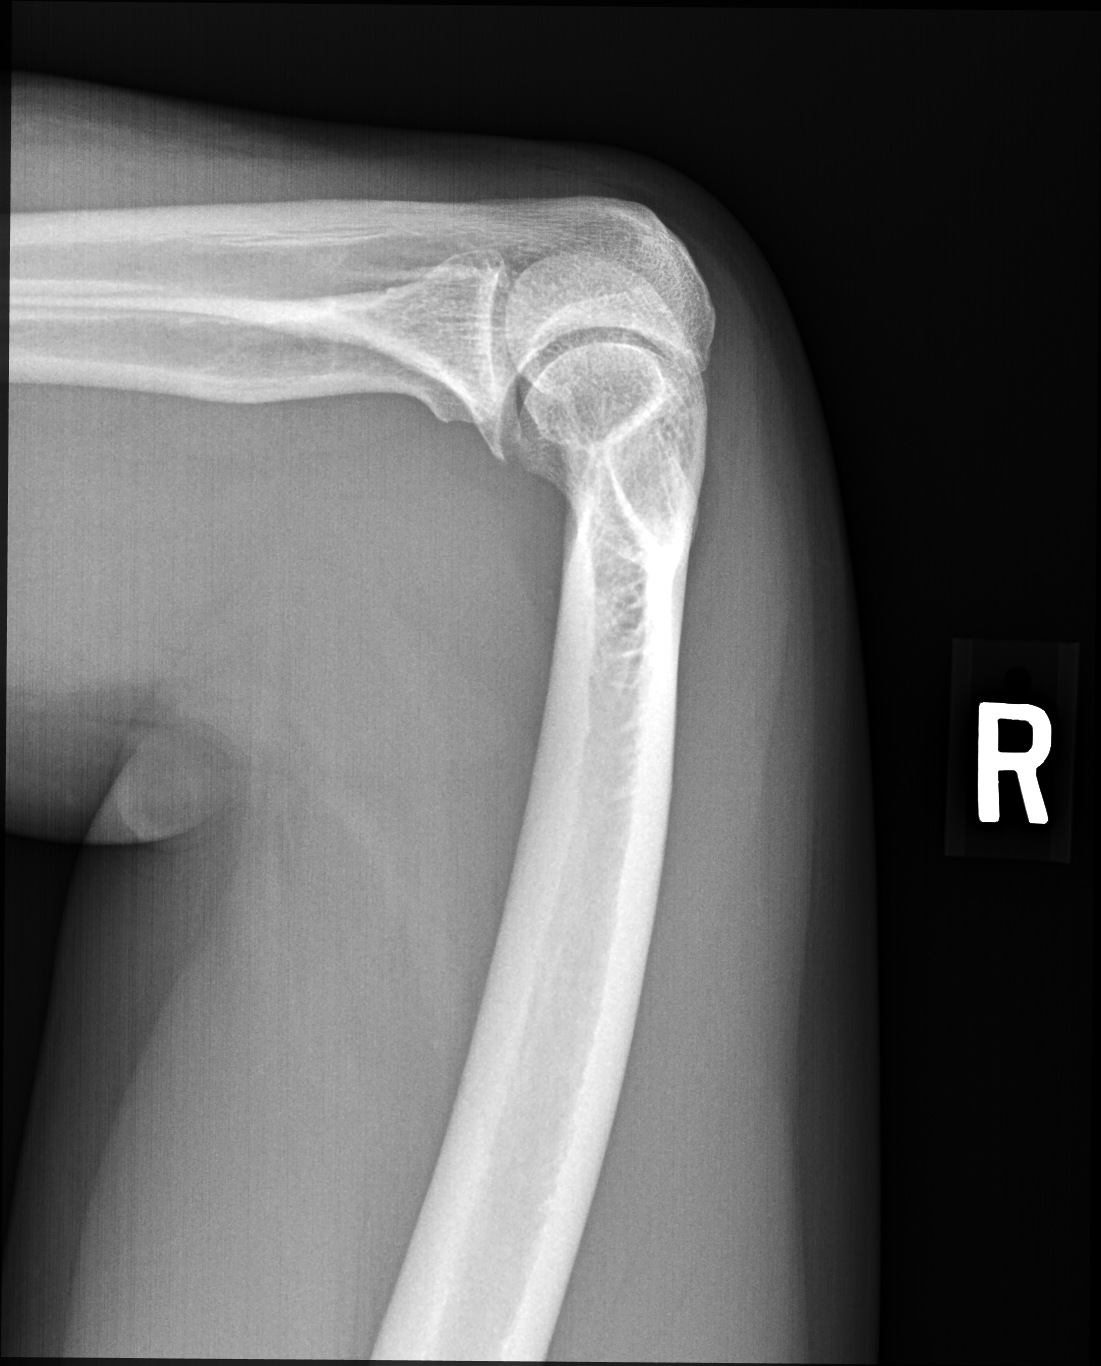

[4 of 4 positions shown; findings below may reference images not displayed]

FINDINGS: Osseous mineralization normal.

Joint spaces preserved.

Elbow joint effusion present.

No fracture, dislocation or bone destruction.
IMPRESSION: Elbow joint effusion without acute osseous findings.

## 2019-10-19 ENCOUNTER — Encounter: Payer: Self-pay | Admitting: Gastroenterology

## 2019-10-20 ENCOUNTER — Other Ambulatory Visit: Payer: Self-pay

## 2019-10-20 ENCOUNTER — Telehealth (INDEPENDENT_AMBULATORY_CARE_PROVIDER_SITE_OTHER): Payer: BC Managed Care – PPO | Admitting: Gastroenterology

## 2019-10-20 ENCOUNTER — Encounter: Payer: Self-pay | Admitting: Family Medicine

## 2019-10-20 ENCOUNTER — Encounter: Payer: Self-pay | Admitting: Gastroenterology

## 2019-10-20 DIAGNOSIS — K219 Gastro-esophageal reflux disease without esophagitis: Secondary | ICD-10-CM

## 2019-10-20 DIAGNOSIS — Z5181 Encounter for therapeutic drug level monitoring: Secondary | ICD-10-CM

## 2019-10-20 DIAGNOSIS — Z1211 Encounter for screening for malignant neoplasm of colon: Secondary | ICD-10-CM

## 2019-10-20 MED ORDER — NA SULFATE-K SULFATE-MG SULF 17.5-3.13-1.6 GM/177ML PO SOLN: 354.0000 mL | Freq: Once | ORAL | 0 refills | Status: AC

## 2019-10-20 NOTE — Patient Instructions (Signed)

## 2019-10-20 NOTE — Progress Notes (Signed)
Wesley Bridges [address]  [place]  [place],  [address]  Main: [phone]  Fax: [phone]   Gastroenterology Consultation  Referring Provider:     Leone Haven, MD Primary Care Physician:  Leone Haven, MD Reason for Consultation:     GERD        HPI:   Virtual Visit via Video Note  I connected with patient on 10/20/19 at 10:30 AM EDT by video ([email or web address]) and verified that I am speaking with the correct person using two identifiers.   I discussed the limitations, risks, security and privacy concerns of performing an evaluation and management service by video and the availability of in person appointments. I also discussed with the patient that there may be a patient responsible charge related to this service. The patient expressed understanding and agreed to proceed.  Location of the patient: Home Location of provider: Home Participating persons: Patient and provider only (Nursing staff checked in patient via phone but were not physically involved in the video interaction - see their notes)   History of Present Illness: Chief Complaint  Patient presents with  . New Patient (Initial Visit)  . Gastroesophageal Reflux    Patient is having burning in throat,   . Abdominal Pain    Patient is having Left upper abdominal pain has had some diarrhea     Wesley Bridges is a 49 y.o. y/o male referred for consultation & management  by Dr. Caryl Bis, Angela Adam, MD.  Reports chronic history of heartburn. Trigger foods exacerbate symptoms. Is on protonix, but he states has been taking it for 5 years.  Breakthrough symptoms are occasional but consist of burning sensation in chest, and even vomiting at times.  Reports occasional intermittent history of dysphagia as well with food sticking in the mid chest intermittently.  No prior EGD or colonoscopy.  No altered bowel habits or blood in stool.  No family history of colon cancer.  Past Medical History:    Diagnosis Date  . Anginal pain ([place])   . Arthritis   . Asthma   . Chronic headaches   . Complication of anesthesia    takes longer to wake up  . Depression   . Dyspnea   . Dysrhythmia   . Family history of adverse reaction to anesthesia    son takes longer to wake up than a normal person  . GERD (gastroesophageal reflux disease)   . Hyperlipidemia   . Hypertension   . Sleep apnea    cpap  . Subclinical hyperthyroidism     Past Surgical History:  Procedure Laterality Date  . ANKLE FRACTURE SURGERY    . ANKLE FUSION     [facility]  . CARDIAC CATHETERIZATION     no stent  . ELBOW ARTHROSCOPY Right 09/01/2019   Procedure: RIGHT ELBOW ARTHROSCOPY WITH DEBRIDEMENT AND EXCISION OF LOOSE BODIES;  Surgeon: Corky Mull, MD;  Location: [facility];  Service: Orthopedics;  Laterality: Right;  . FEMUR FRACTURE SURGERY    . FRACTURE SURGERY    . HERNIA REPAIR Bilateral    inguinal  . I & D EXTREMITY Right 07/30/2018   Procedure: DEBRIDEMENT OF THE COMMON EXTENSOR ORIGIN OF ELBOW;  Surgeon: Corky Mull, MD;  Location: [facility];  Service: Orthopedics;  Laterality: Right;  1.45 BIOMET JUGGERKNOT ANCHORS  . KNEE ARTHROSCOPY WITH MEDIAL MENISECTOMY Left 05/13/2019   Procedure: KNEE ARTHROSCOPY WITH DEBRIDEMENT AND REPAIR CH;  Surgeon: Corky Mull, MD;  Location: [facility];  Service: Orthopedics;  Laterality: Left;  . LEFT HEART CATH AND CORONARY ANGIOGRAPHY N/A 01/08/2017   Procedure: Left Heart Cath and Coronary Angiography;  Surgeon: Nelva Bush, MD;  Location: [facility];  Service: Cardiovascular;  Laterality: N/A;  . MANDIBLE FRACTURE SURGERY    . ROBOT ASSISTED INGUINAL HERNIA REPAIR Bilateral 04/17/2018   Procedure: ROBOT ASSISTED INGUINAL HERNIA REPAIR;  Surgeon: Jules Husbands, MD;  Location: [facility];  Service: General;  Laterality: Bilateral;    Prior to Admission medications   Medication Sig Start Date End Date Taking? Authorizing Provider  AJOVY 225 MG/1.5ML  SOSY Inject 225 mg into the skin every 28 (twenty-eight) days.  08/18/18  Yes [provider]  aspirin EC 81 MG tablet Take 81 mg by mouth daily.   Yes [provider]  atorvastatin (LIPITOR) 40 MG tablet Take 1 tablet (40 mg total) by mouth daily. 10/07/19  Yes Leone Haven, MD  carvedilol (COREG) 12.5 MG tablet Take 1 tablet (12.5 mg total) by mouth 2 (two) times daily with a meal. 10/07/19  Yes Leone Haven, MD  fluticasone (FLONASE) 50 MCG/ACT nasal spray Place 2 sprays into both nostrils daily. Patient taking differently: Place 2 sprays into both nostrils daily as needed for allergies.  03/30/19  Yes Leone Haven, MD  HYDROcodone-acetaminophen (NORCO) 5-325 MG tablet Take 1-2 tablets by mouth every 6 (six) hours as needed for moderate pain or severe pain. MAXIMUM TOTAL ACETAMINOPHEN DOSE IS 4000 MG PER DAY 09/01/19  Yes Poggi, Marshall Cork, MD  hydroxychloroquine (PLAQUENIL) 200 MG tablet Take 200 mg by mouth 2 (two) times daily.   Yes [provider]  ibuprofen (ADVIL) 800 MG tablet Take 800 mg by mouth 3 (three) times daily as needed for moderate pain.  01/23/19  Yes [provider]  loratadine (CLARITIN) 10 MG tablet Take 1 tablet (10 mg total) by mouth daily. Patient taking differently: Take 20 mg by mouth daily.  04/18/16  Yes Leone Haven, MD  losartan (COZAAR) 100 MG tablet Take 1 tablet (100 mg total) by mouth daily. 10/08/19  Yes Leone Haven, MD  Multiple Vitamin (MULTIVITAMIN WITH MINERALS) TABS tablet Take 1 tablet by mouth at bedtime.   Yes [provider]  nitroGLYCERIN (NITROSTAT) 0.4 MG SL tablet Place 1 tablet (0.4 mg total) under the tongue every 5 (five) minutes as needed for chest pain. 06/05/18  Yes End, Harrell Gave, MD  nortriptyline (PAMELOR) 50 MG capsule Take 50 mg by mouth at bedtime.    Yes [provider]  pantoprazole (PROTONIX) 40 MG tablet Take 1 tablet (40 mg total) by mouth daily. 10/07/19  Yes  Leone Haven, MD  topiramate (TOPAMAX) 50 MG tablet Take 100-125 mg by mouth See admin instructions. Take 100 mg by mouth in the morning and 125 mg in the evening 01/12/19  Yes [provider]  venlafaxine (EFFEXOR) 100 MG tablet Take 1 tablet (100 mg total) by mouth 2 (two) times daily. 10/07/19  Yes Leone Haven, MD    Family History  Problem Relation Age of Onset  . Arthritis Other        parents  . Hypertension Mother   . Thyroid disease Mother   . Osteoporosis Mother   . Syncope episode Mother   . Hypertension Brother      Social History   Tobacco Use  . Smoking status: Never Smoker  . Smokeless tobacco: Never Used  Substance Use  Topics  . Alcohol use: Yes    Alcohol/week: 14.0 standard drinks    Types: 14 Cans of beer per week    Comment: 2-3 a day   . Drug use: No    Allergies as of 10/20/2019 - Review Complete 10/20/2019  Allergen Reaction Noted  . Pollen extract    . Bee pollen Other (See Comments) 04/08/2018    Review of Systems:    All systems reviewed and negative except where noted in HPI.   Observations/Objective:  Labs: CBC    Component Value Date/Time   WBC 7.3 04/01/2019 1157   RBC 4.97 04/01/2019 1157   HGB 14.9 04/01/2019 1157   HGB 15.2 12/18/2016 1224   HCT 45.6 04/01/2019 1157   HCT 44.6 12/18/2016 1224   PLT 281 04/01/2019 1157   PLT 320 12/18/2016 1224   MCV 91.8 04/01/2019 1157   MCV 89 12/18/2016 1224   MCH 30.0 04/01/2019 1157   MCHC 32.7 04/01/2019 1157   RDW 13.0 04/01/2019 1157   RDW 14.3 12/18/2016 1224   LYMPHSABS 4.1 (H) 01/04/2017 1449   LYMPHSABS 3.0 12/18/2016 1224   MONOABS 0.8 01/04/2017 1449   EOSABS 0.3 01/04/2017 1449   EOSABS 0.2 12/18/2016 1224   BASOSABS 0.1 01/04/2017 1449   BASOSABS 0.1 12/18/2016 1224   CMP     Component Value Date/Time   NA 139 10/07/2019 1022   NA 139 12/18/2016 1224   K 4.4 10/07/2019 1022   CL 110 10/07/2019 1022   CO2 24 10/07/2019 1022   GLUCOSE 120 (H)  10/07/2019 1022   BUN 12 10/07/2019 1022   BUN 15 12/18/2016 1224   CREATININE 1.11 10/07/2019 1022   CALCIUM 9.1 10/07/2019 1022   PROT 6.7 10/07/2019 1022   ALBUMIN 4.1 10/07/2019 1022   AST 25 10/07/2019 1022   ALT 28 10/07/2019 1022   ALKPHOS 80 10/07/2019 1022   BILITOT 0.4 10/07/2019 1022   GFRNONAA >60 04/01/2019 1157   GFRAA >60 04/01/2019 1157    Imaging Studies: No results found.  Assessment and Plan:   Shirley Dudoit Bridges is a 49 y.o. y/o male has been referred for GERD  Assessment and Plan: EGD indicated for Barrett's screening.  Risk factors in this patient include age over 28, male gender, chronic GERD, central obesity  He is also reporting occasional intermittent dysphagia, therefore EGD to rule out narrowing or strictures and obtain biopsies for EOE  Patient educated extensively on acid reflux lifestyle modification, including buying a bed wedge, not eating 3 hrs before bedtime, diet modifications, and handout given for the same.   Colonoscopy indicated for screening  I have discussed alternative options, risks & benefits,  which include, but are not limited to, bleeding, infection, perforation,respiratory complication & drug reaction.  The patient agrees with this plan & written consent will be obtained.    Follow Up Instructions:   I discussed the assessment and treatment plan with the patient. The patient was provided an opportunity to ask questions and all were answered. The patient agreed with the plan and demonstrated an understanding of the instructions.   The patient was advised to call back or seek an in-person evaluation if the symptoms worsen or if the condition fails to improve as anticipated.  I provided 15 minutes of face-to-face time via video software during this encounter.  Additional time was spent in reviewing patient's chart, placing orders etc.   Virgel Manifold, MD  Speech recognition software was used to dictate the  above note.

## 2019-10-20 NOTE — Addendum Note (Signed)
Addended by: Ulyess Blossom L on: 10/20/2019 11:30 AM   Modules accepted: Orders

## 2019-10-21 MED ORDER — TERBINAFINE HCL 250 MG PO TABS: 250.0000 mg | ORAL_TABLET | Freq: Every day | ORAL | 2 refills | Status: DC

## 2019-10-21 NOTE — Telephone Encounter (Signed)
I called the patient and scheduled a BMET only lab and informed the patient that his medication was sent to the pharmacy.  Wesley Bridges,cma

## 2019-10-27 ENCOUNTER — Other Ambulatory Visit: Payer: BC Managed Care – PPO

## 2019-11-11 ENCOUNTER — Other Ambulatory Visit: Payer: Self-pay

## 2019-11-11 ENCOUNTER — Encounter: Payer: Self-pay | Admitting: Anesthesiology

## 2019-11-11 ENCOUNTER — Encounter: Payer: Self-pay | Admitting: Gastroenterology

## 2019-11-13 ENCOUNTER — Other Ambulatory Visit
Admission: RE | Admit: 2019-11-13 | Discharge: 2019-11-13 | Disposition: A | Discharge status: Home / Self Care | Payer: BC Managed Care – PPO | Source: Ambulatory Visit | Attending: Gastroenterology | Admitting: Gastroenterology

## 2019-11-13 DIAGNOSIS — Z01812 Encounter for preprocedural laboratory examination: Secondary | ICD-10-CM | POA: Diagnosis not present

## 2019-11-13 DIAGNOSIS — Z20822 Contact with and (suspected) exposure to covid-19: Secondary | ICD-10-CM | POA: Insufficient documentation

## 2019-11-13 LAB — SARS CORONAVIRUS 2 (TAT 6-24 HRS): SARS Coronavirus 2: NEGATIVE

## 2019-11-16 NOTE — Discharge Instructions (Signed)
General Anesthesia, Adult, Care After This sheet gives you information about how to care for yourself after your procedure. Your health care provider may also give you more specific instructions. If you have problems or questions, contact your health care provider. What can I expect after the procedure? After the procedure, the following side effects are common:  Pain or discomfort at the IV site.  Nausea.  Vomiting.  Sore throat.  Trouble concentrating.  Feeling cold or chills.  Weak or tired.  Sleepiness and fatigue.  Soreness and body aches. These side effects can affect parts of the body that were not involved in surgery. Follow these instructions at home:  For at least 24 hours after the procedure:  Have a responsible adult stay with you. It is important to have someone help care for you until you are awake and alert.  Rest as needed.  Do not: ? Participate in activities in which you could fall or become injured. ? Drive. ? Use heavy machinery. ? Drink alcohol. ? Take sleeping pills or medicines that cause drowsiness. ? Make important decisions or sign legal documents. ? Take care of children on your own. Eating and drinking  Follow any instructions from your health care provider about eating or drinking restrictions.  When you feel hungry, start by eating small amounts of foods that are soft and easy to digest (bland), such as toast. Gradually return to your regular diet.  Drink enough fluid to keep your urine pale yellow.  If you vomit, rehydrate by drinking water, juice, or clear broth. General instructions  If you have sleep apnea, surgery and certain medicines can increase your risk for breathing problems. Follow instructions from your health care provider about wearing your sleep device: ? Anytime you are sleeping, including during daytime naps. ? While taking prescription pain medicines, sleeping medicines, or medicines that make you drowsy.  Return to  your normal activities as told by your health care provider. Ask your health care provider what activities are safe for you.  Take over-the-counter and prescription medicines only as told by your health care provider.  If you smoke, do not smoke without supervision.  Keep all follow-up visits as told by your health care provider. This is important. Contact a health care provider if:  You have nausea or vomiting that does not get better with medicine.  You cannot eat or drink without vomiting.  You have pain that does not get better with medicine.  You are unable to pass urine.  You develop a skin rash.  You have a fever.  You have redness around your IV site that gets worse. Get help right away if:  You have difficulty breathing.  You have chest pain.  You have blood in your urine or stool, or you vomit blood. Summary  After the procedure, it is common to have a sore throat or nausea. It is also common to feel tired.  Have a responsible adult stay with you for the first 24 hours after general anesthesia. It is important to have someone help care for you until you are awake and alert.  When you feel hungry, start by eating small amounts of foods that are soft and easy to digest (bland), such as toast. Gradually return to your regular diet.  Drink enough fluid to keep your urine pale yellow.  Return to your normal activities as told by your health care provider. Ask your health care provider what activities are safe for you. This information is not   intended to replace advice given to you by your health care provider. Make sure you discuss any questions you have with your health care provider. Document Revised: 06/14/2017 Document Reviewed: 01/25/2017 Elsevier Patient Education  2020 [employer]  

## 2019-11-17 ENCOUNTER — Ambulatory Visit
Admission: RE | Admit: 2019-11-17 | Payer: BC Managed Care – PPO | Source: Home / Self Care | Admitting: Gastroenterology

## 2019-11-17 HISTORY — DX: Fracture of mandible, unspecified, initial encounter for closed fracture: S02.609A

## 2019-11-17 SURGERY — ESOPHAGOGASTRODUODENOSCOPY (EGD) WITH PROPOFOL
Anesthesia: Choice

## 2019-11-19 ENCOUNTER — Other Ambulatory Visit: Payer: BLUE CROSS/BLUE SHIELD

## 2019-12-30 ENCOUNTER — Ambulatory Visit: Payer: BLUE CROSS/BLUE SHIELD | Admitting: Family Medicine

## 2019-12-30 DIAGNOSIS — Z0289 Encounter for other administrative examinations: Secondary | ICD-10-CM

## 2020-01-11 ENCOUNTER — Other Ambulatory Visit: Payer: Self-pay | Admitting: Family Medicine

## 2020-01-20 ENCOUNTER — Ambulatory Visit: Payer: BC Managed Care – PPO | Admitting: Gastroenterology

## 2020-01-27 ENCOUNTER — Encounter: Payer: Self-pay | Admitting: Family Medicine

## 2020-01-29 MED ORDER — FLUTICASONE PROPIONATE 50 MCG/ACT NA SUSP: 2.0000 | Freq: Every day | NASAL | 11 refills | Status: DC

## 2020-01-29 MED ORDER — TERBINAFINE HCL 250 MG PO TABS: 250.0000 mg | ORAL_TABLET | Freq: Every day | ORAL | 2 refills | Status: DC

## 2020-03-07 ENCOUNTER — Ambulatory Visit: Payer: BC Managed Care – PPO | Admitting: Surgery

## 2020-03-16 ENCOUNTER — Telehealth: Payer: Self-pay | Admitting: Family Medicine

## 2020-03-16 NOTE — Telephone Encounter (Signed)
Pt dropped off handicap placard app to be completed. Placed in folder up front. Please call pt to pick up when finished.

## 2020-03-16 NOTE — Telephone Encounter (Signed)
Put in sign basket.    Pt dropped off handicap placard app to be completed. Placed in folder up front. Please call pt to pick up when finished.  Wesley Bridges,cma

## 2020-03-19 NOTE — Telephone Encounter (Signed)
Signed. Please fill in the rest of the information on the form.

## 2020-03-21 NOTE — Telephone Encounter (Signed)
I called and LVM for the patient to come and pick up his Handicap form it is ready.  Nana Vastine,cma

## 2020-03-22 ENCOUNTER — Other Ambulatory Visit: Payer: Self-pay | Admitting: Family Medicine

## 2020-03-22 DIAGNOSIS — I10 Essential (primary) hypertension: Secondary | ICD-10-CM

## 2020-04-07 ENCOUNTER — Encounter: Payer: Self-pay | Admitting: *Deleted

## 2020-04-14 ENCOUNTER — Encounter: Payer: Self-pay | Admitting: Family Medicine

## 2020-04-14 ENCOUNTER — Other Ambulatory Visit: Payer: Self-pay

## 2020-04-14 MED ORDER — VENLAFAXINE HCL 100 MG PO TABS: 100.0000 mg | ORAL_TABLET | Freq: Two times a day (BID) | ORAL | 1 refills | Status: DC

## 2020-04-18 DIAGNOSIS — R202 Paresthesia of skin: Secondary | ICD-10-CM | POA: Insufficient documentation

## 2020-05-01 ENCOUNTER — Other Ambulatory Visit: Payer: Self-pay

## 2020-05-01 ENCOUNTER — Emergency Department
Admission: EM | Admit: 2020-05-01 | Discharge: 2020-05-01 | Disposition: A | Discharge status: Home / Self Care | Payer: BC Managed Care – PPO | Attending: Emergency Medicine | Admitting: Emergency Medicine

## 2020-05-01 DIAGNOSIS — S61012A Laceration without foreign body of left thumb without damage to nail, initial encounter: Secondary | ICD-10-CM | POA: Diagnosis not present

## 2020-05-01 DIAGNOSIS — I10 Essential (primary) hypertension: Secondary | ICD-10-CM | POA: Diagnosis not present

## 2020-05-01 DIAGNOSIS — Z79899 Other long term (current) drug therapy: Secondary | ICD-10-CM | POA: Insufficient documentation

## 2020-05-01 DIAGNOSIS — Z23 Encounter for immunization: Secondary | ICD-10-CM | POA: Insufficient documentation

## 2020-05-01 DIAGNOSIS — S6992XA Unspecified injury of left wrist, hand and finger(s), initial encounter: Secondary | ICD-10-CM | POA: Diagnosis present

## 2020-05-01 DIAGNOSIS — W260XXA Contact with knife, initial encounter: Secondary | ICD-10-CM | POA: Insufficient documentation

## 2020-05-01 DIAGNOSIS — J45909 Unspecified asthma, uncomplicated: Secondary | ICD-10-CM | POA: Diagnosis not present

## 2020-05-01 DIAGNOSIS — Z7982 Long term (current) use of aspirin: Secondary | ICD-10-CM | POA: Diagnosis not present

## 2020-05-01 MED ORDER — TETANUS-DIPHTH-ACELL PERTUSSIS 5-2.5-18.5 LF-MCG/0.5 IM SUSY
0.5000 mL | PREFILLED_SYRINGE | Freq: Once | INTRAMUSCULAR | Status: AC
  Administered 2020-05-01: 0.5 mL via INTRAMUSCULAR
  Filled 2020-05-01: qty 0.5

## 2020-05-01 MED ORDER — CEPHALEXIN 500 MG PO CAPS: 500.0000 mg | ORAL_CAPSULE | Freq: Four times a day (QID) | ORAL | 0 refills | Status: AC

## 2020-05-01 MED ORDER — LIDOCAINE HCL (PF) 1 % IJ SOLN
5.0000 mL | Freq: Once | INTRAMUSCULAR | Status: DC
  Filled 2020-05-01: qty 5

## 2020-05-01 NOTE — ED Triage Notes (Signed)
Pt lacerated left thumb with box cutter. Needs TDAP per pt report. Bleeding controlled at this time

## 2020-05-01 NOTE — ED Notes (Signed)
Pt lac dressed with non adherent bandage and wrapped with gauze

## 2020-05-01 NOTE — ED Provider Notes (Signed)
[facility] Emergency Department Provider Note  ____________________________________________  Time seen: Approximately 3:29 PM  I have reviewed the triage vital signs and the nursing notes.   HISTORY  Chief Complaint Laceration    HPI Wesley Bridges is a 49 y.o. male that presents to the emergency department for evaluation of left thumb laceration.  Patient cut his thumb this afternoon with a box cutter.  He needs an updated tetanus.  Past Medical History:  Diagnosis Date  . Anginal pain ([place])   . Arthritis   . Asthma   . Broken jaw ([place])    approx 25 yrs afo.  some limitations opening mouth  . Chronic headaches   . Complication of anesthesia    takes longer to wake up  . Depression   . Dyspnea   . Dysrhythmia   . Family history of adverse reaction to anesthesia    son takes longer to wake up than a normal person  . GERD (gastroesophageal reflux disease)   . Hyperlipidemia   . Hypertension   . Sleep apnea    cpap  . Subclinical hyperthyroidism     Patient Active Problem List   Diagnosis Date Noted  . Obesity (BMI 30.0-34.9) 10/07/2019  . Onychomycosis 10/07/2019  . Loose body of right elbow 08/07/2019  . Complex tear of medial meniscus of left knee as current injury 03/23/2019  . Chronic daily headache 01/12/2019  . Degenerative joint disease of elbow, right 12/08/2018  . Chronic tension-type headache, intractable 12/01/2018  . OSA (obstructive sleep apnea) 11/05/2018  . Prediabetes 11/05/2018  . Chondromalacia of right patella 10/23/2018  . Arthritis 07/22/2018  . Sweating abnormality 07/22/2018  . Hypersomnia 07/22/2018  . Right lateral epicondylitis 07/14/2018  . Encounter for long-term (current) use of high-risk medication 07/10/2018  . Headache disorder 04/10/2018  . Numbness and tingling 04/10/2018  . Elevated urine levels of catecholamines 04/03/2018  . Right elbow pain 04/03/2018  . Paresthesia 03/05/2018  . Chronic  abdominal pain 07/29/2017  . Vision changes 07/29/2017  . Low TSH level 02/18/2017  . Major depression, single episode 09/16/2016  . Palpitations 09/16/2016  . Inguinal hernia 09/16/2016  . Fibrosis of subtalar joint, right 07/16/2016  . Sprain of right ankle 05/24/2016  . Joint pain 04/18/2016  . Enlarged prostate 03/22/2016  . Lightheadedness 12/15/2015  . Vasovagal syncope 12/06/2015  . Injury of toe on left foot 10/31/2015  . Right ankle swelling 10/31/2015  . Esophageal reflux 10/31/2015  . Numbness 10/31/2015  . Hyperlipidemia 04/13/2015  . Essential hypertension 03/18/2015  . Headache 03/18/2015  . Chest pain 03/18/2015    Past Surgical History:  Procedure Laterality Date  . ANKLE FRACTURE SURGERY    . ANKLE FUSION     [facility]  . CARDIAC CATHETERIZATION     no stent  . ELBOW ARTHROSCOPY Right 09/01/2019   Procedure: RIGHT ELBOW ARTHROSCOPY WITH DEBRIDEMENT AND EXCISION OF LOOSE BODIES;  Surgeon: Corky Mull, MD;  Location: [facility] ORS;  Service: Orthopedics;  Laterality: Right;  . FEMUR FRACTURE SURGERY    . FRACTURE SURGERY    . HERNIA REPAIR Bilateral    inguinal  . I & D EXTREMITY Right 07/30/2018   Procedure: DEBRIDEMENT OF THE COMMON EXTENSOR ORIGIN OF ELBOW;  Surgeon: Corky Mull, MD;  Location: [place];  Service: Orthopedics;  Laterality: Right;  1.45 BIOMET JUGGERKNOT ANCHORS  . KNEE ARTHROSCOPY WITH MEDIAL MENISECTOMY Left 05/13/2019   Procedure: KNEE ARTHROSCOPY WITH DEBRIDEMENT AND REPAIR CH;  Surgeon: Corky Mull, MD;  Location: [facility];  Service: Orthopedics;  Laterality: Left;  . LEFT HEART CATH AND CORONARY ANGIOGRAPHY N/A 01/08/2017   Procedure: Left Heart Cath and Coronary Angiography;  Surgeon: Nelva Bush, MD;  Location: [place] [facility];  Service: Cardiovascular;  Laterality: N/A;  . MANDIBLE FRACTURE SURGERY    . ROBOT ASSISTED INGUINAL HERNIA REPAIR Bilateral 04/17/2018   Procedure: ROBOT ASSISTED INGUINAL HERNIA REPAIR;   Surgeon: Jules Husbands, MD;  Location: [facility] OR[facility];  Service: General;  Laterality: Bilateral;    Prior to Admission medications   Medication Sig Start Date End Date Taking? Authorizing Provider  AJOVY 225 MG/1.5ML SOSY Inject 225 mg into the skin every 28 (twenty-eight) days.  08/18/18   [provider]  aspirin EC 81 MG tablet Take 81 mg by mouth daily.    [provider]  atorvastatin (LIPITOR) 40 MG tablet Take 1 tablet (40 mg total) by mouth daily. 10/07/19   Leone Haven, MD  carvedilol (COREG) 12.5 MG tablet TAKE 1 TABLET TWICE DAILY  WITH MEALS 03/22/20   Leone Haven, MD  cephALEXin (KEFLEX) 500 MG capsule Take 1 capsule (500 mg total) by mouth 4 (four) times daily for 10 days. 05/01/20 05/11/20  Laban Emperor, PA-C  fluticasone (FLONASE) 50 MCG/ACT nasal spray Place 2 sprays into both nostrils daily. 01/29/20   Crecencio Mc, MD  HYDROcodone-acetaminophen (NORCO) 5-325 MG tablet Take 1-2 tablets by mouth every 6 (six) hours as needed for moderate pain or severe pain. MAXIMUM TOTAL ACETAMINOPHEN DOSE IS 4000 MG PER DAY Patient not taking: Reported on 11/11/2019 09/01/19   Poggi, Marshall Cork, MD  hydroxychloroquine (PLAQUENIL) 200 MG tablet Take 200 mg by mouth 2 (two) times daily.    [provider]  ibuprofen (ADVIL) 800 MG tablet Take 800 mg by mouth 3 (three) times daily as needed for moderate pain.  01/23/19   [provider]  loratadine (CLARITIN) 10 MG tablet Take 1 tablet (10 mg total) by mouth daily. Patient taking differently: Take 20 mg by mouth daily.  04/18/16   Leone Haven, MD  losartan (COZAAR) 100 MG tablet TAKE 1 TABLET DAILY        (DISCONTINUE LISINOPRIL    PRESCRIPTION) 03/22/20   Leone Haven, MD  Multiple Vitamin (MULTIVITAMIN WITH MINERALS) TABS tablet Take 1 tablet by mouth at bedtime.    [provider]  nitroGLYCERIN (NITROSTAT) 0.4 MG SL tablet Place 1 tablet (0.4 mg total) under the tongue every 5 (five)  minutes as needed for chest pain. 06/05/18   End, Harrell Gave, MD  nortriptyline (PAMELOR) 50 MG capsule Take 50 mg by mouth at bedtime.     [provider]  pantoprazole (PROTONIX) 40 MG tablet Take 1 tablet (40 mg total) by mouth daily. 10/07/19   Leone Haven, MD  terbinafine (LAMISIL) 250 MG tablet Take 1 tablet (250 mg total) by mouth daily. 01/29/20   Crecencio Mc, MD  topiramate (TOPAMAX) 50 MG tablet Take 100-125 mg by mouth See admin instructions. Take 100 mg by mouth in the morning and 125 mg in the evening 01/12/19   [provider]  venlafaxine (EFFEXOR) 100 MG tablet Take 1 tablet (100 mg total) by mouth 2 (two) times daily. 04/14/20   Leone Haven, MD    Allergies Pollen extract and Bee pollen  Family History  Problem Relation Age of Onset  . Arthritis Other  parents  . Hypertension Mother   . Thyroid disease Mother   . Osteoporosis Mother   . Syncope episode Mother   . Hypertension Brother     Social History Social History   Tobacco Use  . Smoking status: Never Smoker  . Smokeless tobacco: Never Used  Vaping Use  . Vaping Use: Never used  Substance Use Topics  . Alcohol use: Yes    Alcohol/week: 14.0 standard drinks    Types: 14 Cans of beer per week    Comment: 2-3 a day   . Drug use: No     Review of Systems  Constitutional: No fever/chills Gastrointestinal: No nausea, no vomiting.  Musculoskeletal: Positive for thumb pain. Skin: Negative for rash, ecchymosis. Positive for laceration. Neurological: Negative for numbness or tingling   ____________________________________________   PHYSICAL EXAM:  VITAL SIGNS: ED Triage Vitals  Enc Vitals Group     BP 05/01/20 1427 (!) 139/92     Pulse Rate 05/01/20 1427 71     Resp 05/01/20 1427 18     Temp 05/01/20 1427 99.1 F (37.3 C)     Temp Source 05/01/20 1427 Oral     SpO2 05/01/20 1427 99 %     Weight 05/01/20 1417 210 lb (95.3 kg)     Height --      Head  Circumference --      Peak Flow --      Pain Score 05/01/20 1417 6     Pain Loc --      Pain Edu? --      Excl. in [place]? --      Constitutional: Alert and oriented. Well appearing and in no acute distress. Eyes: Conjunctivae are normal. PERRL. EOMI. Head: Atraumatic. ENT:      Ears:      Nose: No congestion/rhinnorhea.      Mouth/Throat: Mucous membranes are moist.  Neck: No stridor.  Cardiovascular: Normal rate, regular rhythm.  Good peripheral circulation. Respiratory: Normal respiratory effort without tachypnea or retractions. Lungs CTAB. Good air entry to the bases with no decreased or absent breath sounds. Gastrointestinal: Bowel sounds 4 quadrants. Soft and nontender to palpation. No guarding or rigidity. No palpable masses. No distention.  Musculoskeletal: Full range of motion to all extremities. No gross deformities appreciated.  Full range of motion of left thumb. Neurologic:  Normal speech and language. No gross focal neurologic deficits are appreciated.  Skin:  Skin is warm, dry.  1 cm laceration to left dorsal proximal thumb. Psychiatric: Mood and affect are normal. Speech and behavior are normal. Patient exhibits appropriate insight and judgement.   ____________________________________________   LABS (all labs ordered are listed, but only abnormal results are displayed)  Labs Reviewed - No data to display ____________________________________________  EKG   ____________________________________________  RADIOLOGY   No results found.  ____________________________________________    PROCEDURES  Procedure(s) performed:    Procedures  LACERATION REPAIR Performed by: Laban Emperor  Consent: Verbal consent obtained.  Consent given by: patient  Prepped and Draped in normal sterile fashion  Wound explored: No foreign bodies   Laceration Location: thumb  Laceration Length: 1 cm  Anesthesia: None  Local anesthetic: lidocaine 1% without  epinephrine  Anesthetic total: 2 ml  Irrigation method: syringe  Amount of cleaning: 537ml normal saline  Skin closure: 4-0 nylon  Number of sutures: 4  Technique: Simple interrupted  Patient tolerance: Patient tolerated the procedure well with no immediate complications.  Medications  lidocaine (PF) (XYLOCAINE) 1 %  injection 5 mL (has no administration in time range)  Tdap (BOOSTRIX) injection 0.5 mL (0.5 mLs Intramuscular Given 05/01/20 1535)     ____________________________________________   INITIAL IMPRESSION / ASSESSMENT AND PLAN / ED COURSE  Pertinent labs & imaging results that were available during my care of the patient were reviewed by me and considered in my medical decision making (see chart for details).  Review of the [place] CSRS was performed in accordance of the [place] prior to dispensing any controlled drugs.   Patient presented to the emergency department for evaluation of thumb laceration.  Vital signs and exam are reassuring.  Laceration was repaired with sutures.  Tetanus shot was updated.  Patient will be discharged home with prescriptions for Keflex.  Patient is to follow up with primary care as directed. Patient is given ED precautions to return to the ED for any worsening or new symptoms.  Wesley Bridges was evaluated in Emergency Department on 05/01/2020 for the symptoms described in the history of present illness. He was evaluated in the context of the global COVID-19 pandemic, which necessitated consideration that the patient might be at risk for infection with the SARS-CoV-2 virus that causes COVID-19. Institutional protocols and algorithms that pertain to the evaluation of patients at risk for COVID-19 are in a state of rapid change based on information released by regulatory bodies including the CDC and federal and state organizations. These policies and algorithms were followed during the patient's care in the  ED.   ____________________________________________  FINAL CLINICAL IMPRESSION(S) / ED DIAGNOSES  Final diagnoses:  Laceration of left thumb without foreign body without damage to nail, initial encounter      NEW MEDICATIONS STARTED DURING THIS VISIT:  ED Discharge Orders         Ordered    cephALEXin (KEFLEX) 500 MG capsule  4 times daily        05/01/20 1646              This chart was dictated using voice recognition software/Dragon. Despite best efforts to proofread, errors can occur which can change the meaning. Any change was purely unintentional.    Laban Emperor, PA-C 05/01/20 1715    Lucrezia Starch, MD 05/02/20 [phone]

## 2020-07-25 HISTORY — PX: OTHER SURGICAL HISTORY: SHX169

## 2020-08-24 ENCOUNTER — Other Ambulatory Visit: Payer: Self-pay | Admitting: Internal Medicine

## 2020-08-24 ENCOUNTER — Encounter: Payer: Self-pay | Admitting: Family Medicine

## 2020-08-25 ENCOUNTER — Other Ambulatory Visit: Payer: Self-pay

## 2020-08-25 MED ORDER — TERBINAFINE HCL 250 MG PO TABS: 250.0000 mg | ORAL_TABLET | Freq: Every day | ORAL | 2 refills | Status: DC

## 2020-09-06 ENCOUNTER — Encounter: Payer: Self-pay | Admitting: *Deleted

## 2020-09-06 ENCOUNTER — Other Ambulatory Visit: Payer: Self-pay | Admitting: Family Medicine

## 2020-09-19 ENCOUNTER — Other Ambulatory Visit: Payer: Self-pay | Admitting: Family Medicine

## 2020-09-19 DIAGNOSIS — I10 Essential (primary) hypertension: Secondary | ICD-10-CM

## 2020-10-27 ENCOUNTER — Other Ambulatory Visit: Payer: Self-pay

## 2020-11-02 ENCOUNTER — Other Ambulatory Visit: Payer: Self-pay

## 2020-11-02 ENCOUNTER — Encounter: Payer: Self-pay | Admitting: Family Medicine

## 2020-11-02 ENCOUNTER — Ambulatory Visit (INDEPENDENT_AMBULATORY_CARE_PROVIDER_SITE_OTHER): Payer: BC Managed Care – PPO | Admitting: Family Medicine

## 2020-11-02 VITALS — BP 121/70 | HR 59 | Temp 98.2°F | Ht 66.0 in | Wt 222.2 lb

## 2020-11-02 DIAGNOSIS — R7303 Prediabetes: Secondary | ICD-10-CM | POA: Diagnosis not present

## 2020-11-02 DIAGNOSIS — F322 Major depressive disorder, single episode, severe without psychotic features: Secondary | ICD-10-CM | POA: Diagnosis not present

## 2020-11-02 DIAGNOSIS — M25561 Pain in right knee: Secondary | ICD-10-CM | POA: Diagnosis not present

## 2020-11-02 DIAGNOSIS — M654 Radial styloid tenosynovitis [de Quervain]: Secondary | ICD-10-CM | POA: Diagnosis not present

## 2020-11-02 DIAGNOSIS — E785 Hyperlipidemia, unspecified: Secondary | ICD-10-CM

## 2020-11-02 DIAGNOSIS — G8929 Other chronic pain: Secondary | ICD-10-CM

## 2020-11-02 DIAGNOSIS — I10 Essential (primary) hypertension: Secondary | ICD-10-CM | POA: Diagnosis not present

## 2020-11-02 MED ORDER — CARVEDILOL 6.25 MG PO TABS: 6.2500 mg | ORAL_TABLET | Freq: Two times a day (BID) | ORAL | 1 refills | Status: DC

## 2020-11-02 NOTE — Assessment & Plan Note (Signed)
Check A1c. 

## 2020-11-02 NOTE — Assessment & Plan Note (Signed)
The patient continues to have depression.  He will continue on his Effexor.  Will refer to psychiatry to get their input.

## 2020-11-02 NOTE — Patient Instructions (Signed)
Nice to see you. Please return next week for your x-ray. We will get lab work today and contact you with the results. Somebody from psychiatry and orthopedics should contact you to schedule an appointment.  If you do not hear anything within a week or 2 please let us know. We are going to decrease the dose of your carvedilol to see if that helps with the lightheadedness.

## 2020-11-02 NOTE — Assessment & Plan Note (Addendum)
This appears to be bilateral.  Discussed appropriate icing regimen of 10 minutes 3 times a day.  It appears he had a normal serologic work-up with rheumatology previously.  Refer to orthopedics to consider injection.

## 2020-11-02 NOTE — Assessment & Plan Note (Addendum)
Has been well controlled at home and is sometimes borderline low.  He is not orthostatic today though he does report apparent symptoms of orthostasis.  We will decrease his carvedilol to 6.25 mg twice daily.  He will continue losartan 100 mg daily.  Lab work today.

## 2020-11-02 NOTE — Progress Notes (Signed)
[name], MD Phone: [phone]  Wesley Bridges is a 50 y.o. male who presents today for f/u.  HYPERTENSION  Disease Monitoring  Home BP Monitoring similar to today or a little lower Chest pain- no change to chronic noncardiac pain    Dyspnea- no Medications  Compliance-  Taking coreg, losartan. Lightheadedness-  yes  Edema- no  Depression: Chronic issues with this.  He notes this is relatively stable though notes its not well controlled.  He continues on Effexor.  No SI.  Notes he alternates between days of crying and other days of being fine.  He did see psychiatry last year though reports the physician left the practice.  Chronic pain: Patient notes chronic hand pain bilaterally.  Reports he saw a rheumatologist and had blood work last year.  Notes the pain is getting worse.  He notes he was advised it was arthritis.  Hurts in his thumbs and his wrists.  He is dropping stuff due to the pain.  Over-the-counter medication such as ibuprofen do not help.  He also has chronic right elbow pain as well as bilateral knee pain.  Notes his right knee hurts greater than his left.  He has had surgery on his left knee previously.  Social History   Tobacco Use  Smoking Status Never Smoker  Smokeless Tobacco Never Used    Current Outpatient Medications on File Prior to Visit  Medication Sig Dispense Refill  . AJOVY 225 MG/1.5ML SOSY Inject 225 mg into the skin every 28 (twenty-eight) days.     Marland Kitchen aspirin EC 81 MG tablet Take 81 mg by mouth daily.    Marland Kitchen atorvastatin (LIPITOR) 40 MG tablet TAKE 1 TABLET DAILY 90 tablet 0  . clonazePAM (KLONOPIN) 0.5 MG tablet Take 0.25 mg by mouth 2 (two) times daily.    . fluticasone (FLONASE) 50 MCG/ACT nasal spray Place 2 sprays into both nostrils daily. 18 g 11  . HYDROcodone-acetaminophen (NORCO) 5-325 MG tablet Take 1-2 tablets by mouth every 6 (six) hours as needed for moderate pain or severe pain. MAXIMUM TOTAL ACETAMINOPHEN DOSE IS 4000 MG PER  DAY 30 tablet 0  . ibuprofen (ADVIL) 800 MG tablet Take 800 mg by mouth 3 (three) times daily as needed for moderate pain.     Marland Kitchen loratadine (CLARITIN) 10 MG tablet Take 1 tablet (10 mg total) by mouth daily. (Patient taking differently: Take 20 mg by mouth daily.) 30 tablet 11  . losartan (COZAAR) 100 MG tablet TAKE 1 TABLET DAILY        (DISCONTINUE LISINOPRIL    PRESCRIPTION) 90 tablet 1  . Multiple Vitamin (MULTIVITAMIN WITH MINERALS) TABS tablet Take 1 tablet by mouth at bedtime.    . nitroGLYCERIN (NITROSTAT) 0.4 MG SL tablet Place 1 tablet (0.4 mg total) under the tongue every 5 (five) minutes as needed for chest pain. 25 tablet 0  . nortriptyline (PAMELOR) 50 MG capsule Take 50 mg by mouth at bedtime.     . pantoprazole (PROTONIX) 40 MG tablet TAKE 1 TABLET DAILY 90 tablet 0  . terbinafine (LAMISIL) 250 MG tablet TAKE 1 TABLET BY MOUTH EVERY DAY 28 tablet 2  . terbinafine (LAMISIL) 250 MG tablet Take 1 tablet (250 mg total) by mouth daily. 28 tablet 2  . topiramate (TOPAMAX) 50 MG tablet Take 100-125 mg by mouth See admin instructions. Take 100 mg by mouth in the morning and 125 mg in the evening    . venlafaxine (EFFEXOR) 100 MG tablet TAKE 1  TABLET TWICE A DAY 180 tablet 0  . hydroxychloroquine (PLAQUENIL) 200 MG tablet Take 200 mg by mouth 2 (two) times daily.     No current facility-administered medications on file prior to visit.     ROS see history of present illness  Objective  Physical Exam Vitals:   11/02/20 1538  BP: 121/70  Pulse: (!) 59  Temp: 98.2 F (36.8 C)  SpO2: 96%   Laying blood pressure 139/87 pulse 69 Sitting blood pressure 154/102 pulse 74 Standing blood pressure 142/99 pulse 77  BP Readings from Last 3 Encounters:  11/02/20 121/70  05/01/20 (!) 140/96  10/07/19 120/60   Wt Readings from Last 3 Encounters:  11/02/20 222 lb 3.2 oz (100.8 kg)  05/01/20 210 lb (95.3 kg)  10/07/19 208 lb 9.6 oz (94.6 kg)    Physical Exam Constitutional:       General: He is not in acute distress.    Appearance: He is not diaphoretic.  Cardiovascular:     Rate and Rhythm: Normal rate and regular rhythm.     Heart sounds: Normal heart sounds.  Pulmonary:     Effort: Pulmonary effort is normal.     Breath sounds: Normal breath sounds.  Musculoskeletal:     Right lower leg: No edema.     Left lower leg: No edema.     Comments: Bilateral hands and wrist with scattered tenderness, there is tenderness over the radial aspect of his bilateral wrists and thumb, positive Finkelstein's bilaterally, negative Tinel's bilaterally, right knee with no swelling, warmth, or erythema, there is medial joint line tenderness, there is discomfort on McMurray's though no palpable popping  Skin:    General: Skin is warm and dry.  Neurological:     Mental Status: He is alert.      Assessment/Plan: Please see individual problem list.  Problem List Items Addressed This Visit    Essential hypertension    Has been well controlled at home and is sometimes borderline low.  He is not orthostatic today though he does report apparent symptoms of orthostasis.  We will decrease his carvedilol to 6.25 mg twice daily.  He will continue losartan 100 mg daily.  Lab work today.      Relevant Medications   carvedilol (COREG) 6.25 MG tablet   Other Relevant Orders   Comp Met (CMET)   Hyperlipidemia    Check lipid panel.      Relevant Medications   carvedilol (COREG) 6.25 MG tablet   Other Relevant Orders   Lipid panel   Major depression, single episode - Primary    The patient continues to have depression.  He will continue on his Effexor.  Will refer to psychiatry to get their input.      Relevant Orders   Ambulatory referral to Psychiatry   Prediabetes    Check A1c.      Relevant Orders   HgB A1c   De Quervain's tenosynovitis, bilateral    This appears to be bilateral.  Discussed appropriate icing regimen of 10 minutes 3 times a day.  It appears he had a normal  serologic work-up with rheumatology previously.  Refer to orthopedics to consider injection.      Relevant Orders   Ambulatory referral to Orthopedic Surgery   Right knee pain    The patient has a possible meniscal issue.  We will get an x-ray.  Consider orthopedic evaluation once the x-ray returns.      Relevant Orders   DG Knee  Complete 4 Views Right      Return in about 6 weeks (around 12/14/2020) for Follow-up blood pressure.  This visit occurred during the SARS-CoV-2 public health emergency.  Safety protocols were in place, including screening questions prior to the visit, additional usage of staff PPE, and extensive cleaning of exam room while observing appropriate contact time as indicated for disinfecting solutions.    [name], MD [place]

## 2020-11-02 NOTE — Assessment & Plan Note (Signed)
Check lipid panel  

## 2020-11-02 NOTE — Assessment & Plan Note (Signed)
The patient has a possible meniscal issue.  We will get an x-ray.  Consider orthopedic evaluation once the x-ray returns.

## 2020-11-03 LAB — COMPREHENSIVE METABOLIC PANEL
ALT: 24 U/L (ref 0–53)
AST: 16 U/L (ref 0–37)
Albumin: 4 g/dL (ref 3.5–5.2)
Alkaline Phosphatase: 80 U/L (ref 39–117)
BUN: 9 mg/dL (ref 6–23)
CO2: 25 mEq/L (ref 19–32)
Calcium: 9 mg/dL (ref 8.4–10.5)
Chloride: 110 mEq/L (ref 96–112)
Creatinine, Ser: 1.1 mg/dL (ref 0.40–1.50)
GFR: 77.88 mL/min (ref >60.00)
Glucose, Bld: 94 mg/dL (ref 70–99)
Potassium: 4 mEq/L (ref 3.5–5.1)
Sodium: 142 mEq/L (ref 135–145)
Total Bilirubin: 0.4 mg/dL (ref 0.2–1.2)
Total Protein: 6.7 g/dL (ref 6.0–8.3)

## 2020-11-03 LAB — LIPID PANEL
Cholesterol: 186 mg/dL (ref 0–200)
HDL: 48.3 mg/dL (ref >39.00)
LDL Cholesterol: 118 mg/dL — ABNORMAL HIGH (ref 0–99)
NonHDL: 137.56
Total CHOL/HDL Ratio: 4
Triglycerides: 99 mg/dL (ref 0.0–149.0)
VLDL: 19.8 mg/dL (ref 0.0–40.0)

## 2020-11-03 LAB — HEMOGLOBIN A1C: Hgb A1c MFr Bld: 6 % (ref 4.6–6.5)

## 2020-11-09 ENCOUNTER — Other Ambulatory Visit: Payer: Self-pay | Admitting: Family Medicine

## 2020-11-13 ENCOUNTER — Other Ambulatory Visit: Payer: Self-pay | Admitting: Family Medicine

## 2020-11-16 ENCOUNTER — Other Ambulatory Visit: Payer: Self-pay | Admitting: Family Medicine

## 2020-11-19 ENCOUNTER — Other Ambulatory Visit: Payer: Self-pay | Admitting: Family Medicine

## 2020-11-24 ENCOUNTER — Encounter: Payer: Self-pay | Admitting: Family Medicine

## 2020-11-24 NOTE — Telephone Encounter (Signed)
PT called in to advise he tested positive for covid and was wondering if Dr.Sonnenberg can have something called in for this.

## 2020-11-25 ENCOUNTER — Other Ambulatory Visit: Payer: Self-pay | Admitting: Family Medicine

## 2020-11-25 DIAGNOSIS — E785 Hyperlipidemia, unspecified: Secondary | ICD-10-CM

## 2020-11-25 MED ORDER — ATORVASTATIN CALCIUM 80 MG PO TABS: 80.0000 mg | ORAL_TABLET | Freq: Every day | ORAL | 3 refills | Status: DC

## 2020-11-25 NOTE — Telephone Encounter (Signed)
It is unclear if the patient went to get evaluated as advised yesterday. Can you please follow-up with him on this? Given his shortness of breath he really needs to be seen in person for an evaluation and urgent care would be the best place to start for this.

## 2020-12-15 ENCOUNTER — Telehealth: Payer: Self-pay | Admitting: Family Medicine

## 2020-12-15 DIAGNOSIS — M654 Radial styloid tenosynovitis [de Quervain]: Secondary | ICD-10-CM

## 2020-12-15 NOTE — Telephone Encounter (Signed)
Rejection Reason - Other - Pt has a balance and will need to be seen elsewhere" Grayland Jack said on Dec 15, 2020 3:38 PM  "Gave pt number to call to discuss acct balance" Grayland Jack said on Nov 24, 2020 12:34 PM  "Pt has a balance on his acct. Will await the ok to schedule " Grayland Jack said on Nov 14, 2020 4:30 PM  Msg from [facility] orthopedic surgery

## 2020-12-16 NOTE — Telephone Encounter (Signed)
Please let the patient know that we tried to get him scheduled at [facility] clinic for orthopedics and it appears he has a balance on his account there.  If he would like to go there for his orthopedic care he needs to contact them to discuss the account balance.  If he would prefer we can refer him somewhere else as well.

## 2020-12-16 NOTE — Addendum Note (Signed)
Addended by: Caryl Bis, Beautiful Pensyl G on: 12/16/2020 01:06 PM   Modules accepted: Orders

## 2020-12-16 NOTE — Telephone Encounter (Signed)
Referral placed to a different orthopedist.

## 2020-12-17 ENCOUNTER — Other Ambulatory Visit: Payer: Self-pay | Admitting: Family Medicine

## 2020-12-28 ENCOUNTER — Telehealth: Payer: Self-pay | Admitting: Family Medicine

## 2020-12-28 NOTE — Telephone Encounter (Signed)
Rejection Reason - Patient did not respond - We have contacted this patient 2 times, left 2 messages and mailed a letter. This patient has not contacted [place] back to schedule. Referral is being closed due to time sensitivity but pt has our info to call and schedule. We will still be happy to assist your office and the patient. Thank you!" Virl Cagey said on Dec 28, 2020 8:26 AM  "Thank you for the referral. We have left a voicemail for this patient to contact our office to schedule an appointment. We will keep you updated as we can. Thank you" Virl Cagey said on Dec 19, 2020 8:40 AM  Msg from [facility]

## 2020-12-30 NOTE — Telephone Encounter (Signed)
Noted.  Please contact the patient and see if he still wants to see orthopedics for his wrists.  Thanks.

## 2021-01-02 NOTE — Telephone Encounter (Signed)
I called and spoke with the patient and he stated he has an appointment scheduled with [facility]. Kashawna Manzer,cma

## 2021-01-09 ENCOUNTER — Other Ambulatory Visit: Payer: Medicare Other

## 2021-02-17 ENCOUNTER — Other Ambulatory Visit: Payer: Self-pay | Admitting: Internal Medicine

## 2021-03-10 ENCOUNTER — Other Ambulatory Visit: Payer: Self-pay | Admitting: Family Medicine

## 2021-03-10 DIAGNOSIS — I10 Essential (primary) hypertension: Secondary | ICD-10-CM

## 2021-03-10 NOTE — Telephone Encounter (Signed)
Called to speak with Wesley Bridges and confirm medication dosage. Pt states that he is currently taking Atorvastatin '80mg'$  and Carvedilol 6.'25mg'$  and does not need Carvedilol 12.'5mg'$  or Atorvastatin 40 mg sent in. Pt states that he has enough medication to last him until 03/15/21. Please advise? Ok to cancel Atorvastatin 40 mg and Carvedilol 12.'5mg'$ ?

## 2021-03-13 NOTE — Telephone Encounter (Signed)
Meds refilled. Canceled request for the incorrect doses of lipitor and coreg. He is due for follow up. Please advise him to schedule this for the next available 30 minute slot.

## 2021-03-14 NOTE — Telephone Encounter (Signed)
Called and scheduled Wesley Bridges on 04/26/21 for a Follow up with Dr. Caryl Bis.

## 2021-03-27 ENCOUNTER — Other Ambulatory Visit: Payer: Self-pay | Admitting: Family Medicine

## 2021-04-06 ENCOUNTER — Telehealth: Payer: Self-pay | Admitting: Family Medicine

## 2021-04-06 NOTE — Telephone Encounter (Signed)
Noted.  Can you reach out to the patient and see if he still wants to see psychiatry?  He was previously referred to them though it looks like he did not return their call to schedule an appointment.  If he wants to see them please provide him with the number in the note below.

## 2021-04-06 NOTE — Telephone Encounter (Signed)
In reviewing paperwork referral return for DR. Kapur patient did not schedule, Patient has an Appointment with PCP on 04/26/21 if patient would still like appointment can call [phone]

## 2021-04-07 NOTE — Telephone Encounter (Signed)
I called and spoke with the patient and he will call and schedule, he still wants to see psychiatry I gave the patent the phone number to call.    Oluwadamilare Tobler,cma

## 2021-04-09 NOTE — Telephone Encounter (Signed)
Noted  

## 2021-04-26 ENCOUNTER — Other Ambulatory Visit: Payer: Self-pay

## 2021-04-26 ENCOUNTER — Encounter: Payer: Self-pay | Admitting: Family Medicine

## 2021-04-26 ENCOUNTER — Ambulatory Visit (INDEPENDENT_AMBULATORY_CARE_PROVIDER_SITE_OTHER): Payer: BC Managed Care – PPO | Admitting: Family Medicine

## 2021-04-26 VITALS — BP 140/80 | HR 66 | Temp 98.8°F | Ht 66.0 in | Wt 208.6 lb

## 2021-04-26 DIAGNOSIS — Z23 Encounter for immunization: Secondary | ICD-10-CM | POA: Diagnosis not present

## 2021-04-26 DIAGNOSIS — M5431 Sciatica, right side: Secondary | ICD-10-CM

## 2021-04-26 DIAGNOSIS — F322 Major depressive disorder, single episode, severe without psychotic features: Secondary | ICD-10-CM

## 2021-04-26 DIAGNOSIS — M5430 Sciatica, unspecified side: Secondary | ICD-10-CM | POA: Insufficient documentation

## 2021-04-26 DIAGNOSIS — D1724 Benign lipomatous neoplasm of skin and subcutaneous tissue of left leg: Secondary | ICD-10-CM

## 2021-04-26 DIAGNOSIS — R52 Pain, unspecified: Secondary | ICD-10-CM | POA: Insufficient documentation

## 2021-04-26 DIAGNOSIS — Z1211 Encounter for screening for malignant neoplasm of colon: Secondary | ICD-10-CM

## 2021-04-26 DIAGNOSIS — I10 Essential (primary) hypertension: Secondary | ICD-10-CM | POA: Diagnosis not present

## 2021-04-26 DIAGNOSIS — R519 Headache, unspecified: Secondary | ICD-10-CM

## 2021-04-26 DIAGNOSIS — M5416 Radiculopathy, lumbar region: Secondary | ICD-10-CM | POA: Insufficient documentation

## 2021-04-26 MED ORDER — NEBIVOLOL HCL 5 MG PO TABS: 5.0000 mg | ORAL_TABLET | Freq: Every day | ORAL | 3 refills | Status: DC

## 2021-04-26 NOTE — Assessment & Plan Note (Signed)
He will continue to see orthopedics for work-up of this.

## 2021-04-26 NOTE — Progress Notes (Signed)
[name], MD Phone: [phone]  Wesley Bridges is a 50 y.o. male who presents today for follow-up.    Depression: Patient reports this is better.  He cut back to Effexor 1 tablet once daily.  He was having issues seeing macaroni crawling on the ceiling while he was in bed.  He is unsure if he was awake or asleep when seeing this.  It has been recurrent.  This improved significantly with cutting the pill back to once daily.  He did not hear from psychiatry.  He notes no SI.  He notes he had lots of stressors previously though those things have improved.  Hypertension: Typically 135-138/80s.  Taking carvedilol and losartan.  No change to chronic issues with chest discomfort and shortness of breath for which he sees cardiology already.  No change to chronic swelling.  In the past he was on Bystolic and notes that worked well.  He knows this is covered by his insurance now.  Lipoma: Patient reports a large knot on his left hip.  Its been present for years.  It has been enlarging.  He notes occasional discomfort with that when he lays on it.  Right buttocks pain: Patient developed right buttocks pain radiating down his right leg.  He would have numbness with this.  He saw orthopedics today and had x-rays and was advised to get an MRI.  History of migraines: The patient was previously seeing neurology.  He was on Ajovy though has been off of this for 2 months.  He has been taking Topamax.  He has not had a migraine in 6 weeks.   Social History   Tobacco Use  Smoking Status Never  Smokeless Tobacco Never    Current Outpatient Medications on File Prior to Visit  Medication Sig Dispense Refill   aspirin EC 81 MG tablet Take 81 mg by mouth daily.     atorvastatin (LIPITOR) 80 MG tablet Take 1 tablet (80 mg total) by mouth daily. 90 tablet 3   fluticasone (FLONASE) 50 MCG/ACT nasal spray SPRAY 2 SPRAYS INTO EACH NOSTRIL EVERY DAY 48 mL 3   HYDROcodone-acetaminophen (NORCO) 5-325 MG  tablet Take 1-2 tablets by mouth every 6 (six) hours as needed for moderate pain or severe pain. MAXIMUM TOTAL ACETAMINOPHEN DOSE IS 4000 MG PER DAY 30 tablet 0   ibuprofen (ADVIL) 800 MG tablet Take 800 mg by mouth 3 (three) times daily as needed for moderate pain.      loratadine (CLARITIN) 10 MG tablet Take 1 tablet (10 mg total) by mouth daily. (Patient taking differently: Take 20 mg by mouth daily.) 30 tablet 11   losartan (COZAAR) 100 MG tablet TAKE 1 TABLET DAILY        (DISCONTINUE LISINOPRIL    PRESCRIPTION) 90 tablet 1   Multiple Vitamin (MULTIVITAMIN WITH MINERALS) TABS tablet Take 1 tablet by mouth at bedtime.     nitroGLYCERIN (NITROSTAT) 0.4 MG SL tablet Place 1 tablet (0.4 mg total) under the tongue every 5 (five) minutes as needed for chest pain. 25 tablet 0   pantoprazole (PROTONIX) 40 MG tablet TAKE 1 TABLET DAILY 90 tablet 0   topiramate (TOPAMAX) 50 MG tablet Take 100-125 mg by mouth See admin instructions. Take 100 mg by mouth in the morning and 125 mg in the evening     No current facility-administered medications on file prior to visit.     ROS see history of present illness  Objective  Physical Exam Vitals:   04/26/21  1342  BP: 140/80  Pulse: 66  Temp: 98.8 F (37.1 C)  SpO2: 99%    BP Readings from Last 3 Encounters:  04/26/21 140/80  11/02/20 121/70  05/01/20 (!) 140/96   Wt Readings from Last 3 Encounters:  04/26/21 208 lb 9.6 oz (94.6 kg)  11/02/20 222 lb 3.2 oz (100.8 kg)  05/01/20 210 lb (95.3 kg)    Physical Exam Constitutional:      General: He is not in acute distress.    Appearance: He is not diaphoretic.  Cardiovascular:     Rate and Rhythm: Normal rate and regular rhythm.     Heart sounds: Normal heart sounds.  Pulmonary:     Effort: Pulmonary effort is normal.     Breath sounds: Normal breath sounds.  Musculoskeletal:       Legs:  Skin:    General: Skin is warm and dry.  Neurological:     Mental Status: He is alert.      Assessment/Plan: Please see individual problem list.  Problem List Items Addressed This Visit     Essential hypertension - Primary    His blood pressure is above goal.  Discussed switching back to Bystolic as that seemed to be more beneficial previously.  I will send this in.  Once he is able to start on this he will discontinue the carvedilol.  He will continue the losartan 100 mg once daily.  Follow-up in 3 months.  He will let me know if his blood pressure does not trend down.      Relevant Medications   nebivolol (BYSTOLIC) 5 MG tablet   Other Relevant Orders   Comp Met (CMET)   Lipid panel   Headache disorder    Patient with migraine history.  Previously seen by neurology.  He has not had headaches in the last 6 weeks.  The Topamax seems to be beneficial.  He will continue on this.  Discussed at this point he does not need the Ajovy.      Relevant Medications   nebivolol (BYSTOLIC) 5 MG tablet   Lipoma of left lower extremity    Given the size and symptoms he is having we will refer to general surgery to consider removal.      Relevant Orders   Ambulatory referral to General Surgery   Major depression, single episode    This is much improved.  He wonders about coming off of Effexor completely and remaining off of medication at this time.  I certainly think it is reasonable to come off of the Effexor given the possible abnormal dreams or hallucinations that he has been having on it.  If those do not improve coming off of this he will let us know.  If he has any worsening of depression with coming off of this he will let us know so we can start on a new medication.  Follow-up in 3 months.      Sciatica    He will continue to see orthopedics for work-up of this.      Other Visit Diagnoses     Colon cancer screening       Relevant Orders   Ambulatory referral to Gastroenterology   Need for immunization against influenza       Relevant Orders   Flu Vaccine QUAD 37moIM  (Fluarix, Fluzone & Alfiuria Quad PF) (Completed)        Health Maintenance: The patient was encouraged to get his Shingrix vaccine at the pharmacy given that  he has Medicare.  We will refer to GI for colon cancer screening.  Return in about 3 months (around 07/27/2021).  This visit occurred during the SARS-CoV-2 public health emergency.  Safety protocols were in place, including screening questions prior to the visit, additional usage of staff PPE, and extensive cleaning of exam room while observing appropriate contact time as indicated for disinfecting solutions.   I have spent 51 minutes in the care of this patient regarding history taking, documentation, discussion of plan, completion of exam, placing orders.   [name], MD [place]

## 2021-04-26 NOTE — Assessment & Plan Note (Signed)
Patient with migraine history.  Previously seen by neurology.  He has not had headaches in the last 6 weeks.  The Topamax seems to be beneficial.  He will continue on this.  Discussed at this point he does not need the Ajovy.

## 2021-04-26 NOTE — Assessment & Plan Note (Signed)
This is much improved.  He wonders about coming off of Effexor completely and remaining off of medication at this time.  I certainly think it is reasonable to come off of the Effexor given the possible abnormal dreams or hallucinations that he has been having on it.  If those do not improve coming off of this he will let us know.  If he has any worsening of depression with coming off of this he will let us know so we can start on a new medication.  Follow-up in 3 months.

## 2021-04-26 NOTE — Patient Instructions (Signed)
Nice to see you. Please decrease the dose of the effexor to 50 mg once daily for 14 days and then discontinue this medication. If your depression worsens or you continue to see odd things at night please let us know. Once you get the bystolic you will stop the coreg.  GI and general surgery should call for appointments.

## 2021-04-26 NOTE — Assessment & Plan Note (Signed)
His blood pressure is above goal.  Discussed switching back to Bystolic as that seemed to be more beneficial previously.  I will send this in.  Once he is able to start on this he will discontinue the carvedilol.  He will continue the losartan 100 mg once daily.  Follow-up in 3 months.  He will let me know if his blood pressure does not trend down.

## 2021-04-26 NOTE — Assessment & Plan Note (Signed)
Given the size and symptoms he is having we will refer to general surgery to consider removal.

## 2021-04-27 LAB — COMPREHENSIVE METABOLIC PANEL
ALT: 23 U/L (ref 0–53)
AST: 19 U/L (ref 0–37)
Albumin: 4.3 g/dL (ref 3.5–5.2)
Alkaline Phosphatase: 80 U/L (ref 39–117)
BUN: 10 mg/dL (ref 6–23)
CO2: 26 mEq/L (ref 19–32)
Calcium: 9.4 mg/dL (ref 8.4–10.5)
Chloride: 108 mEq/L (ref 96–112)
Creatinine, Ser: 0.97 mg/dL (ref 0.40–1.50)
GFR: 90.26 mL/min (ref >60.00)
Glucose, Bld: 81 mg/dL (ref 70–99)
Potassium: 3.8 mEq/L (ref 3.5–5.1)
Sodium: 142 mEq/L (ref 135–145)
Total Bilirubin: 0.6 mg/dL (ref 0.2–1.2)
Total Protein: 6.9 g/dL (ref 6.0–8.3)

## 2021-04-27 LAB — LIPID PANEL
Cholesterol: 177 mg/dL (ref 0–200)
HDL: 48.2 mg/dL (ref >39.00)
LDL Cholesterol: 117 mg/dL — ABNORMAL HIGH (ref 0–99)
NonHDL: 128.53
Total CHOL/HDL Ratio: 4
Triglycerides: 57 mg/dL (ref 0.0–149.0)
VLDL: 11.4 mg/dL (ref 0.0–40.0)

## 2021-04-30 ENCOUNTER — Encounter: Payer: Self-pay | Admitting: Family Medicine

## 2021-05-02 MED ORDER — TOPIRAMATE 100 MG PO TABS: 100.0000 mg | ORAL_TABLET | Freq: Two times a day (BID) | ORAL | 1 refills | Status: DC

## 2021-05-12 DIAGNOSIS — M545 Low back pain, unspecified: Secondary | ICD-10-CM | POA: Insufficient documentation

## 2021-05-15 ENCOUNTER — Ambulatory Visit (INDEPENDENT_AMBULATORY_CARE_PROVIDER_SITE_OTHER): Payer: BC Managed Care – PPO | Admitting: Surgery

## 2021-05-15 ENCOUNTER — Encounter: Payer: Self-pay | Admitting: Surgery

## 2021-05-15 ENCOUNTER — Other Ambulatory Visit: Payer: Self-pay

## 2021-05-15 VITALS — BP 127/83 | HR 69 | Temp 98.7°F | Ht 67.0 in | Wt 209.6 lb

## 2021-05-15 DIAGNOSIS — D1724 Benign lipomatous neoplasm of skin and subcutaneous tissue of left leg: Secondary | ICD-10-CM

## 2021-05-15 DIAGNOSIS — R1031 Right lower quadrant pain: Secondary | ICD-10-CM

## 2021-05-15 NOTE — Patient Instructions (Addendum)
Your CT is scheduled for December 2nd, 2022 at [facility] [place] @ 9 am (arrive at 8:45 am), nothing to eat or drink 4 hours prior and pick up contrast at any [facility] radiology department between now and the day before CT.  If you have any concerns or questions, please feel free to call our office. See follow up appointment below.

## 2021-05-19 ENCOUNTER — Encounter: Payer: Self-pay | Admitting: Surgery

## 2021-05-19 NOTE — Progress Notes (Signed)
Outpatient Surgical Follow Up  05/19/2021  Wesley Bridges is an 50 y.o. male.   Chief Complaint  Patient presents with   New Patient (Initial Visit)    Lipoma left hip and right inguinal hernia    HPI: Wesley Bridges is a 50 year old male seen in consultation for lipoma of the left hip as well as right inguinal pain.  He did have a bilateral inguinal hernia repair robotically by me on October 2019.  He did well.  More recently over the last few months presents with intermittent pain in the right inguinal area seems to be worsening with strenuous activities.  No fevers or chills he does not feel a bulge.  The pain radiates to the scrotum and to the thigh.  In addition to this he does have an enlarging lipoma on the anterior proximal thigh on the left side.  He did have imaging studies a while back about 4 to 5 years ago showing the lipoma this was before the operation and that time he did have bilateral inguinal hernias.  Please note that I have personally reviewed the images  Past Medical History:  Diagnosis Date   Anginal pain ([place])    Arthritis    Asthma    Broken jaw ([place])    approx 25 yrs afo.  some limitations opening mouth   Chronic headaches    Complication of anesthesia    takes longer to wake up   Depression    Dyspnea    Dysrhythmia    Family history of adverse reaction to anesthesia    son takes longer to wake up than a normal person   GERD (gastroesophageal reflux disease)    Hyperlipidemia    Hypertension    Sleep apnea    cpap   Subclinical hyperthyroidism     Past Surgical History:  Procedure Laterality Date   ANKLE FRACTURE SURGERY     ANKLE FUSION     [facility]   CARDIAC CATHETERIZATION     no stent   ELBOW ARTHROSCOPY Right 09/01/2019   Procedure: RIGHT ELBOW ARTHROSCOPY WITH DEBRIDEMENT AND EXCISION OF LOOSE BODIES;  Surgeon: Corky Mull, MD;  Location: [facility];  Service: Orthopedics;  Laterality: Right;   FEMUR FRACTURE SURGERY     FRACTURE SURGERY      HERNIA REPAIR Bilateral    inguinal   I & D EXTREMITY Right 07/30/2018   Procedure: DEBRIDEMENT OF THE COMMON EXTENSOR ORIGIN OF ELBOW;  Surgeon: Corky Mull, MD;  Location: [place];  Service: Orthopedics;  Laterality: Right;  1.45 BIOMET JUGGERKNOT ANCHORS   KNEE ARTHROSCOPY WITH MEDIAL MENISECTOMY Left 05/13/2019   Procedure: KNEE ARTHROSCOPY WITH DEBRIDEMENT AND REPAIR CH;  Surgeon: Corky Mull, MD;  Location: [facility];  Service: Orthopedics;  Laterality: Left;   LEFT HEART CATH AND CORONARY ANGIOGRAPHY N/A 01/08/2017   Procedure: Left Heart Cath and Coronary Angiography;  Surgeon: Nelva Bush, MD;  Location: [place] [facility];  Service: Cardiovascular;  Laterality: N/A;   MANDIBLE FRACTURE SURGERY     ROBOT ASSISTED INGUINAL HERNIA REPAIR Bilateral 04/17/2018   Procedure: ROBOT ASSISTED INGUINAL HERNIA REPAIR;  Surgeon: Jules Husbands, MD;  Location: [facility];  Service: General;  Laterality: Bilateral;    Family History  Problem Relation Age of Onset   Arthritis Other        parents   Hypertension Mother    Thyroid disease Mother    Osteoporosis Mother    Syncope episode Mother  Hypertension Brother     Social History:  reports that he has never smoked. He has never used smokeless tobacco. He reports current alcohol use of about 14.0 standard drinks per week. He reports that he does not use drugs.  Allergies:  Allergies  Allergen Reactions   Pollen Extract    Bee Pollen Other (See Comments)    Cough runny eyes    Medications reviewed.    ROS Full ROS performed and is otherwise negative other than what is stated in HPI   BP 127/83   Pulse 69   Temp 98.7 F (37.1 C) (Oral)   Ht 5\' 7"  (1.702 m)   Wt 209 lb 9.6 oz (95.1 kg)   SpO2 98%   BMI 32.83 kg/m   Physical Exam Vitals and nursing note reviewed. Exam conducted with a chaperone present.  Constitutional:      Appearance: He is obese.  Cardiovascular:     Rate and Rhythm: Normal rate.      Pulses: Normal pulses.     Heart sounds: No murmur heard. Pulmonary:     Effort: Pulmonary effort is normal.     Breath sounds: No stridor.  Abdominal:     General: Abdomen is flat. There is no distension.     Palpations: Abdomen is soft. There is no mass.     Tenderness: There is abdominal tenderness. There is no guarding or rebound.     Hernia: No hernia is present.     Comments: Tenderness palpation on the right inguinal area.  I do not feel any hernia defects at this time on either inguinal spaces.  He had prior scars from robotic surgery.  Musculoskeletal:        General: Normal range of motion.  Skin:    General: Skin is warm and dry.     Capillary Refill: Capillary refill takes less than 2 seconds.     Coloration: Skin is not jaundiced.     Comments: There is evidence of subcutaneous soft tissue mass located on the anterolateral aspect of the left proximal thigh close to the inguinal region.  Clinically this looks like a large lipoma  Neurological:     General: No focal deficit present.     Mental Status: He is alert and oriented to person, place, and time.  Psychiatric:        Mood and Affect: Mood normal.        Behavior: Behavior normal.        Thought Content: Thought content normal.        Judgment: Judgment normal.     Assessment/Plan:  50 year old male with right inguinal pain following bilateral robotic inguinal hernia repair with mesh more than 3 years ago.  I do not feel any hernia defect on physical exam.  I would like to interrogate his inguinal anatomy with a CT scan of the abdomen and pelvis.  That will also give [place] an idea on the lipoma of the left thigh and compared with prior scans.  I will see him back once we have appropriate imaging studies.  Please note that I spent over 45 minutes in this encounter including personally reviewing imaging studies, coordination of his care, placing orders, counseling the patient and performing appropriate  documentation  Caroleen Hamman, MD [place] Surgeon

## 2021-05-21 ENCOUNTER — Other Ambulatory Visit: Payer: Self-pay

## 2021-05-26 ENCOUNTER — Other Ambulatory Visit: Payer: Self-pay

## 2021-05-26 ENCOUNTER — Ambulatory Visit
Admission: RE | Admit: 2021-05-26 | Discharge: 2021-05-26 | Disposition: A | Discharge status: Home / Self Care | Payer: BC Managed Care – PPO | Source: Ambulatory Visit | Attending: Surgery | Admitting: Surgery

## 2021-05-26 DIAGNOSIS — R1031 Right lower quadrant pain: Secondary | ICD-10-CM | POA: Diagnosis not present

## 2021-05-26 MED ORDER — IOHEXOL 350 MG/ML SOLN
100.0000 mL | Freq: Once | INTRAVENOUS | Status: AC | PRN
  Administered 2021-05-26: 100 mL via INTRAVENOUS

## 2021-05-30 ENCOUNTER — Other Ambulatory Visit: Payer: Self-pay | Admitting: Family Medicine

## 2021-06-07 ENCOUNTER — Ambulatory Visit: Payer: BC Managed Care – PPO | Admitting: Surgery

## 2021-06-07 ENCOUNTER — Ambulatory Visit (INDEPENDENT_AMBULATORY_CARE_PROVIDER_SITE_OTHER): Payer: BC Managed Care – PPO | Admitting: Surgery

## 2021-06-07 ENCOUNTER — Other Ambulatory Visit: Payer: Self-pay

## 2021-06-07 VITALS — BP 144/103 | HR 64 | Temp 98.6°F | Ht 66.0 in | Wt 209.0 lb

## 2021-06-07 DIAGNOSIS — D1724 Benign lipomatous neoplasm of skin and subcutaneous tissue of left leg: Secondary | ICD-10-CM

## 2021-06-07 DIAGNOSIS — R1031 Right lower quadrant pain: Secondary | ICD-10-CM

## 2021-06-07 NOTE — Patient Instructions (Signed)
We have you scheduled for an excision of lipoma from Left groin for next Wednesday. You need to hold your Aspirin for 5 days prior to this procedure.

## 2021-06-08 ENCOUNTER — Other Ambulatory Visit: Payer: Self-pay | Admitting: Family Medicine

## 2021-06-08 ENCOUNTER — Encounter: Payer: Self-pay | Admitting: Surgery

## 2021-06-08 NOTE — Progress Notes (Signed)
Outpatient Surgical Follow Up  06/08/2021  Wesley Bridges is an 50 y.o. male.   Chief Complaint  Patient presents with   Follow-up    Discuss CT results     HPI: Is a 50 year old male status post robotic inguinal hernia greater than 2 years ago with some mild pain in the right groin.  I did perform a CT scan that I have personally reviewed and there is lipomas of the cord on both sides but I do not see a true defect.  Discussed with the patient in detail.  He also has a lipoma within the CT scan and does not look malignant. Continues to have some intermittent pain related to the left thigh lipoma and is increasing in size.  Patient wishes to have this excised  Past Medical History:  Diagnosis Date   Anginal pain ([place])    Arthritis    Asthma    Broken jaw ([place])    approx 25 yrs afo.  some limitations opening mouth   Chronic headaches    Complication of anesthesia    takes longer to wake up   Depression    Dyspnea    Dysrhythmia    Family history of adverse reaction to anesthesia    son takes longer to wake up than a normal person   GERD (gastroesophageal reflux disease)    Hyperlipidemia    Hypertension    Sleep apnea    cpap   Subclinical hyperthyroidism     Past Surgical History:  Procedure Laterality Date   ANKLE FRACTURE SURGERY     ANKLE FUSION     [facility]   CARDIAC CATHETERIZATION     no stent   ELBOW ARTHROSCOPY Right 09/01/2019   Procedure: RIGHT ELBOW ARTHROSCOPY WITH DEBRIDEMENT AND EXCISION OF LOOSE BODIES;  Surgeon: Corky Mull, MD;  Location: [facility];  Service: Orthopedics;  Laterality: Right;   FEMUR FRACTURE SURGERY     FRACTURE SURGERY     HERNIA REPAIR Bilateral    inguinal   I & D EXTREMITY Right 07/30/2018   Procedure: DEBRIDEMENT OF THE COMMON EXTENSOR ORIGIN OF ELBOW;  Surgeon: Corky Mull, MD;  Location: [place];  Service: Orthopedics;  Laterality: Right;  1.45 BIOMET JUGGERKNOT ANCHORS   KNEE ARTHROSCOPY WITH MEDIAL  MENISECTOMY Left 05/13/2019   Procedure: KNEE ARTHROSCOPY WITH DEBRIDEMENT AND REPAIR CH;  Surgeon: Corky Mull, MD;  Location: [facility];  Service: Orthopedics;  Laterality: Left;   LEFT HEART CATH AND CORONARY ANGIOGRAPHY N/A 01/08/2017   Procedure: Left Heart Cath and Coronary Angiography;  Surgeon: Nelva Bush, MD;  Location: [place] [facility];  Service: Cardiovascular;  Laterality: N/A;   MANDIBLE FRACTURE SURGERY     ROBOT ASSISTED INGUINAL HERNIA REPAIR Bilateral 04/17/2018   Procedure: ROBOT ASSISTED INGUINAL HERNIA REPAIR;  Surgeon: Jules Husbands, MD;  Location: [facility];  Service: General;  Laterality: Bilateral;    Family History  Problem Relation Age of Onset   Arthritis Other        parents   Hypertension Mother    Thyroid disease Mother    Osteoporosis Mother    Syncope episode Mother    Hypertension Brother     Social History:  reports that he has never smoked. He has never used smokeless tobacco. He reports current alcohol use of about 14.0 standard drinks per week. He reports that he does not use drugs.  Allergies:  Allergies  Allergen Reactions   Pollen Extract  Bee Pollen Other (See Comments)    Cough runny eyes    Medications reviewed.    ROS Full ROS performed and is otherwise negative other than what is stated in HPI   BP (!) 144/103    Pulse 64    Temp 98.6 F (37 C)    Ht 5\' 6"  (1.676 m)    Wt 209 lb (94.8 kg)    SpO2 98%    BMI 33.73 kg/m   Physical Exam Vitals and nursing note reviewed. Exam conducted with a chaperone present.  Constitutional:      General: He is not in acute distress.    Appearance: Normal appearance. He is not ill-appearing or diaphoretic.  Eyes:     General: No scleral icterus.       Right eye: No discharge.        Left eye: No discharge.  Pulmonary:     Effort: Pulmonary effort is normal.     Breath sounds: No stridor.  Abdominal:     General: Abdomen is flat. There is no distension.     Palpations:  Abdomen is soft. There is no mass.     Tenderness: There is no abdominal tenderness. There is no guarding or rebound.     Hernia: No hernia is present.     Comments: Very minimal right inguinal discomfort.  Musculoskeletal:        General: No swelling or tenderness. Normal range of motion.  Skin:    General: Skin is warm and dry.     Capillary Refill: Capillary refill takes less than 2 seconds.     Comments: Evidence of an 8 x 10 cm left hip lipoma this is located anterior lateral thigh.  It is mobile.  Not Attached to the deep tissues  Neurological:     General: No focal deficit present.     Mental Status: He is alert and oriented to person, place, and time.  Psychiatric:        Mood and Affect: Mood normal.        Behavior: Behavior normal.        Thought Content: Thought content normal.        Judgment: Judgment normal.     Assessment/Plan: 50 year old male with mild chronic inguinal pain.  I personally reviewed his CT scan and I do think that he has some lipoma of the cord but no true recurrence.  On exam this confirms my suspicions.  Discussed with the patient in detail and at this time not interested in any pharmacotherapy or ilioinguinal nerve block.  He states that his symptoms are mild. Regarding his left hip/upper thigh lipoma it is symptomatic and enlarging and he wishes to have this excised.  I personally reviewed the CT scan and although the radiologist did not call a lipoma and there is clearly evidence of a subcutaneous lipoma without evidence of concerning areas for liposarcoma or other malignant lesions.  Discussed with the patient detail about the results.  He wishes to have this excised.  I do think that he would be a reasonable procedure to perform in the office since he is okay with it.  We will make timeframe for an excision under local here in the office in a week or 2. Is note that I spent over 40 minutes in this encounter including coordination of his care, personally  reviewing imaging studies, counseling the patient and performing appropriate documentation Patient to hold aspirin 5 days before procedure   [facility],  MD FACS General Surgeon

## 2021-06-14 ENCOUNTER — Encounter: Payer: Self-pay | Admitting: Surgery

## 2021-06-14 ENCOUNTER — Other Ambulatory Visit: Payer: Self-pay

## 2021-06-14 ENCOUNTER — Ambulatory Visit (INDEPENDENT_AMBULATORY_CARE_PROVIDER_SITE_OTHER): Payer: BC Managed Care – PPO | Admitting: Surgery

## 2021-06-14 ENCOUNTER — Other Ambulatory Visit: Payer: Self-pay | Admitting: Surgery

## 2021-06-14 VITALS — BP 167/89 | HR 57 | Temp 98.1°F | Ht 66.0 in | Wt 207.4 lb

## 2021-06-14 DIAGNOSIS — D1724 Benign lipomatous neoplasm of skin and subcutaneous tissue of left leg: Secondary | ICD-10-CM

## 2021-06-14 NOTE — Progress Notes (Signed)
Diagnosis: Symptomatic and expanding left thigh soft tissue mass  PROCEDURE: Excision of 12 cm soft tissue mass located left Hip ( UPPER THIGH) Complex wound closure in 3 layers of left lower extremity wound measuring 13 cm  ANESTHESIA: Lidocaine 1% with epinephrine total of 40 cc  Findings: Large 12 cm soft tissue mass left Hip consistent with lipoma  EBL: 3CC  After informed consent and the patient was explained about the risk benefit of procedure and possible complications patient was placed in the supine position.  He was prepped and draped in the usual sterile fashion.  Local anesthetic was injected. We performed a transverse incision using 15 blade knife.  Subcutaneous tissue was incised with knife sharply.  I then entered the capsule of the lipoma and dissection was performed circumferentially using Metzenbaum scissors.  This was a very large lipoma measuring 12 cm, it was above the fascia but within the deep subcutaneous tissue..  I needed to increase the size of the incision to be able to remove the specimen.  Adequate hemostasis was performed with pressure. I needed to release and perform some random cutaneous flap to be able to avoid any dog ears. The wound was then closed in a 3 layer fashion with a deep layer of interrupted 2-0 Vicryl then a dermal layer of multiple interrupted 3-0 Vicryl and the skin was closed with 4-0 Monocryl in a subcuticular fashion.  Dermabond was used to coat the skin.  Specimen was sent for permanent pathology.

## 2021-06-14 NOTE — Patient Instructions (Addendum)
Please see your follow up appointment listed below. Please keep the ice pack and apply pressure to the area over the next several days.   Today we have removed a Lipoma in our office. Please see information below regarding this type of tumor.  You are free to shower in 48 hours. This will be on 06/16/21.   You have glue on your skin and sutures under the skin. The glue will come off on it's own in 10-14 days. You may shower normally until this occurs but do not submerge.  Please use Tylenol or Ibuprofen for pain as needed.  We will see you back in 2 weeks to ensure that this has healed and to review the final pathology. Please see your appointment below. You may continue your regular activities right away but if you are having pain while doing something, stop what you are doing and try this activity once again in 3 days. Please call our office with any questions or concerns prior to your appointment.   Lipoma Removal Lipoma removal is a surgical procedure to remove a noncancerous (benign) tumor that is made up of fat cells (lipoma). Most lipomas are small and painless and do not require treatment. They can form in many areas of the body but are most common under the skin of the back, shoulders, arms, and thighs. You may need lipoma removal if you have a lipoma that is large, growing, or causing discomfort. Lipoma removal may also be done for cosmetic reasons. Tell a health care provider about: Any allergies you have. All medicines you are taking, including vitamins, herbs, eye drops, creams, and over-the-counter medicines. Any problems you or family members have had with anesthetic medicines. Any blood disorders you have. Any surgeries you have had. Any medical conditions you have. Whether you are pregnant or may be pregnant. What are the risks? Generally, this is a safe procedure. However, problems may occur, including: Infection. Bleeding. Allergic reactions to medicines. Damage to  nerves or blood vessels near the lipoma. Scarring.  What happens before the procedure? Staying hydrated Follow instructions from your health care provider about hydration, which may include: Up to 2 hours before the procedure - you may continue to drink clear liquids, such as water, clear fruit juice, black coffee, and plain tea.  Eating and drinking restrictions Follow instructions from your health care provider about eating and drinking, which may include: 8 hours before the procedure - stop eating heavy meals or foods such as meat, fried foods, or fatty foods. 6 hours before the procedure - stop eating light meals or foods, such as toast or cereal. 6 hours before the procedure - stop drinking milk or drinks that contain milk. 2 hours before the procedure - stop drinking clear liquids.  Medicines Ask your health care provider about: Changing or stopping your regular medicines. This is especially important if you are taking diabetes medicines or blood thinners. Taking medicines such as aspirin and ibuprofen. These medicines can thin your blood. Do not take these medicines before your procedure if your health care provider instructs you not to. You may be given antibiotic medicine to help prevent infection. General instructions Ask your health care provider how your surgical site will be marked or identified. You will have a physical exam. Your health care provider will check the size of the lipoma and whether it can be moved easily. You may have imaging tests, such as: X-rays. CT scan. MRI. Plan to have someone take you home from the  hospital or clinic. What happens during the procedure? To reduce your risk of infection: Your health care team will wash or sanitize their hands. Your skin will be washed with soap. You will be given one or more of the following: A medicine to help you relax (sedative). A medicine to numb the area (local anesthetic). A medicine to make you fall asleep  (general anesthetic). A medicine that is injected into an area of your body to numb everything below the injection site (regional anesthetic). An incision will be made over the lipoma or very near the lipoma. The incision may be made in a natural skin line or crease. Tissues, nerves, and blood vessels near the lipoma will be moved out of the way. The lipoma and the capsule that surrounds it will be separated from the surrounding tissues. The lipoma will be removed. The incision may be closed with stitches (sutures). A bandage (dressing) will be placed over the incision. What happens after the procedure? Do not drive for 24 hours if you received a sedative. Your blood pressure, heart rate, breathing rate, and blood oxygen level will be monitored until the medicines you were given have worn off. This information is not intended to replace advice given to you by your health care provider. Make sure you discuss any questions you have with your health care provider. Document Released: 08/25/2015 Document Revised: 11/17/2015 Document Reviewed: 08/25/2015 Elsevier Interactive Patient Education  [employer].

## 2021-06-16 ENCOUNTER — Telehealth: Payer: Self-pay

## 2021-06-16 NOTE — Telephone Encounter (Signed)
Notified patient as instructed, Per Dr Dahlia Byes pathology showed benign lipoma, patient pleased. Discussed follow-up appointments, patient agrees

## 2021-06-21 ENCOUNTER — Other Ambulatory Visit: Payer: Self-pay

## 2021-06-21 ENCOUNTER — Encounter: Payer: Self-pay | Admitting: Physician Assistant

## 2021-06-21 ENCOUNTER — Ambulatory Visit (INDEPENDENT_AMBULATORY_CARE_PROVIDER_SITE_OTHER): Payer: Medicare Other | Admitting: Physician Assistant

## 2021-06-21 VITALS — BP 131/87 | HR 57 | Temp 98.8°F | Ht 67.0 in | Wt 207.8 lb

## 2021-06-21 DIAGNOSIS — Z09 Encounter for follow-up examination after completed treatment for conditions other than malignant neoplasm: Secondary | ICD-10-CM

## 2021-06-21 DIAGNOSIS — D1724 Benign lipomatous neoplasm of skin and subcutaneous tissue of left leg: Secondary | ICD-10-CM

## 2021-06-21 NOTE — Patient Instructions (Signed)
Please call office with any questions or concerns.

## 2021-06-21 NOTE — Progress Notes (Signed)
[facility] POST-OP OFFICE VISIT  06/21/2021  HPI: Wesley Bridges is a 50 y.o. male 7 days s/p left upper thigh lipoma excision with Dr Dahlia Byes.   He is doing well He had some swelling the first day or some but this resolved No erythema or drainage from the wound No complaints of pain No other issues  Vital signs: BP 131/87    Pulse (!) 57    Temp 98.8 F (37.1 C) (Oral)    Ht 5\' 7"  (1.702 m)    Wt 207 lb 12.8 oz (94.3 kg)    SpO2 96%    BMI 32.55 kg/m    Physical Exam: Constitutional: Well appearing male, NAD Skin: Incision to the anterior left hip which is healing well, still with dermabond, no erythema or appreciable drainage, there is quite significant ecchymosis but no evidence of underlying hematoma  Assessment/Plan: This is a 50 y.o. male 7 days s/p left upper thigh lipoma excision   - Reviewed anticipated wound healing; ecchymosis may take a few weeks to resolved but again no evidence of hematoma or drainage  - Reviewed surgical pathology: lipoma   - He can follow up on as needed basis; he understands to call with questions/concerns   -- Edison Simon, PA-C [facility] 06/21/2021, 11:32 AM [phone] M-F: 7am - 4pm

## 2021-07-02 NOTE — Progress Notes (Deleted)
Gastroenterology Consultation  Referring Provider:     Leone Haven, MD Primary Care Physician:  Leone Haven, MD Primary Gastroenterologist:  Dr. Allen Norris     Reason for Consultation:     GERD        HPI:   Wesley Bridges is a 51 y.o. y/o male referred for consultation & management of GERD by Dr. Caryl Bis, Angela Adam, MD.  This patient comes in today after being seen in the past at [facility] clinic and then in October 2019 by Dr. Vicente Males. The patient was originally sent over for a screening colonoscopy but wanted to be seen for possible EGD because of heartburn.  The patient had reported to Dr. Vicente Males in 2019 that he had been having abdominal pain for years and he had an EGD and was told that he had a hiatal hernia.  It was reported that this was done back approximately 15 years ago at [facility].  His abdominal pain at that time was reported to be left upper quadrant and he denied any family history of colon cancer colon polyps. After the last visit he was recommended to have an EGD and colonoscopy and consideration for a CT scan of the abdomen was considered for the abdominal pain.  The patient was also told to start on dicyclomine 3 times a day.He was also recommended to have an H. Pylori breath test at that time.  Past Medical History:  Diagnosis Date   Anginal pain ([place])    Arthritis    Asthma    Broken jaw ([place])    approx 25 yrs afo.  some limitations opening mouth   Chronic headaches    Complication of anesthesia    takes longer to wake up   Depression    Dyspnea    Dysrhythmia    Family history of adverse reaction to anesthesia    son takes longer to wake up than a normal person   GERD (gastroesophageal reflux disease)    Hyperlipidemia    Hypertension    Sleep apnea    cpap   Subclinical hyperthyroidism     Past Surgical History:  Procedure Laterality Date   ANKLE FRACTURE SURGERY     ANKLE FUSION     [facility]   CARDIAC CATHETERIZATION     no stent   ELBOW  ARTHROSCOPY Right 09/01/2019   Procedure: RIGHT ELBOW ARTHROSCOPY WITH DEBRIDEMENT AND EXCISION OF LOOSE BODIES;  Surgeon: Corky Mull, MD;  Location: [facility];  Service: Orthopedics;  Laterality: Right;   FEMUR FRACTURE SURGERY     FRACTURE SURGERY     HERNIA REPAIR Bilateral    inguinal   I & D EXTREMITY Right 07/30/2018   Procedure: DEBRIDEMENT OF THE COMMON EXTENSOR ORIGIN OF ELBOW;  Surgeon: Corky Mull, MD;  Location: [place];  Service: Orthopedics;  Laterality: Right;  1.45 BIOMET JUGGERKNOT ANCHORS   KNEE ARTHROSCOPY WITH MEDIAL MENISECTOMY Left 05/13/2019   Procedure: KNEE ARTHROSCOPY WITH DEBRIDEMENT AND REPAIR CH;  Surgeon: Corky Mull, MD;  Location: [facility];  Service: Orthopedics;  Laterality: Left;   LEFT HEART CATH AND CORONARY ANGIOGRAPHY N/A 01/08/2017   Procedure: Left Heart Cath and Coronary Angiography;  Surgeon: Nelva Bush, MD;  Location: [place] [facility];  Service: Cardiovascular;  Laterality: N/A;   MANDIBLE FRACTURE SURGERY     ROBOT ASSISTED INGUINAL HERNIA REPAIR Bilateral 04/17/2018   Procedure: ROBOT ASSISTED INGUINAL HERNIA REPAIR;  Surgeon: Jules Husbands, MD;  Location:  [facility];  Service: General;  Laterality: Bilateral;    Prior to Admission medications   Medication Sig Start Date End Date Taking? Authorizing Provider  aspirin EC 81 MG tablet Take 81 mg by mouth daily.    [provider]  atorvastatin (LIPITOR) 80 MG tablet Take 1 tablet (80 mg total) by mouth daily. 11/25/20   Leone Haven, MD  fluticasone [facility]) 50 MCG/ACT nasal spray SPRAY 2 SPRAYS INTO EACH NOSTRIL EVERY DAY 02/17/21   Leone Haven, MD  ibuprofen (ADVIL) 800 MG tablet Take 800 mg by mouth 3 (three) times daily as needed for moderate pain.  01/23/19   [provider]  loratadine (CLARITIN) 10 MG tablet Take 1 tablet (10 mg total) by mouth daily. Patient taking differently: Take 20 mg by mouth daily. 04/18/16   Leone Haven, MD   losartan (COZAAR) 100 MG tablet TAKE 1 TABLET DAILY        (DISCONTINUE LISINOPRIL    PRESCRIPTION) 03/13/21   Leone Haven, MD  Multiple Vitamin (MULTIVITAMIN WITH MINERALS) TABS tablet Take 1 tablet by mouth at bedtime.    [provider]  nebivolol (BYSTOLIC) 5 MG tablet Take 1 tablet (5 mg total) by mouth daily. 04/26/21   Leone Haven, MD  nitroGLYCERIN (NITROSTAT) 0.4 MG SL tablet Place 1 tablet (0.4 mg total) under the tongue every 5 (five) minutes as needed for chest pain. 06/05/18   End, Harrell Gave, MD  pantoprazole (PROTONIX) 40 MG tablet TAKE 1 TABLET DAILY 06/08/21   Leone Haven, MD  topiramate (TOPAMAX) 100 MG tablet Take 1 tablet (100 mg total) by mouth 2 (two) times daily. 05/02/21   Leone Haven, MD    Family History  Problem Relation Age of Onset   Arthritis Other        parents   Hypertension Mother    Thyroid disease Mother    Osteoporosis Mother    Syncope episode Mother    Hypertension Brother      Social History   Tobacco Use   Smoking status: Never   Smokeless tobacco: Never  Vaping Use   Vaping Use: Never used  Substance Use Topics   Alcohol use: Yes    Alcohol/week: 14.0 standard drinks    Types: 14 Cans of beer per week    Comment: 2-3 a day    Drug use: No    Allergies as of 07/03/2021 - Review Complete 06/21/2021  Allergen Reaction Noted   Bee pollen Other (See Comments) and Cough 04/08/2018   Short ragweed pollen ext Other (See Comments) 06/14/2021   Pollen extract      Review of Systems:    All systems reviewed and negative except where noted in HPI.   Physical Exam:  There were no vitals taken for this visit. No LMP for male patient. General:   Alert,  Well-developed, well-nourished, pleasant and cooperative in NAD Head:  Normocephalic and atraumatic. Eyes:  Sclera clear, no icterus.   Conjunctiva pink. Ears:  Normal auditory acuity. Neck:  Supple; no masses or thyromegaly. Lungs:  Respirations even  and unlabored.  Clear throughout to auscultation.   No wheezes, crackles, or rhonchi. No acute distress. Heart:  Regular rate and rhythm; no murmurs, clicks, rubs, or gallops. Abdomen:  Normal bowel sounds.  No bruits.  Soft, non-tender and non-distended without masses, hepatosplenomegaly or hernias noted.  No guarding or rebound tenderness.  Negative Carnett sign.   Rectal:  Deferred.  Pulses:  Normal  pulses noted. Extremities:  No clubbing or edema.  No cyanosis. Neurologic:  Alert and oriented x3;  grossly normal neurologically. Skin:  Intact without significant lesions or rashes.  No jaundice. Lymph Nodes:  No significant cervical adenopathy. Psych:  Alert and cooperative. Normal mood and affect.  Imaging Studies: No results found.  Assessment and Plan:   LIZANDRO SPELLMAN is a 51 y.o. y/o male ***    Lucilla Lame, MD. Marval Regal    Note: This dictation was prepared with Dragon dictation along with smaller phrase technology. Any transcriptional errors that result from this process are unintentional.

## 2021-07-03 ENCOUNTER — Ambulatory Visit: Payer: Medicare Other | Admitting: Gastroenterology

## 2021-07-28 ENCOUNTER — Encounter: Payer: Self-pay | Admitting: Family Medicine

## 2021-07-28 DIAGNOSIS — I10 Essential (primary) hypertension: Secondary | ICD-10-CM

## 2021-07-28 MED ORDER — PANTOPRAZOLE SODIUM 40 MG PO TBEC
40.0000 mg | DELAYED_RELEASE_TABLET | Freq: Every day | ORAL | 1 refills | Status: DC
Start: 2021-07-28 — End: 2021-11-08

## 2021-07-28 MED ORDER — FLUTICASONE PROPIONATE 50 MCG/ACT NA SUSP: NASAL | 3 refills | Status: DC

## 2021-07-28 MED ORDER — LOSARTAN POTASSIUM 100 MG PO TABS: ORAL_TABLET | ORAL | 1 refills | Status: DC

## 2021-07-28 MED ORDER — TOPIRAMATE 100 MG PO TABS: 100.0000 mg | ORAL_TABLET | Freq: Two times a day (BID) | ORAL | 1 refills | Status: DC

## 2021-08-04 ENCOUNTER — Telehealth: Payer: Self-pay

## 2021-08-04 NOTE — Telephone Encounter (Signed)
I did a PA on the patients Topiramate and it was denied an alternative is Topiramate IR, if you don't want him to have that I can do an appeal by calling [phone].  Ahja Martello,cma

## 2021-08-07 NOTE — Telephone Encounter (Signed)
Faxed the form and confirmation given. Luismanuel Corman,cma  

## 2021-08-07 NOTE — Telephone Encounter (Signed)
There was a form requesting additional information. I have completed this. Please fax it back and we will see if this helps get the topiramate covered.

## 2021-08-08 ENCOUNTER — Other Ambulatory Visit: Payer: Self-pay

## 2021-08-08 DIAGNOSIS — E785 Hyperlipidemia, unspecified: Secondary | ICD-10-CM

## 2021-08-08 MED ORDER — ATORVASTATIN CALCIUM 80 MG PO TABS: 80.0000 mg | ORAL_TABLET | Freq: Every day | ORAL | 3 refills | Status: DC

## 2021-08-08 NOTE — Telephone Encounter (Signed)
I did a PA on the medication Topiramate 100 mg and it was approved from 07/08/2021-08/07/2022. Pharmacy was notified.   Elma Shands,cma

## 2021-08-09 ENCOUNTER — Ambulatory Visit (INDEPENDENT_AMBULATORY_CARE_PROVIDER_SITE_OTHER): Payer: Medicare Other | Admitting: Family Medicine

## 2021-08-09 ENCOUNTER — Encounter: Payer: Self-pay | Admitting: Family Medicine

## 2021-08-09 ENCOUNTER — Other Ambulatory Visit: Payer: Self-pay

## 2021-08-09 DIAGNOSIS — L989 Disorder of the skin and subcutaneous tissue, unspecified: Secondary | ICD-10-CM

## 2021-08-09 DIAGNOSIS — R519 Headache, unspecified: Secondary | ICD-10-CM | POA: Diagnosis not present

## 2021-08-09 DIAGNOSIS — I10 Essential (primary) hypertension: Secondary | ICD-10-CM

## 2021-08-09 DIAGNOSIS — G4733 Obstructive sleep apnea (adult) (pediatric): Secondary | ICD-10-CM

## 2021-08-09 DIAGNOSIS — R200 Anesthesia of skin: Secondary | ICD-10-CM

## 2021-08-09 DIAGNOSIS — D1724 Benign lipomatous neoplasm of skin and subcutaneous tissue of left leg: Secondary | ICD-10-CM | POA: Diagnosis not present

## 2021-08-09 DIAGNOSIS — M25521 Pain in right elbow: Secondary | ICD-10-CM

## 2021-08-09 MED ORDER — NEBIVOLOL HCL 5 MG PO TABS: 5.0000 mg | ORAL_TABLET | Freq: Every day | ORAL | 3 refills | Status: DC

## 2021-08-09 NOTE — Patient Instructions (Signed)
Nice to see you. Please keep your appointment for your colonoscopy. The sleep specialist should call you to schedule an appointment. If you need a refill on your Bystolic please let me know.

## 2021-08-09 NOTE — Assessment & Plan Note (Addendum)
Ongoing issues with this since he had his elbow surgery.  He will complete the nerve conduction study to help guide further treatment and evaluation.

## 2021-08-09 NOTE — Assessment & Plan Note (Signed)
He will continue to see orthopedics. 

## 2021-08-09 NOTE — Addendum Note (Signed)
Addended by: Fulton Mole D on: 08/09/2021 11:01 AM   Modules accepted: Orders

## 2021-08-09 NOTE — Assessment & Plan Note (Signed)
Refer to sleep specialist.  I encouraged the patient to consistently use his CPAP.  Discussed the risk of elevated blood pressure, stroke, heart attack, and death with untreated sleep apnea.

## 2021-08-09 NOTE — Assessment & Plan Note (Signed)
Status post removal.  He reports this is healed well.

## 2021-08-09 NOTE — Progress Notes (Signed)
Wesley Rumps, MD Phone: [phone]  Wesley Bridges is a 51 y.o. male who presents today for f/u.  HYPERTENSION Disease Monitoring Home BP Monitoring 130s/90s Chest pain- no change to chronic intermittent sharp chest discomfort    Dyspnea- notes with bending over Medications Compliance-  taking losartan, bystolic (has been out of bystolic for a couple of weeks)  Edema- no change to chronic edema BMET    Component Value Date/Time   NA 142 04/26/2021 1423   NA 139 12/18/2016 1224   K 3.8 04/26/2021 1423   CL 108 04/26/2021 1423   CO2 26 04/26/2021 1423   GLUCOSE 81 04/26/2021 1423   BUN 10 04/26/2021 1423   BUN 15 12/18/2016 1224   CREATININE 0.97 04/26/2021 1423   CALCIUM 9.4 04/26/2021 1423   GFRNONAA >60 04/01/2019 1157   GFRAA >60 04/01/2019 1157   Migraines: Patient notes these are okay.  Has not had a bad migraine in 3 months.  He does have occasional tension headaches.  He takes topiramate.  He does have an aura when he gets his migraines.  No change to chronic sensation of numbness and weakness.  No vision changes.  Right elbow pain: Patient had surgery for this in December.  He does not feel as though it helped.  His elbow is still locking and popping.  Since then he has noted his right forearm has been numb intermittently.  Notes the entire circumference of his forearm goes numb.  He does have chronic neck pain.  He notes orthopedics has set him up for nerve conduction study for this.  Lipoma: This was removed from his left hip.  He notes that has healed well.  He notes they had difficulty getting him numb enough at the start of the procedure.  OSA: He does not use his CPAP consistently.  Notes it dries out his mouth and makes it hard for him to swallow if he uses it for a few days in a row.  Skin lesion: This is on his left index finger between his PIP and DIP joint.  He notes he pinched the area with some pliers a couple months ago and the lesion has not  really improved since then.  He notes no enlargement or worsening of the lesion.  Patient reports he has an appointment with GI next month to discuss an endoscopy and colonoscopy.  Social History   Tobacco Use  Smoking Status Never  Smokeless Tobacco Never    Current Outpatient Medications on File Prior to Visit  Medication Sig Dispense Refill   aspirin EC 81 MG tablet Take 81 mg by mouth daily.     atorvastatin (LIPITOR) 80 MG tablet Take 1 tablet (80 mg total) by mouth daily. 90 tablet 3   fluticasone (FLONASE) 50 MCG/ACT nasal spray SPRAY 2 SPRAYS INTO EACH NOSTRIL EVERY DAY 48 mL 3   ibuprofen (ADVIL) 800 MG tablet Take 800 mg by mouth 3 (three) times daily as needed for moderate pain.      loratadine (CLARITIN) 10 MG tablet Take 1 tablet (10 mg total) by mouth daily. (Patient taking differently: Take 20 mg by mouth daily.) 30 tablet 11   losartan (COZAAR) 100 MG tablet TAKE 1 TABLET DAILY        (DISCONTINUE LISINOPRIL    PRESCRIPTION) 90 tablet 1   Multiple Vitamin (MULTIVITAMIN WITH MINERALS) TABS tablet Take 1 tablet by mouth at bedtime.     nebivolol (BYSTOLIC) 5 MG tablet Take 1 tablet (5 mg  total) by mouth daily. 90 tablet 3   nitroGLYCERIN (NITROSTAT) 0.4 MG SL tablet Place 1 tablet (0.4 mg total) under the tongue every 5 (five) minutes as needed for chest pain. 25 tablet 0   pantoprazole (PROTONIX) 40 MG tablet Take 1 tablet (40 mg total) by mouth daily. 90 tablet 1   topiramate (TOPAMAX) 100 MG tablet Take 1 tablet (100 mg total) by mouth 2 (two) times daily. 180 tablet 1   No current facility-administered medications on file prior to visit.     ROS see history of present illness  Objective  Physical Exam Vitals:   08/09/21 1008  BP: 121/80  Pulse: (!) 57  Temp: 98 F (36.7 C)  SpO2: 99%    BP Readings from Last 3 Encounters:  08/09/21 121/80  06/21/21 131/87  06/14/21 (!) 167/89   Wt Readings from Last 3 Encounters:  08/09/21 207 lb 9.6 oz (94.2 kg)   06/21/21 207 lb 12.8 oz (94.3 kg)  06/14/21 207 lb 6.4 oz (94.1 kg)    Physical Exam Constitutional:      General: He is not in acute distress.    Appearance: He is not diaphoretic.  Cardiovascular:     Rate and Rhythm: Normal rate and regular rhythm.     Heart sounds: Normal heart sounds.  Pulmonary:     Effort: Pulmonary effort is normal.     Breath sounds: Normal breath sounds.  Musculoskeletal:     Comments: Some tenderness over his right elbow  Skin:    General: Skin is warm and dry.     Comments: 2 to 3 mm brown lesion on the radial aspect of his left index finger between his PIP and DIP joint  Neurological:     Mental Status: He is alert.     Comments: 5/5 strength bilateral biceps, triceps, and grip, sensation light touch intact bilateral upper extremities     Assessment/Plan: Please see individual problem list.  Problem List Items Addressed This Visit     Essential hypertension    Above goal at home.  He needs to restart his Bystolic.  He will contact the pharmacy to see when he is going to receive it.  If he needs a refill sent and he will let us know.  He will continue losartan 100 mg once daily.      Headache disorder    Patient with a history of migraines.  This seems to be well controlled.  He will continue Topamax 100 mg twice daily.      Lipoma of left lower extremity    Status post removal.  He reports this is healed well.      OSA (obstructive sleep apnea)    Refer to sleep specialist.  I encouraged the patient to consistently use his CPAP.  Discussed the risk of elevated blood pressure, stroke, heart attack, and death with untreated sleep apnea.      Relevant Orders   Ambulatory referral to Pulmonology   Right elbow pain    He will continue to see orthopedics.      Right forearm numbness    Ongoing issues with this since he had his elbow surgery.  He will complete the nerve conduction study to help guide further treatment and evaluation.       Skin lesion    This appears to be related to a prior injury.  Discussed this should eventually go away.  If it enlarges or changes in any manner other than improving he  is to let me know.        Health Maintenance: He was encouraged to keep his appointment with GI.  Return in about 3 months (around 11/06/2021) for Hypertension.  This visit occurred during the SARS-CoV-2 public health emergency.  Safety protocols were in place, including screening questions prior to the visit, additional usage of staff PPE, and extensive cleaning of exam room while observing appropriate contact time as indicated for disinfecting solutions.    Wesley Rumps, MD [place]

## 2021-08-09 NOTE — Assessment & Plan Note (Signed)
Patient with a history of migraines.  This seems to be well controlled.  He will continue Topamax 100 mg twice daily.

## 2021-08-09 NOTE — Assessment & Plan Note (Signed)
Above goal at home.  He needs to restart his Bystolic.  He will contact the pharmacy to see when he is going to receive it.  If he needs a refill sent and he will let us know.  He will continue losartan 100 mg once daily.

## 2021-08-09 NOTE — Assessment & Plan Note (Signed)
This appears to be related to a prior injury.  Discussed this should eventually go away.  If it enlarges or changes in any manner other than improving he is to let me know.

## 2021-09-20 ENCOUNTER — Ambulatory Visit: Payer: Medicare Other | Admitting: Gastroenterology

## 2021-11-03 ENCOUNTER — Encounter: Payer: Self-pay | Admitting: Family Medicine

## 2021-11-03 DIAGNOSIS — M79641 Pain in right hand: Secondary | ICD-10-CM

## 2021-11-06 ENCOUNTER — Ambulatory Visit (INDEPENDENT_AMBULATORY_CARE_PROVIDER_SITE_OTHER): Payer: Medicare Other

## 2021-11-06 VITALS — Ht 67.0 in | Wt 207.0 lb

## 2021-11-06 DIAGNOSIS — Z0000 Encounter for general adult medical examination without abnormal findings: Secondary | ICD-10-CM | POA: Diagnosis not present

## 2021-11-06 NOTE — Patient Instructions (Addendum)
?  Wesley Bridges , ?Thank you for taking time to come for your Medicare Wellness Visit. I appreciate your ongoing commitment to your health goals. Please review the following plan we discussed and let me know if I can assist you in the future.  ? ?These are the goals we discussed: ? Goals   ? ?  I would like to lose a little weight   ?  Portion control meals ?  ? ?  ?  ?This is a list of the screening recommended for you and due dates:  ?Health Maintenance  ?Topic Date Due  ? COVID-19 Vaccine (4 - Booster) 11/22/2021*  ? Colon Cancer Screening  11/23/2021*  ? HIV Screening  12/23/2021*  ? Hepatitis C Screening: USPSTF Recommendation to screen - Ages 18-79 yo.  01/23/2022*  ? Zoster (Shingles) Vaccine (1 of 2) 02/06/2022*  ? Flu Shot  01/23/2022  ? Tetanus Vaccine  05/01/2030  ? HPV Vaccine  Aged Out  ?*Topic was postponed. The date shown is not the original due date.  ?  ?

## 2021-11-06 NOTE — Progress Notes (Signed)
Subjective:   Wesley Bridges is a 51 y.o. male who presents for an Initial Medicare Annual Wellness Visit.  Review of Systems    No ROS.  Medicare Wellness Virtual Visit.  Visual/audio telehealth visit, UTA vital signs.   See social history for additional risk factors.   Cardiac Risk Factors include: advanced age (>46men, >37 women);male gender     Objective:    Today's Vitals   11/06/21 1040  Weight: 207 lb (93.9 kg)  Height: 5\' 7"  (1.702 m)   Body mass index is 32.42 kg/m.     11/06/2021   10:54 AM 05/01/2020    2:18 PM 09/01/2019    6:25 AM 08/25/2019    3:58 PM 04/01/2019   11:44 AM 07/30/2018   12:04 PM 04/10/2018   10:55 AM  Advanced Directives  Does Patient Have a Medical Advance Directive? No No No No No No No  Would patient like information on creating a medical advance directive? No - Patient declined  No - Patient declined No - Patient declined  No - Patient declined No - Patient declined    Current Medications (verified) Outpatient Encounter Medications as of 11/06/2021  Medication Sig   aspirin EC 81 MG tablet Take 81 mg by mouth daily.   atorvastatin (LIPITOR) 80 MG tablet Take 1 tablet (80 mg total) by mouth daily.   fluticasone (FLONASE) 50 MCG/ACT nasal spray SPRAY 2 SPRAYS INTO EACH NOSTRIL EVERY DAY   ibuprofen (ADVIL) 800 MG tablet Take 800 mg by mouth 3 (three) times daily as needed for moderate pain.    loratadine (CLARITIN) 10 MG tablet Take 1 tablet (10 mg total) by mouth daily. (Patient taking differently: Take 20 mg by mouth daily.)   losartan (COZAAR) 100 MG tablet TAKE 1 TABLET DAILY        (DISCONTINUE LISINOPRIL    PRESCRIPTION)   Multiple Vitamin (MULTIVITAMIN WITH MINERALS) TABS tablet Take 1 tablet by mouth at bedtime.   nebivolol (BYSTOLIC) 5 MG tablet Take 1 tablet (5 mg total) by mouth daily.   nitroGLYCERIN (NITROSTAT) 0.4 MG SL tablet Place 1 tablet (0.4 mg total) under the tongue every 5 (five) minutes as needed for chest pain.    pantoprazole (PROTONIX) 40 MG tablet Take 1 tablet (40 mg total) by mouth daily.   topiramate (TOPAMAX) 100 MG tablet Take 1 tablet (100 mg total) by mouth 2 (two) times daily.   No facility-administered encounter medications on file as of 11/06/2021.    Allergies (verified) Bee pollen, Short ragweed pollen ext, and Pollen extract   History: Past Medical History:  Diagnosis Date   Anginal pain (HCC)    Arthritis    Asthma    Broken jaw (HCC)    approx 25 yrs afo.  some limitations opening mouth   Chronic headaches    Complication of anesthesia    takes longer to wake up   Depression    Dyspnea    Dysrhythmia    Family history of adverse reaction to anesthesia    son takes longer to wake up than a normal person   GERD (gastroesophageal reflux disease)    Hyperlipidemia    Hypertension    Sleep apnea    cpap   Subclinical hyperthyroidism    Past Surgical History:  Procedure Laterality Date   ANKLE FRACTURE SURGERY     ANKLE FUSION     [facility]   CARDIAC CATHETERIZATION     no stent   ELBOW ARTHROSCOPY Right  09/01/2019   Procedure: RIGHT ELBOW ARTHROSCOPY WITH DEBRIDEMENT AND EXCISION OF LOOSE BODIES;  Surgeon: Christena Flake, MD;  Location: [facility] ORS;  Service: Orthopedics;  Laterality: Right;   FEMUR FRACTURE SURGERY     FRACTURE SURGERY     HERNIA REPAIR Bilateral    inguinal   I & D EXTREMITY Right 07/30/2018   Procedure: DEBRIDEMENT OF THE COMMON EXTENSOR ORIGIN OF ELBOW;  Surgeon: Christena Flake, MD;  Location: [facility];  Service: Orthopedics;  Laterality: Right;  1.45 BIOMET JUGGERKNOT ANCHORS   KNEE ARTHROSCOPY WITH MEDIAL MENISECTOMY Left 05/13/2019   Procedure: KNEE ARTHROSCOPY WITH DEBRIDEMENT AND REPAIR CH;  Surgeon: Christena Flake, MD;  Location: [facility] OR[facility];  Service: Orthopedics;  Laterality: Left;   LEFT HEART CATH AND CORONARY ANGIOGRAPHY N/A 01/08/2017   Procedure: Left Heart Cath and Coronary Angiography;  Surgeon: Yvonne Kendall, MD;  Location: [facility]  INVASIVE CV LAB;  Service: Cardiovascular;  Laterality: N/A;   MANDIBLE FRACTURE SURGERY     ROBOT ASSISTED INGUINAL HERNIA REPAIR Bilateral 04/17/2018   Procedure: ROBOT ASSISTED INGUINAL HERNIA REPAIR;  Surgeon: Leafy Ro, MD;  Location: [facility] OR[facility];  Service: General;  Laterality: Bilateral;   Family History  Problem Relation Age of Onset   Arthritis Other        parents   Hypertension Mother    Thyroid disease Mother    Osteoporosis Mother    Syncope episode Mother    Hypertension Brother    Social History   Socioeconomic History   Marital status: Married    Spouse name: Not on file   Number of children: 6   Years of education: Not on file   Highest education level: High school graduate  Occupational History   Not on file  Tobacco Use   Smoking status: Never   Smokeless tobacco: Never  Vaping Use   Vaping Use: Never used  Substance and Sexual Activity   Alcohol use: Yes    Alcohol/week: 14.0 standard drinks    Types: 14 Cans of beer per week    Comment: 2-3 a day    Drug use: No   Sexual activity: Not on file  Other Topics Concern   Not on file  Social History Narrative   Lives at home with family. Independent at baseline.   Social Determinants of Health   Financial Resource Strain: Low Risk    Difficulty of Paying Living Expenses: Not hard at all  Food Insecurity: No Food Insecurity   Worried About Programme researcher, broadcasting/film/video in the Last Year: Never true   Ran Out of Food in the Last Year: Never true  Transportation Needs: No Transportation Needs   Lack of Transportation (Medical): No   Lack of Transportation (Non-Medical): No  Physical Activity: Insufficiently Active   Days of Exercise per Week: 1 day   Minutes of Exercise per Session: 60 min  Stress: No Stress Concern Present   Feeling of Stress : Not at all  Social Connections: Unknown   Frequency of Communication with Friends and Family: Not on file   Frequency of Social Gatherings with Friends and  Family: Not on file   Attends Religious Services: Not on file   Active Member of Clubs or Organizations: Not on file   Attends Banker Meetings: Not on file   Marital Status: Married    Tobacco Counseling Counseling given: Not Answered   Clinical Intake:  Pre-visit preparation completed: Yes  Diabetes: No  How often do you need to have someone help you when you read instructions, pamphlets, or other written materials from your doctor or pharmacy?: 1 - Never   Interpreter Needed?: No    Activities of Daily Living    11/06/2021   10:56 AM  In your present state of health, do you have any difficulty performing the following activities:  Hearing? 0  Vision? 0  Difficulty concentrating or making decisions? 0  Walking or climbing stairs? 0  Dressing or bathing? 0  Doing errands, shopping? 0  Preparing Food and eating ? N  Using the Toilet? N  In the past six months, have you accidently leaked urine? N  Do you have problems with loss of bowel control? N  Managing your Medications? N  Managing your Finances? N  Housekeeping or managing your Housekeeping? N  Comment Paces self    Patient Care Team: Glori Luis, MD as PCP - General (Family Medicine) End, Cristal Deer, MD as PCP - Cardiology (Cardiology) Lonell Face, MD as Consulting Physician (Neurology)  Indicate any recent Medical Services you may have received from other than [facility] providers in the past year (date may be approximate).     Assessment:   This is a routine wellness examination for [facility].  Virtual Visit via Telephone Note  I connected with  Wesley Bridges on 11/06/21 at 10:30 AM EDT by telephone and verified that I am speaking with the correct person using two identifiers.  Persons participating in the virtual visit: patient/Nurse Health Advisor   I discussed the limitations of performing an evaluation and management service by telehealth. We continued and  completed visit with audio only.Some vital signs may be absent or patient reported.   Hearing/Vision screen Hearing Screening - Comments:: Patient is able to hear conversational tones without difficulty.  No issues reported. Vision Screening - Comments:: Followed by [facility]  Dietary issues and exercise activities discussed: Current Exercise Habits: Home exercise routine, Time (Minutes): 60, Frequency (Times/Week): 1, Weekly Exercise (Minutes/Week): 60, Intensity: Mild Healthy diet   Goals Addressed             This Visit's Progress    I would like to lose a little weight       Portion control meals       Depression Screen    11/06/2021   10:55 AM 08/09/2021   10:09 AM 04/26/2021    1:43 PM 10/07/2019   10:33 AM 10/07/2019    9:52 AM 04/07/2019    3:24 PM 07/22/2018   10:24 AM  PHQ 2/9 Scores  PHQ - 2 Score 0 0 0 5 0 0 3  PHQ- 9 Score    21   20    Fall Risk    11/06/2021   10:55 AM 08/09/2021   10:09 AM 06/21/2021   11:20 AM 06/14/2021   11:11 AM 04/26/2021    1:43 PM  Fall Risk   Falls in the past year? 0 0 0 0 0  Number falls in past yr: 0 0   0  Injury with Fall?  0   0  Risk for fall due to :  No Fall Risks   No Fall Risks  Follow up Falls evaluation completed Falls evaluation completed   Falls evaluation completed   FALL RISK PREVENTION PERTAINING TO THE HOME: Home free of loose throw rugs in walkways, pet beds, electrical cords, etc? Yes  Adequate lighting in your home to reduce  risk of falls? Yes   ASSISTIVE DEVICES UTILIZED TO PREVENT FALLS: Use of a cane, walker or w/c? Yes   TIMED UP AND GO: Was the test performed? No .   Cognitive Function:  Regular diet      11/06/2021   10:57 AM  6CIT Screen  What Year? 0 points  What month? 0 points  What time? 0 points  Months in reverse 0 points   Immunizations Immunization History  Administered Date(s) Administered   Influenza, Seasonal, Injecte, Preservative Fre 04/09/2012    Influenza,inj,Quad PF,6+ Mos 04/08/2013, 04/11/2015, 03/22/2016, 03/26/2017, 04/02/2018, 04/07/2019, 04/26/2021   Influenza-Unspecified 04/02/2018, 04/07/2019   Moderna Sars-Covid-2 Vaccination 08/28/2019, 09/25/2019   PFIZER Comirnaty(Gray Top)Covid-19 Tri-Sucrose Vaccine 04/28/2020   Tdap 04/02/2018, 05/01/2020   Shingrix Completed?: No.    Education has been provided regarding the importance of this vaccine. Patient has been advised to call insurance company to determine out of pocket expense if they have not yet received this vaccine. Advised may also receive vaccine at local pharmacy or Health Dept. Verbalized acceptance and understanding.  Screening Tests Health Maintenance  Topic Date Due   COVID-19 Vaccine (4 - Booster) 11/22/2021 (Originally 06/23/2020)   COLONOSCOPY (Pts 45-6yrs Insurance coverage will need to be confirmed)  11/23/2021 (Originally 08/12/2014)   HIV Screening  12/23/2021 (Originally 08/12/1984)   Hepatitis C Screening  01/23/2022 (Originally 08/13/1987)   Zoster Vaccines- Shingrix (1 of 2) 02/06/2022 (Originally 08/12/1988)   INFLUENZA VACCINE  01/23/2022   TETANUS/TDAP  05/01/2030   HPV VACCINES  Aged Out   Health Maintenance There are no preventive care reminders to display for this patient.  Colonoscopy- scheduled June 2023.   Lung Cancer Screening: (Low Dose CT Chest recommended if Age 83-80 years, 30 pack-year currently smoking OR have quit w/in 15years.) does not qualify.   Labs followed by PCP. HIV/Hep C screening deferred.   Vision Screening: Recommended annual ophthalmology exams for early detection of glaucoma and other disorders of the eye.  Dental Screening: Recommended annual dental exams for proper oral hygiene  Community Resource Referral / Chronic Care Management: CRR required this visit?  No   CCM required this visit?  No      Plan:   Keep all routine maintenance appointments.   I have personally reviewed and noted the following in  the patient's chart:   Medical and social history Use of alcohol, tobacco or illicit drugs  Current medications and supplements including opioid prescriptions. Patient is not currently taking opioid prescriptions. Functional ability and status Nutritional status Physical activity Advanced directives List of other physicians Hospitalizations, surgeries, and ER visits in previous 12 months Vitals Screenings to include cognitive, depression, and falls Referrals and appointments  In addition, I have reviewed and discussed with patient certain preventive protocols, quality metrics, and best practice recommendations. A written personalized care plan for preventive services as well as general preventive health recommendations were provided to patient.     Ashok Pall, LPN   10/01/8117

## 2021-11-08 ENCOUNTER — Ambulatory Visit (INDEPENDENT_AMBULATORY_CARE_PROVIDER_SITE_OTHER): Payer: Medicare Other | Admitting: Family Medicine

## 2021-11-08 ENCOUNTER — Encounter: Payer: Self-pay | Admitting: Family Medicine

## 2021-11-08 VITALS — BP 118/80 | HR 62 | Temp 98.9°F | Ht 67.0 in | Wt 207.0 lb

## 2021-11-08 DIAGNOSIS — I10 Essential (primary) hypertension: Secondary | ICD-10-CM | POA: Diagnosis not present

## 2021-11-08 DIAGNOSIS — M79642 Pain in left hand: Secondary | ICD-10-CM

## 2021-11-08 DIAGNOSIS — L609 Nail disorder, unspecified: Secondary | ICD-10-CM

## 2021-11-08 DIAGNOSIS — M79641 Pain in right hand: Secondary | ICD-10-CM

## 2021-11-08 DIAGNOSIS — R7303 Prediabetes: Secondary | ICD-10-CM

## 2021-11-08 DIAGNOSIS — F322 Major depressive disorder, single episode, severe without psychotic features: Secondary | ICD-10-CM | POA: Diagnosis not present

## 2021-11-08 DIAGNOSIS — K219 Gastro-esophageal reflux disease without esophagitis: Secondary | ICD-10-CM | POA: Diagnosis not present

## 2021-11-08 DIAGNOSIS — M79643 Pain in unspecified hand: Secondary | ICD-10-CM | POA: Insufficient documentation

## 2021-11-08 LAB — HEMOGLOBIN A1C: Hgb A1c MFr Bld: 5.7 % (ref 4.6–6.5)

## 2021-11-08 LAB — BASIC METABOLIC PANEL
BUN: 24 mg/dL — ABNORMAL HIGH (ref 6–23)
CO2: 22 mEq/L (ref 19–32)
Calcium: 9.3 mg/dL (ref 8.4–10.5)
Chloride: 110 mEq/L (ref 96–112)
Creatinine, Ser: 1.27 mg/dL (ref 0.40–1.50)
GFR: 65.08 mL/min (ref >60.00)
Glucose, Bld: 96 mg/dL (ref 70–99)
Potassium: 4.3 mEq/L (ref 3.5–5.1)
Sodium: 140 mEq/L (ref 135–145)

## 2021-11-08 MED ORDER — PANTOPRAZOLE SODIUM 40 MG PO TBEC: 40.0000 mg | DELAYED_RELEASE_TABLET | Freq: Two times a day (BID) | ORAL | 1 refills | Status: DC

## 2021-11-08 NOTE — Assessment & Plan Note (Signed)
Asymptomatic. He will monitor. ?

## 2021-11-08 NOTE — Assessment & Plan Note (Signed)
Check A1c. 

## 2021-11-08 NOTE — Progress Notes (Signed)
?[name], MD ?Phone: [phone] ? ?Wesley Bridges is a 51 y.o. male who presents today for f/u. ? ?HYPERTENSION ?Disease Monitoring ?Home BP Monitoring similar Chest pain- no    Dyspnea- no  ?Medications ?Compliance-  taking losartan, bystolic  Edema- no ?BMET ?   ?Component Value Date/Time  ? NA 142 04/26/2021 1423  ? NA 139 12/18/2016 1224  ? K 3.8 04/26/2021 1423  ? CL 108 04/26/2021 1423  ? CO2 26 04/26/2021 1423  ? GLUCOSE 81 04/26/2021 1423  ? BUN 10 04/26/2021 1423  ? BUN 15 12/18/2016 1224  ? CREATININE 0.97 04/26/2021 1423  ? CALCIUM 9.4 04/26/2021 1423  ? GFRNONAA >60 04/01/2019 1157  ? GFRAA >60 04/01/2019 1157  ? ?GERD:   ?Reflux symptoms: acid reflux symptoms in to his throat   ?Abd pain: no epigastric pain, chronic left sided pain   ?Blood in stool: no  ?Dysphagia: no   ?EGD: notes last was in 2005, states he has one scheduled in the near future  ?Medication: taking protonix 1-2x/day, takes the morning dose 30 minutes before breakfast, the evening dose is right before bed ?He does report a family history of gastric cancer in his maternal grandfather.  ? ?Bilateral hand pain: He has seen orthopedics and states they advised he had mild arthritis and mild carpal tunnel syndrome.  There discussed surgery that the patient was unsure about this given that the findings have been described as mild.  He has been referred to physical medicine rehab for further evaluation. ? ?Brittle fingernails: This has been going on a while.  His fingernails crack. ? ?Depression: He notes no depression.  He is no longer on medication. Notes the hallucinations resolved with stopping the effexor.  ? ?Social History  ? ?Tobacco Use  ?Smoking Status Never  ?Smokeless Tobacco Never  ? ? ?Current Outpatient Medications on File Prior to Visit  ?Medication Sig Dispense Refill  ? aspirin EC 81 MG tablet Take 81 mg by mouth daily.    ? atorvastatin (LIPITOR) 80 MG tablet Take 1 tablet (80 mg total) by mouth daily. 90  tablet 3  ? fluticasone (FLONASE) 50 MCG/ACT nasal spray SPRAY 2 SPRAYS INTO EACH NOSTRIL EVERY DAY 48 mL 3  ? gabapentin (NEURONTIN) 300 MG capsule Take by mouth.    ? ibuprofen (ADVIL) 800 MG tablet Take 800 mg by mouth 3 (three) times daily as needed for moderate pain.     ? loratadine (CLARITIN) 10 MG tablet Take 1 tablet (10 mg total) by mouth daily. (Patient taking differently: Take 20 mg by mouth daily.) 30 tablet 11  ? losartan (COZAAR) 100 MG tablet TAKE 1 TABLET DAILY        (DISCONTINUE LISINOPRIL    PRESCRIPTION) 90 tablet 1  ? meloxicam (MOBIC) 15 MG tablet Take 15 mg by mouth daily.    ? Multiple Vitamin (MULTIVITAMIN WITH MINERALS) TABS tablet Take 1 tablet by mouth at bedtime.    ? nebivolol (BYSTOLIC) 5 MG tablet Take 1 tablet (5 mg total) by mouth daily. 90 tablet 3  ? nitroGLYCERIN (NITROSTAT) 0.4 MG SL tablet Place 1 tablet (0.4 mg total) under the tongue every 5 (five) minutes as needed for chest pain. 25 tablet 0  ? topiramate (TOPAMAX) 100 MG tablet Take 1 tablet (100 mg total) by mouth 2 (two) times daily. 180 tablet 1  ? ?No current facility-administered medications on file prior to visit.  ? ? ? ?ROS see history of present illness ? ?Objective ? ?  Physical Exam ?Vitals:  ? 11/08/21 0849  ?BP: 118/80  ?Pulse: 62  ?Temp: 98.9 ?F (37.2 ?C)  ?SpO2: 98%  ? ? ?BP Readings from Last 3 Encounters:  ?11/08/21 118/80  ?08/09/21 121/80  ?06/21/21 131/87  ? ?Wt Readings from Last 3 Encounters:  ?11/08/21 207 lb (93.9 kg)  ?11/06/21 207 lb (93.9 kg)  ?08/09/21 207 lb 9.6 oz (94.2 kg)  ? ? ?Physical Exam ?Constitutional:   ?   General: He is not in acute distress. ?   Appearance: He is not diaphoretic.  ?Cardiovascular:  ?   Rate and Rhythm: Normal rate and regular rhythm.  ?   Heart sounds: Normal heart sounds.  ?Pulmonary:  ?   Effort: Pulmonary effort is normal.  ?   Breath sounds: Normal breath sounds.  ?Abdominal:  ?   General: Bowel sounds are normal. There is no distension.  ?   Palpations: Abdomen  is soft.  ?   Tenderness: There is no abdominal tenderness.  ?Musculoskeletal:  ?   Comments: Thin appearing fingernails  ?Skin: ?   General: Skin is warm and dry.  ?Neurological:  ?   Mental Status: He is alert.  ? ? ? ?Assessment/Plan: Please see individual problem list. ? ?Problem List Items Addressed This Visit   ? ? Esophageal reflux (Chronic)  ?  Uncontrolled. He will increase his protonix to 40 mg BID 30 minutes before breakfast and dinner. He will proceed with his EGD as scheduled. After one month he will try to reduce his dose back to protonix 40 mg daily before breakfast.  ? ?  ?  ? Relevant Medications  ? pantoprazole (PROTONIX) 40 MG tablet  ? Essential hypertension - Primary (Chronic)  ?  Adequate control.  He will continue Bystolic 5 mg daily and losartan 100 mg daily. Check labs.  ? ?  ?  ? Relevant Orders  ? Basic Metabolic Panel (BMET)  ? Prediabetes (Chronic)  ?  Check A1c. ? ?  ?  ? Relevant Orders  ? HgB A1c  ? Fingernail abnormalities  ?  Check labs to evaluate for underlying deficiency. He can also try OTC biotin. He is already on a multivitamin.  ? ?  ?  ? Hand pain  ?  He has been referred to PM&R for evaluation at the request of his orthopedist.  ? ?  ?  ? Major depression, single episode  ?  Asymptomatic. He will monitor. ? ?  ?  ? ? ? ?Return in about 3 months (around 02/08/2022) for GERD. ? ? ?[name], MD ?[place] ? ?

## 2021-11-08 NOTE — Assessment & Plan Note (Signed)
Adequate control.  He will continue Bystolic 5 mg daily and losartan 100 mg daily. Check labs.  ?

## 2021-11-08 NOTE — Assessment & Plan Note (Signed)
Check labs to evaluate for underlying deficiency. He can also try OTC biotin. He is already on a multivitamin.  ?

## 2021-11-08 NOTE — Patient Instructions (Signed)
Nice to see you.  ?We will get labs today and contact you with the results.  ?Please try the protonix 40 mg twice daily 30 minutes prior to breakfast and dinner for 30 days. After 30 days please try to reduce back to once daily. Please have the EGD as planned. ?You can try Biotin to see if that helps with your fingernails. ?

## 2021-11-08 NOTE — Assessment & Plan Note (Signed)
He has been referred to PM&R for evaluation at the request of his orthopedist.  ?

## 2021-11-08 NOTE — Assessment & Plan Note (Signed)
Uncontrolled. He will increase his protonix to 40 mg BID 30 minutes before breakfast and dinner. He will proceed with his EGD as scheduled. After one month he will try to reduce his dose back to protonix 40 mg daily before breakfast.  ?

## 2021-11-16 ENCOUNTER — Other Ambulatory Visit: Payer: Self-pay | Admitting: Family Medicine

## 2021-11-16 DIAGNOSIS — M5412 Radiculopathy, cervical region: Secondary | ICD-10-CM

## 2021-11-29 ENCOUNTER — Ambulatory Visit
Admission: RE | Admit: 2021-11-29 | Discharge: 2021-11-29 | Disposition: A | Discharge status: Home / Self Care | Payer: Medicare Other | Source: Ambulatory Visit | Attending: Family Medicine | Admitting: Family Medicine

## 2021-11-29 DIAGNOSIS — M5412 Radiculopathy, cervical region: Secondary | ICD-10-CM

## 2021-12-04 ENCOUNTER — Ambulatory Visit (INDEPENDENT_AMBULATORY_CARE_PROVIDER_SITE_OTHER): Payer: Medicare Other | Admitting: Gastroenterology

## 2021-12-04 ENCOUNTER — Encounter: Payer: Self-pay | Admitting: Gastroenterology

## 2021-12-04 ENCOUNTER — Other Ambulatory Visit: Payer: Self-pay

## 2021-12-04 VITALS — BP 113/72 | HR 56 | Ht 67.0 in | Wt 208.2 lb

## 2021-12-04 DIAGNOSIS — K219 Gastro-esophageal reflux disease without esophagitis: Secondary | ICD-10-CM

## 2021-12-04 MED ORDER — CLENPIQ 10-3.5-12 MG-GM -GM/160ML PO SOLN: 320.0000 mL | ORAL | 0 refills | Status: DC

## 2021-12-04 NOTE — Progress Notes (Signed)
Primary Care Physician: Leone Haven, MD  Primary Gastroenterologist:  Dr. Lucilla Lame  Chief Complaint  Patient presents with   Gastroesophageal Reflux    HPI: Wesley Bridges is a 51 y.o. male here with a history of being seen by [facility] clinic GI up until 2017 and then was seen by Dr. Vicente Bridges.  The patient then subsequently saw Dr. Bonna Bridges with his last visit with her was back in 2021.  The patient has a history of reflux.  At the last office visit the patient was recommended to undergo a upper endoscopy to rule out Barrett's due to his GERD and some intermittent dysphagia.  The patient was also recommended to undergo a colonoscopy for screening purposes. The patient reports that he has increase his pantoprazole to twice a day due to acid breakthrough.  The patient also reports that he has had chronic heartburn.  The patient denies any unexplained weight loss black stools or bloody stools.  He also denies any nausea or vomiting. There is no report of dysphagia.   Past Medical History:  Diagnosis Date   Anginal pain ([place])    Arthritis    Asthma    Broken jaw ([place])    approx 25 yrs afo.  some limitations opening mouth   Chronic headaches    Complication of anesthesia    takes longer to wake up   Depression    Dyspnea    Dysrhythmia    Family history of adverse reaction to anesthesia    son takes longer to wake up than a normal person   GERD (gastroesophageal reflux disease)    Hyperlipidemia    Hypertension    Sleep apnea    cpap   Subclinical hyperthyroidism     Current Outpatient Medications  Medication Sig Dispense Refill   aspirin EC 81 MG tablet Take 81 mg by mouth daily.     atorvastatin (LIPITOR) 80 MG tablet Take 1 tablet (80 mg total) by mouth daily. 90 tablet 3   fluticasone (FLONASE) 50 MCG/ACT nasal spray SPRAY 2 SPRAYS INTO EACH NOSTRIL EVERY DAY 48 mL 3   gabapentin (NEURONTIN) 300 MG capsule Take by mouth.     ibuprofen (ADVIL) 800 MG  tablet Take 800 mg by mouth 3 (three) times daily as needed for moderate pain.      loratadine (CLARITIN) 10 MG tablet Take 1 tablet (10 mg total) by mouth daily. (Patient taking differently: Take 20 mg by mouth daily.) 30 tablet 11   losartan (COZAAR) 100 MG tablet TAKE 1 TABLET DAILY        (DISCONTINUE LISINOPRIL    PRESCRIPTION) 90 tablet 1   meloxicam (MOBIC) 15 MG tablet Take 15 mg by mouth daily.     Multiple Vitamin (MULTIVITAMIN WITH MINERALS) TABS tablet Take 1 tablet by mouth at bedtime.     nebivolol (BYSTOLIC) 5 MG tablet Take 1 tablet (5 mg total) by mouth daily. 90 tablet 3   nitroGLYCERIN (NITROSTAT) 0.4 MG SL tablet Place 1 tablet (0.4 mg total) under the tongue every 5 (five) minutes as needed for chest pain. 25 tablet 0   pantoprazole (PROTONIX) 40 MG tablet Take 1 tablet (40 mg total) by mouth 2 (two) times daily before a meal. 60 tablet 1   topiramate (TOPAMAX) 100 MG tablet Take 1 tablet (100 mg total) by mouth 2 (two) times daily. 180 tablet 1   No current facility-administered medications for this visit.    Allergies as of 12/04/2021 -  Review Complete 11/08/2021  Allergen Reaction Noted   Bee pollen Other (See Comments) and Cough 04/08/2018   Short ragweed pollen ext Other (See Comments) 06/14/2021   Pollen extract      ROS:  General: Negative for anorexia, weight loss, fever, chills, fatigue, weakness. ENT: Negative for hoarseness, difficulty swallowing , nasal congestion. CV: Negative for chest pain, angina, palpitations, dyspnea on exertion, peripheral edema.  Respiratory: Negative for dyspnea at rest, dyspnea on exertion, cough, sputum, wheezing.  GI: See history of present illness. GU:  Negative for dysuria, hematuria, urinary incontinence, urinary frequency, nocturnal urination.  Endo: Negative for unusual weight change.    Physical Examination:   BP 113/72 (BP Location: Left Arm, Patient Position: Sitting, Cuff Size: Large)   Pulse (!) 56   Ht '5\' 7"'$   (1.702 m)   Wt 208 lb 3.2 oz (94.4 kg)   BMI 32.61 kg/m   General: Well-nourished, well-developed in no acute distress.  Eyes: No icterus. Conjunctivae pink. Lungs: Clear to auscultation bilaterally. Non-labored. Heart: Regular rate and rhythm, no murmurs rubs or gallops.  Abdomen: Bowel sounds are normal, nontender, nondistended, no hepatosplenomegaly or masses, no abdominal bruits or hernia , no rebound or guarding.   Extremities: No lower extremity edema. No clubbing or deformities. Neuro: Alert and oriented x 3.  Grossly intact. Skin: Warm and dry, no jaundice.   Psych: Alert and cooperative, normal mood and affect.  Labs:    Imaging Studies: MR CERVICAL SPINE WO CONTRAST  Result Date: 11/30/2021 CLINICAL DATA:  Cervical radiculitis; technologist note states neck pain with right arm pain and numbness EXAM: MRI CERVICAL SPINE WITHOUT CONTRAST TECHNIQUE: Multiplanar, multisequence MR imaging of the cervical spine was performed. No intravenous contrast was administered. COMPARISON:  None Available. FINDINGS: Alignment: No significant listhesis. Vertebrae: Vertebral body heights are maintained. No substantial marrow edema. No suspicious osseous lesion. Cord: Normal caliber and signal. Posterior Fossa, vertebral arteries, paraspinal tissues: Unremarkable. Disc levels: C2-C3:  No canal or foraminal stenosis. C3-C4:  Disc bulge.  Minor canal stenosis.  No foraminal stenosis. C4-C5: Disc bulge with superimposed central protrusion. Marked canal stenosis. No foraminal stenosis. C5-C6: Disc bulge with endplate osteophytes. Uncovertebral hypertrophy. Marked canal stenosis. Minor right and moderate left foraminal stenosis. C6-C7: Disc bulge with endplate osteophytes. Uncovertebral hypertrophy. Mild canal stenosis. No right foraminal stenosis. Mild left foraminal stenosis. C7-T1:  No canal or foraminal stenosis. IMPRESSION: Multilevel degenerative changes as detailed above. There is marked canal narrowing  at C4-C5 and C5-C6 without abnormal cord signal. Electronically Signed   By: Macy Mis M.D.   On: 11/30/2021 10:38    Assessment and Plan:   Wesley Bridges is a 51 y.o. y/o male who comes in today with a history of  needing a screening colonoscopy.  The patient has also had chronic heartburn for which she takes pantoprazole twice a day.  The patient states that this is covering his breakthrough heartburn.  The patient will be set up for an EGD and colonoscopy.  The patient will follow-up at time of the procedure.  The patient has been explained the plan agrees with it.    Lucilla Lame, MD. Marval Regal    Note: This dictation was prepared with Dragon dictation along with smaller phrase technology. Any transcriptional errors that result from this process are unintentional.

## 2021-12-04 NOTE — H&P (View-Only) (Signed)
Primary Care Physician: Wesley Haven, MD  Primary Gastroenterologist:  Dr. Lucilla Bridges  Chief Complaint  Patient presents with   Gastroesophageal Reflux    HPI: Wesley Bridges is a 51 y.o. male here with a history of being seen by [facility] clinic GI up until 2017 and then was seen by Dr. Vicente Bridges.  The patient then subsequently saw Dr. Bonna Bridges with his last visit with her was back in 2021.  The patient has a history of reflux.  At the last office visit the patient was recommended to undergo a upper endoscopy to rule out Barrett's due to his GERD and some intermittent dysphagia.  The patient was also recommended to undergo a colonoscopy for screening purposes. The patient reports that he has increase his pantoprazole to twice a day due to acid breakthrough.  The patient also reports that he has had chronic heartburn.  The patient denies any unexplained weight loss black stools or bloody stools.  He also denies any nausea or vomiting. There is no report of dysphagia.   Past Medical History:  Diagnosis Date   Anginal pain ([place])    Arthritis    Asthma    Broken jaw ([place])    approx 25 yrs afo.  some limitations opening mouth   Chronic headaches    Complication of anesthesia    takes longer to wake up   Depression    Dyspnea    Dysrhythmia    Family history of adverse reaction to anesthesia    son takes longer to wake up than a normal person   GERD (gastroesophageal reflux disease)    Hyperlipidemia    Hypertension    Sleep apnea    cpap   Subclinical hyperthyroidism     Current Outpatient Medications  Medication Sig Dispense Refill   aspirin EC 81 MG tablet Take 81 mg by mouth daily.     atorvastatin (LIPITOR) 80 MG tablet Take 1 tablet (80 mg total) by mouth daily. 90 tablet 3   fluticasone (FLONASE) 50 MCG/ACT nasal spray SPRAY 2 SPRAYS INTO EACH NOSTRIL EVERY DAY 48 mL 3   gabapentin (NEURONTIN) 300 MG capsule Take by mouth.     ibuprofen (ADVIL) 800 MG  tablet Take 800 mg by mouth 3 (three) times daily as needed for moderate pain.      loratadine (CLARITIN) 10 MG tablet Take 1 tablet (10 mg total) by mouth daily. (Patient taking differently: Take 20 mg by mouth daily.) 30 tablet 11   losartan (COZAAR) 100 MG tablet TAKE 1 TABLET DAILY        (DISCONTINUE LISINOPRIL    PRESCRIPTION) 90 tablet 1   meloxicam (MOBIC) 15 MG tablet Take 15 mg by mouth daily.     Multiple Vitamin (MULTIVITAMIN WITH MINERALS) TABS tablet Take 1 tablet by mouth at bedtime.     nebivolol (BYSTOLIC) 5 MG tablet Take 1 tablet (5 mg total) by mouth daily. 90 tablet 3   nitroGLYCERIN (NITROSTAT) 0.4 MG SL tablet Place 1 tablet (0.4 mg total) under the tongue every 5 (five) minutes as needed for chest pain. 25 tablet 0   pantoprazole (PROTONIX) 40 MG tablet Take 1 tablet (40 mg total) by mouth 2 (two) times daily before a meal. 60 tablet 1   topiramate (TOPAMAX) 100 MG tablet Take 1 tablet (100 mg total) by mouth 2 (two) times daily. 180 tablet 1   No current facility-administered medications for this visit.    Allergies as of 12/04/2021 -  Review Complete 11/08/2021  Allergen Reaction Noted   Bee pollen Other (See Comments) and Cough 04/08/2018   Short ragweed pollen ext Other (See Comments) 06/14/2021   Pollen extract      ROS:  General: Negative for anorexia, weight loss, fever, chills, fatigue, weakness. ENT: Negative for hoarseness, difficulty swallowing , nasal congestion. CV: Negative for chest pain, angina, palpitations, dyspnea on exertion, peripheral edema.  Respiratory: Negative for dyspnea at rest, dyspnea on exertion, cough, sputum, wheezing.  GI: See history of present illness. GU:  Negative for dysuria, hematuria, urinary incontinence, urinary frequency, nocturnal urination.  Endo: Negative for unusual weight change.    Physical Examination:   BP 113/72 (BP Location: Left Arm, Patient Position: Sitting, Cuff Size: Large)   Pulse (!) 56   Ht '5\' 7"'$   (1.702 m)   Wt 208 lb 3.2 oz (94.4 kg)   BMI 32.61 kg/m   General: Well-nourished, well-developed in no acute distress.  Eyes: No icterus. Conjunctivae pink. Lungs: Clear to auscultation bilaterally. Non-labored. Heart: Regular rate and rhythm, no murmurs rubs or gallops.  Abdomen: Bowel sounds are normal, nontender, nondistended, no hepatosplenomegaly or masses, no abdominal bruits or hernia , no rebound or guarding.   Extremities: No lower extremity edema. No clubbing or deformities. Neuro: Alert and oriented x 3.  Grossly intact. Skin: Warm and dry, no jaundice.   Psych: Alert and cooperative, normal mood and affect.  Labs:    Imaging Studies: MR CERVICAL SPINE WO CONTRAST  Result Date: 11/30/2021 CLINICAL DATA:  Cervical radiculitis; technologist note states neck pain with right arm pain and numbness EXAM: MRI CERVICAL SPINE WITHOUT CONTRAST TECHNIQUE: Multiplanar, multisequence MR imaging of the cervical spine was performed. No intravenous contrast was administered. COMPARISON:  None Available. FINDINGS: Alignment: No significant listhesis. Vertebrae: Vertebral body heights are maintained. No substantial marrow edema. No suspicious osseous lesion. Cord: Normal caliber and signal. Posterior Fossa, vertebral arteries, paraspinal tissues: Unremarkable. Disc levels: C2-C3:  No canal or foraminal stenosis. C3-C4:  Disc bulge.  Minor canal stenosis.  No foraminal stenosis. C4-C5: Disc bulge with superimposed central protrusion. Marked canal stenosis. No foraminal stenosis. C5-C6: Disc bulge with endplate osteophytes. Uncovertebral hypertrophy. Marked canal stenosis. Minor right and moderate left foraminal stenosis. C6-C7: Disc bulge with endplate osteophytes. Uncovertebral hypertrophy. Mild canal stenosis. No right foraminal stenosis. Mild left foraminal stenosis. C7-T1:  No canal or foraminal stenosis. IMPRESSION: Multilevel degenerative changes as detailed above. There is marked canal narrowing  at C4-C5 and C5-C6 without abnormal cord signal. Electronically Signed   By: Wesley Bridges M.D.   On: 11/30/2021 10:38    Assessment and Plan:   Wesley Bridges is a 51 y.o. y/o male who comes in today with a history of  needing a screening colonoscopy.  The patient has also had chronic heartburn for which she takes pantoprazole twice a day.  The patient states that this is covering his breakthrough heartburn.  The patient will be set up for an EGD and colonoscopy.  The patient will follow-up at time of the procedure.  The patient has been explained the plan agrees with it.    Wesley Lame, MD. Marval Regal    Note: This dictation was prepared with Dragon dictation along with smaller phrase technology. Any transcriptional errors that result from this process are unintentional.

## 2021-12-06 ENCOUNTER — Other Ambulatory Visit: Payer: Self-pay

## 2021-12-06 DIAGNOSIS — K219 Gastro-esophageal reflux disease without esophagitis: Secondary | ICD-10-CM

## 2021-12-06 DIAGNOSIS — Z1211 Encounter for screening for malignant neoplasm of colon: Secondary | ICD-10-CM

## 2021-12-12 ENCOUNTER — Encounter: Payer: Self-pay | Admitting: Gastroenterology

## 2021-12-13 ENCOUNTER — Encounter: Payer: Self-pay | Admitting: Gastroenterology

## 2021-12-13 ENCOUNTER — Encounter (INDEPENDENT_AMBULATORY_CARE_PROVIDER_SITE_OTHER): Payer: Self-pay

## 2021-12-20 ENCOUNTER — Other Ambulatory Visit: Payer: Self-pay | Admitting: Family Medicine

## 2021-12-20 DIAGNOSIS — K219 Gastro-esophageal reflux disease without esophagitis: Secondary | ICD-10-CM

## 2021-12-21 ENCOUNTER — Ambulatory Visit
Admission: RE | Admit: 2021-12-21 | Discharge: 2021-12-21 | Disposition: A | Discharge status: Home / Self Care | Payer: Medicare Other | Attending: Gastroenterology | Admitting: Gastroenterology

## 2021-12-21 ENCOUNTER — Other Ambulatory Visit: Payer: Self-pay

## 2021-12-21 ENCOUNTER — Encounter: Payer: Self-pay | Admitting: Gastroenterology

## 2021-12-21 ENCOUNTER — Ambulatory Visit: Payer: Medicare Other | Admitting: Anesthesiology

## 2021-12-21 ENCOUNTER — Encounter
Admission: RE | Disposition: A | Discharge status: Home / Self Care | Payer: Self-pay | Source: Home / Self Care | Attending: Gastroenterology

## 2021-12-21 DIAGNOSIS — I10 Essential (primary) hypertension: Secondary | ICD-10-CM | POA: Diagnosis not present

## 2021-12-21 DIAGNOSIS — K219 Gastro-esophageal reflux disease without esophagitis: Secondary | ICD-10-CM | POA: Insufficient documentation

## 2021-12-21 DIAGNOSIS — K449 Diaphragmatic hernia without obstruction or gangrene: Secondary | ICD-10-CM | POA: Insufficient documentation

## 2021-12-21 DIAGNOSIS — K2289 Other specified disease of esophagus: Secondary | ICD-10-CM | POA: Insufficient documentation

## 2021-12-21 DIAGNOSIS — Z79899 Other long term (current) drug therapy: Secondary | ICD-10-CM | POA: Diagnosis not present

## 2021-12-21 DIAGNOSIS — Z1211 Encounter for screening for malignant neoplasm of colon: Secondary | ICD-10-CM | POA: Insufficient documentation

## 2021-12-21 DIAGNOSIS — K640 First degree hemorrhoids: Secondary | ICD-10-CM | POA: Diagnosis not present

## 2021-12-21 DIAGNOSIS — R1310 Dysphagia, unspecified: Secondary | ICD-10-CM | POA: Diagnosis not present

## 2021-12-21 HISTORY — PX: COLONOSCOPY WITH PROPOFOL: SHX5780

## 2021-12-21 HISTORY — PX: ESOPHAGOGASTRODUODENOSCOPY (EGD) WITH PROPOFOL: SHX5813

## 2021-12-21 SURGERY — COLONOSCOPY WITH PROPOFOL
Anesthesia: General | Site: Rectum

## 2021-12-21 MED ORDER — LACTATED RINGERS IV SOLN: INTRAVENOUS | Status: DC | PRN

## 2021-12-21 MED ORDER — LIDOCAINE HCL (CARDIAC) PF 100 MG/5ML IV SOSY
PREFILLED_SYRINGE | INTRAVENOUS | Status: DC | PRN
  Administered 2021-12-21: 30 mg via INTRAVENOUS

## 2021-12-21 MED ORDER — STERILE WATER FOR IRRIGATION IR SOLN
Status: DC | PRN
  Administered 2021-12-21: 50 mL

## 2021-12-21 MED ORDER — GLYCOPYRROLATE 0.2 MG/ML IJ SOLN
INTRAMUSCULAR | Status: DC | PRN
  Administered 2021-12-21: .1 mg via INTRAVENOUS

## 2021-12-21 MED ORDER — SODIUM CHLORIDE 0.9 % IV SOLN: INTRAVENOUS | Status: DC

## 2021-12-21 MED ORDER — PROPOFOL 10 MG/ML IV BOLUS
INTRAVENOUS | Status: DC | PRN
  Administered 2021-12-21: 30 mg via INTRAVENOUS
  Administered 2021-12-21: 20 mg via INTRAVENOUS
  Administered 2021-12-21: 30 mg via INTRAVENOUS
  Administered 2021-12-21 (×2): 40 mg via INTRAVENOUS
  Administered 2021-12-21: 30 mg via INTRAVENOUS
  Administered 2021-12-21: 150 mg via INTRAVENOUS
  Administered 2021-12-21: 30 mg via INTRAVENOUS

## 2021-12-21 SURGICAL SUPPLY — 7 items
BLOCK BITE 60FR ADLT L/F GRN (MISCELLANEOUS) ×3 IMPLANT
GOWN CVR UNV OPN BCK APRN NK (MISCELLANEOUS) ×4 IMPLANT
GOWN ISOL THUMB LOOP REG UNIV (MISCELLANEOUS) ×6
KIT PRC NS LF DISP ENDO (KITS) ×2 IMPLANT
KIT PROCEDURE OLYMPUS (KITS) ×3
MANIFOLD NEPTUNE II (INSTRUMENTS) ×3 IMPLANT
WATER STERILE IRR 250ML POUR (IV SOLUTION) ×3 IMPLANT

## 2021-12-21 NOTE — Transfer of Care (Signed)
Immediate Anesthesia Transfer of Care Note  Patient: Wesley Bridges  Procedure(s) Performed: COLONOSCOPY WITH PROPOFOL (Rectum) ESOPHAGOGASTRODUODENOSCOPY (EGD) WITH PROPOFOL (Mouth)  Patient Location: PACU  Anesthesia Type: General  Level of Consciousness: awake, alert  and patient cooperative  Airway and Oxygen Therapy: Patient Spontanous Breathing and Patient connected to supplemental oxygen  Post-op Assessment: Post-op Vital signs reviewed, Patient's Cardiovascular Status Stable, Respiratory Function Stable, Patent Airway and No signs of Nausea or vomiting  Post-op Vital Signs: Reviewed and stable  Complications: No notable events documented.

## 2021-12-21 NOTE — Op Note (Signed)
[facility] Gastroenterology Patient Name: Wesley Bridges Procedure Date: 12/21/2021 9:21 AM MRN: [id number] Account #: [id number] Date of Birth: 04-13-1971 Admit Type: Outpatient Age: 51 Room: [facility] OR ROOM 01 Gender: Male Note Status: Finalized Instrument Name: [id number] Procedure:             Colonoscopy Indications:           Screening for colorectal malignant neoplasm Providers:             Lucilla Lame MD, MD Medicines:             Propofol per Anesthesia Complications:         No immediate complications. Procedure:             Pre-Anesthesia Assessment:                        - Prior to the procedure, a History and Physical was                         performed, and patient medications and allergies were                         reviewed. The patient's tolerance of previous                         anesthesia was also reviewed. The risks and benefits                         of the procedure and the sedation options and risks                         were discussed with the patient. All questions were                         answered, and informed consent was obtained. Prior                         Anticoagulants: The patient has taken no previous                         anticoagulant or antiplatelet agents. ASA Grade                         Assessment: II - A patient with mild systemic disease.                         After reviewing the risks and benefits, the patient                         was deemed in satisfactory condition to undergo the                         procedure.                        After obtaining informed consent, the colonoscope was                         passed under direct vision. Throughout the procedure,  the patient's blood pressure, pulse, and oxygen                         saturations were monitored continuously. The                         Colonoscope was introduced through the anus and                          advanced to the the cecum, identified by appendiceal                         orifice and ileocecal valve. The colonoscopy was                         performed without difficulty. The patient tolerated                         the procedure well. The quality of the bowel                         preparation was excellent. Findings:      The perianal and digital rectal examinations were normal.      Non-bleeding internal hemorrhoids were found during retroflexion. The       hemorrhoids were Grade I (internal hemorrhoids that do not prolapse). Impression:            - Non-bleeding internal hemorrhoids.                        - No specimens collected. Recommendation:        - Discharge patient to home.                        - Resume previous diet.                        - Continue present medications.                        - Repeat colonoscopy in 10 years for screening                         purposes. Procedure Code(s):     --- Professional ---                        [phone], Colonoscopy, flexible; diagnostic, including                         collection of specimen(s) by brushing or washing, when                         performed (separate procedure) Diagnosis Code(s):     --- Professional ---                        Z12.11, Encounter for screening for malignant neoplasm                         of colon CPT copyright 2019 American [employer] Association. All rights reserved. The  codes documented in this report are preliminary and upon coder review may  be revised to meet current compliance requirements. Lucilla Lame MD, MD 12/21/2021 9:46:29 AM This report has been signed electronically. Number of Addenda: 0 Note Initiated On: 12/21/2021 9:21 AM Scope Withdrawal Time: 0 hours 6 minutes 44 seconds  Total Procedure Duration: 0 hours 9 minutes 9 seconds  Estimated Blood Loss:  Estimated blood loss: none.      [facility]

## 2021-12-21 NOTE — Op Note (Signed)
[facility] Gastroenterology Patient Name: Wesley Bridges Procedure Date: 12/21/2021 9:22 AM MRN: [id number] Account #: [id number] Date of Birth: October 17, 1970 Admit Type: Outpatient Age: 51 Room: [facility] OR ROOM 01 Gender: Male Note Status: Finalized Instrument Name: Jaclyn Prime [id number] Procedure:             Upper GI endoscopy Indications:           Heartburn Providers:             Lucilla Lame MD, MD Referring MD:          Angela Adam. Caryl Bis (Referring MD) Medicines:             Propofol per Anesthesia Complications:         No immediate complications. Procedure:             Pre-Anesthesia Assessment:                        - Prior to the procedure, a History and Physical was                         performed, and patient medications and allergies were                         reviewed. The patient's tolerance of previous                         anesthesia was also reviewed. The risks and benefits                         of the procedure and the sedation options and risks                         were discussed with the patient. All questions were                         answered, and informed consent was obtained. Prior                         Anticoagulants: The patient has taken no previous                         anticoagulant or antiplatelet agents. ASA Grade                         Assessment: II - A patient with mild systemic disease.                         After reviewing the risks and benefits, the patient                         was deemed in satisfactory condition to undergo the                         procedure.                        After obtaining informed consent, the endoscope was  passed under direct vision. Throughout the procedure,                         the patient's blood pressure, pulse, and oxygen                         saturations were monitored continuously. The was                         introduced through the mouth,  and advanced to the                         second part of duodenum. The upper GI endoscopy was                         accomplished without difficulty. The patient tolerated                         the procedure well. Findings:      The Z-line was irregular and was found at the gastroesophageal junction.      A small hiatal hernia was present.      The stomach was normal.      The examined duodenum was normal. Impression:            - Z-line irregular, at the gastroesophageal junction.                        - Small hiatal hernia.                        - Normal stomach.                        - Normal examined duodenum.                        - No specimens collected. Recommendation:        - Discharge patient to home.                        - Resume previous diet.                        - Continue present medications. Procedure Code(s):     --- Professional ---                        [phone], Esophagogastroduodenoscopy, flexible,                         transoral; diagnostic, including collection of                         specimen(s) by brushing or washing, when performed                         (separate procedure) Diagnosis Code(s):     --- Professional ---                        R12, Heartburn  K22.8, Other specified diseases of esophagus CPT copyright 2019 American Medical Association. All rights reserved. The codes documented in this report are preliminary and upon coder review may  be revised to meet current compliance requirements. Lucilla Lame MD, MD 12/21/2021 9:34:41 AM This report has been signed electronically. Number of Addenda: 0 Note Initiated On: 12/21/2021 9:22 AM Total Procedure Duration: 0 hours 1 minute 34 seconds  Estimated Blood Loss:  Estimated blood loss: none.      [facility]

## 2021-12-21 NOTE — Interval H&P Note (Signed)
Lucilla Lame, MD [facility] [address] [place], [place] [address] Phone:[phone] Fax : [phone]  Primary Care Physician:  Leone Haven, MD Primary Gastroenterologist:  Dr. Allen Norris  Pre-Procedure History & Physical: HPI:  Wesley Bridges is a 51 y.o. male is here for an endoscopy and colonoscopy.   Past Medical History:  Diagnosis Date   Anginal pain ([place])    normal cath 2018   Arthritis    Asthma    Broken jaw ([place])    approx 25 yrs afo.  some limitations opening mouth   Chronic headaches    Complication of anesthesia    takes longer to wake up   Depression    Dyspnea    Dysrhythmia    Family history of adverse reaction to anesthesia    son takes longer to wake up than a normal person   GERD (gastroesophageal reflux disease)    Hyperlipidemia    Hypertension    Sleep apnea    cpap   Subclinical hyperthyroidism     Past Surgical History:  Procedure Laterality Date   ANKLE FRACTURE SURGERY     ANKLE FUSION     [facility]   CARDIAC CATHETERIZATION     no stent   ELBOW ARTHROSCOPY Right 09/01/2019   Procedure: RIGHT ELBOW ARTHROSCOPY WITH DEBRIDEMENT AND EXCISION OF LOOSE BODIES;  Surgeon: Corky Mull, MD;  Location: [facility];  Service: Orthopedics;  Laterality: Right;   FEMUR FRACTURE SURGERY     FRACTURE SURGERY     HERNIA REPAIR Bilateral    inguinal   I & D EXTREMITY Right 07/30/2018   Procedure: DEBRIDEMENT OF THE COMMON EXTENSOR ORIGIN OF ELBOW;  Surgeon: Corky Mull, MD;  Location: [place];  Service: Orthopedics;  Laterality: Right;  1.45 BIOMET JUGGERKNOT ANCHORS   KNEE ARTHROSCOPY WITH MEDIAL MENISECTOMY Left 05/13/2019   Procedure: KNEE ARTHROSCOPY WITH DEBRIDEMENT AND REPAIR CH;  Surgeon: Corky Mull, MD;  Location: [facility];  Service: Orthopedics;  Laterality: Left;   LEFT HEART CATH AND CORONARY ANGIOGRAPHY N/A 01/08/2017   Procedure: Left Heart Cath and Coronary Angiography;  Surgeon: Nelva Bush, MD;  Location: [place] [facility];  Service: Cardiovascular;  Laterality: N/A;   MANDIBLE FRACTURE SURGERY     ROBOT ASSISTED INGUINAL HERNIA REPAIR Bilateral 04/17/2018   Procedure: ROBOT ASSISTED INGUINAL HERNIA REPAIR;  Surgeon: Jules Husbands, MD;  Location: [facility];  Service: General;  Laterality: Bilateral;    Prior to Admission medications   Medication Sig Start Date End Date Taking? Authorizing Provider  aspirin EC 81 MG tablet Take 81 mg by mouth daily.   Yes [provider]  atorvastatin (LIPITOR) 80 MG tablet Take 1 tablet (80 mg total) by mouth daily. 08/08/21  Yes Leone Haven, MD  fluticasone [facility]) 50 MCG/ACT nasal spray SPRAY 2 SPRAYS INTO EACH NOSTRIL EVERY DAY 07/28/21  Yes Leone Haven, MD  gabapentin (NEURONTIN) 300 MG capsule Take 300 mg by mouth 3 (three) times daily. 10/11/21  Yes [provider]  ibuprofen (ADVIL) 800 MG tablet Take 800 mg by mouth 3 (three) times daily as needed for moderate pain.  01/23/19  Yes [provider]  losartan (COZAAR) 100 MG tablet TAKE 1 TABLET DAILY        (DISCONTINUE LISINOPRIL    PRESCRIPTION) 07/28/21  Yes Leone Haven, MD  meloxicam (MOBIC) 15 MG tablet Take 15 mg by mouth daily. 11/06/21  Yes [provider]  Multiple Vitamin (MULTIVITAMIN WITH MINERALS) TABS tablet Take 1 tablet by mouth at bedtime.   Yes [provider]  nebivolol (BYSTOLIC) 5 MG tablet Take 1 tablet (5 mg total) by mouth daily. 08/09/21  Yes Leone Haven, MD  pantoprazole (PROTONIX) 40 MG tablet TAKE 1 TABLET TWICE A DAY BEFORE MEALS 12/20/21  Yes Leone Haven, MD  topiramate (TOPAMAX) 100 MG tablet Take 1 tablet (100 mg total) by mouth 2 (two) times daily. 07/28/21  Yes Leone Haven, MD  loratadine (CLARITIN) 10 MG tablet Take 1 tablet (10 mg total) by mouth daily. Patient taking differently: Take 20 mg by mouth daily. 04/18/16   Leone Haven, MD  nitroGLYCERIN (NITROSTAT) 0.4 MG SL tablet Place 1 tablet  (0.4 mg total) under the tongue every 5 (five) minutes as needed for chest pain. 06/05/18   End, Harrell Gave, MD  Sod Picosulfate-Mag Ox-Cit Acd (CLENPIQ) 10-3.5-12 MG-GM -GM/160ML SOLN Take 320 mLs by mouth as directed. 12/04/21   Lucilla Lame, MD    Allergies as of 12/06/2021 - Review Complete 12/04/2021  Allergen Reaction Noted   Bee pollen Other (See Comments) and Cough 04/08/2018   Short ragweed pollen ext Other (See Comments) 06/14/2021   Pollen extract      Family History  Problem Relation Age of Onset   Arthritis Other        parents   Hypertension Mother    Thyroid disease Mother    Osteoporosis Mother    Syncope episode Mother    Hypertension Brother     Social History   Socioeconomic History   Marital status: Married    Spouse name: Not on file   Number of children: 6   Years of education: Not on file   Highest education level: High school graduate  Occupational History   Not on file  Tobacco Use   Smoking status: Never   Smokeless tobacco: Never  Vaping Use   Vaping Use: Never used  Substance and Sexual Activity   Alcohol use: Yes    Alcohol/week: 14.0 standard drinks of alcohol    Types: 14 Cans of beer per week    Comment: 2-3 a day    Drug use: No   Sexual activity: Not on file  Other Topics Concern   Not on file  Social History Narrative   Lives at home with family. Independent at baseline.   Social Determinants of Health   Financial Resource Strain: Low Risk  (11/06/2021)   Overall Financial Resource Strain (CARDIA)    Difficulty of Paying Living Expenses: Not hard at all  Food Insecurity: No Food Insecurity (11/06/2021)   Hunger Vital Sign    Worried About Running Out of Food in the Last Year: Never true    Ran Out of Food in the Last Year: Never true  Transportation Needs: No Transportation Needs (11/06/2021)   PRAPARE - Hydrologist (Medical): No    Lack of Transportation (Non-Medical): No  Physical Activity:  Insufficiently Active (11/06/2021)   Exercise Vital Sign    Days of Exercise per Week: 1 day    Minutes of Exercise per Session: 60 min  Stress: No Stress Concern Present (11/06/2021)   [place]    Feeling of Stress : Not at all  Social Connections: Unknown (11/06/2021)   Social Connection and Isolation Panel [NHANES]    Frequency of Communication with Friends and Family: Not on file  Frequency of Social Gatherings with Friends and Family: Not on file    Attends Religious Services: Not on file    Active Member of Clubs or Organizations: Not on file    Attends Club or Organization Meetings: Not on file    Marital Status: Married  Intimate Partner Violence: Not At Risk (11/06/2021)   Humiliation, Afraid, Rape, and Kick questionnaire    Fear of Current or Ex-Partner: No    Emotionally Abused: No    Physically Abused: No    Sexually Abused: No    Review of Systems: See HPI, otherwise negative ROS  Physical Exam: BP (!) 147/85   Pulse (!) 59   Temp (!) 97.4 F (36.3 C)   Ht '5\' 7"'$  (1.702 m)   Wt 91.6 kg   SpO2 100%   BMI 31.64 kg/m  General:   Alert,  pleasant and cooperative in NAD Head:  Normocephalic and atraumatic. Neck:  Supple; no masses or thyromegaly. Lungs:  Clear throughout to auscultation.    Heart:  Regular rate and rhythm. Abdomen:  Soft, nontender and nondistended. Normal bowel sounds, without guarding, and without rebound.   Neurologic:  Alert and  oriented x4;  grossly normal neurologically.  Impression/Plan: Wesley Bridges is here for an endoscopy and colonoscopy to be performed for screening GERD  Risks, benefits, limitations, and alternatives regarding  endoscopy and colonoscopy have been reviewed with the patient.  Questions have been answered.  All parties agreeable.   Lucilla Lame, MD  12/21/2021, 8:54 AM

## 2021-12-21 NOTE — Anesthesia Procedure Notes (Signed)
Date/Time: 12/21/2021 9:27 AM  Performed by: Cameron Ali, CRNAPre-anesthesia Checklist: Patient identified, Emergency Drugs available, Suction available, Timeout performed and Patient being monitored Patient Re-evaluated:Patient Re-evaluated prior to induction Oxygen Delivery Method: Nasal cannula Placement Confirmation: positive ETCO2

## 2021-12-21 NOTE — Anesthesia Preprocedure Evaluation (Signed)
Anesthesia Evaluation  Patient identified by MRN, date of birth, ID band Patient awake    History of Anesthesia Complications (+) PROLONGED EMERGENCE and history of anesthetic complications  Airway Mallampati: III  TM Distance: >3 FB Neck ROM: Full  Mouth opening: Limited Mouth Opening  Dental no notable dental hx.    Pulmonary sleep apnea ,    Pulmonary exam normal        Cardiovascular Exercise Tolerance: Good hypertension, Normal cardiovascular exam     Neuro/Psych negative neurological ROS  negative psych ROS   GI/Hepatic GERD  Medicated,  Endo/Other    Renal/GU      Musculoskeletal   Abdominal   Peds  Hematology   Anesthesia Other Findings   Reproductive/Obstetrics                             Anesthesia Physical Anesthesia Plan  ASA: 3  Anesthesia Plan: General   Post-op Pain Management: Minimal or no pain anticipated   Induction: Intravenous  PONV Risk Score and Plan: 2 and Propofol infusion, TIVA and Treatment may vary due to age or medical condition  Airway Management Planned: Nasal Cannula and Natural Airway  Additional Equipment: None  Intra-op Plan:   Post-operative Plan:   Informed Consent: I have reviewed the patients History and Physical, chart, labs and discussed the procedure including the risks, benefits and alternatives for the proposed anesthesia with the patient or authorized representative who has indicated his/her understanding and acceptance.       Plan Discussed with: CRNA  Anesthesia Plan Comments:         Anesthesia Quick Evaluation

## 2021-12-21 NOTE — Anesthesia Postprocedure Evaluation (Signed)
Anesthesia Post Note  Patient: Wesley Bridges  Procedure(s) Performed: COLONOSCOPY WITH PROPOFOL (Rectum) ESOPHAGOGASTRODUODENOSCOPY (EGD) WITH PROPOFOL (Mouth)     Patient location during evaluation: PACU Anesthesia Type: General Level of consciousness: awake and alert Pain management: pain level controlled Vital Signs Assessment: post-procedure vital signs reviewed and stable Respiratory status: spontaneous breathing, nonlabored ventilation, respiratory function stable and patient connected to nasal cannula oxygen Cardiovascular status: blood pressure returned to baseline and stable Postop Assessment: no apparent nausea or vomiting Anesthetic complications: no   No notable events documented.  Adele Barthel Olanda Downie

## 2021-12-25 ENCOUNTER — Encounter: Payer: Self-pay | Admitting: Gastroenterology

## 2022-01-02 NOTE — Progress Notes (Unsigned)
Referring Physician:  Leone Haven, MD [address] [place],  [place] [id number]  Primary Physician:  Leone Haven, MD  History of Present Illness: 01/02/2022 [name] is here today with a chief complaint of trouble with his balance.  He has neck pain in the right side of his neck that extends into his trapezius muscle.  He has numbness and tingling in his fingertips and problems of the right hand weakness.  He gets numbness in his arms and the left arm when he sleeps on his left side.  He also gets that sometimes intermittently in his right arm.  He reports balance issues but no falls.  He previously had right forearm surgery which complicates some of the findings in his right arm.  His pain can be as bad as 6 out of 10.  General activity and standing, walking, bending, and lifting make his pain worse.  Laying in bed makes it better.  His discomfort is constant.  Bowel/Bladder Dysfunction: none  Conservative measures:  Physical therapy: participated in 1 visit at [facility] 11/10/21 Multimodal medical therapy including regular antiinflammatories:  gabapentin, ibuprofen, meloxicam,  Injections:  has not received epidural steroid injections  Past Surgery: denies  Dray A [name] has symptoms of cervical myelopathy.  The symptoms are causing a significant impact on the patient's life.   Review of Systems:  A 10 point review of systems is negative, except for the pertinent positives and negatives detailed in the HPI.  Past Medical History: Past Medical History:  Diagnosis Date   Anginal pain ([place])    normal cath 2018   Arthritis    Asthma    Broken jaw ([place])    approx 25 yrs afo.  some limitations opening mouth   Chronic headaches    Complication of anesthesia    takes longer to wake up   Depression    Dyspnea    Dysrhythmia    Family history of adverse reaction to anesthesia    son takes longer to wake up than a normal person    GERD (gastroesophageal reflux disease)    Hyperlipidemia    Hypertension    Sleep apnea    cpap   Subclinical hyperthyroidism     Past Surgical History: Past Surgical History:  Procedure Laterality Date   ANKLE FRACTURE SURGERY     ANKLE FUSION     [facility]   CARDIAC CATHETERIZATION     no stent   COLONOSCOPY WITH PROPOFOL N/A 12/21/2021   Procedure: COLONOSCOPY WITH PROPOFOL;  Surgeon: Lucilla Lame, MD;  Location: [place];  Service: Endoscopy;  Laterality: N/A;   ELBOW ARTHROSCOPY Right 09/01/2019   Procedure: RIGHT ELBOW ARTHROSCOPY WITH DEBRIDEMENT AND EXCISION OF LOOSE BODIES;  Surgeon: Corky Mull, MD;  Location: [facility];  Service: Orthopedics;  Laterality: Right;   ESOPHAGOGASTRODUODENOSCOPY (EGD) WITH PROPOFOL N/A 12/21/2021   Procedure: ESOPHAGOGASTRODUODENOSCOPY (EGD) WITH PROPOFOL;  Surgeon: Lucilla Lame, MD;  Location: [place];  Service: Endoscopy;  Laterality: N/A;   FEMUR FRACTURE SURGERY     FRACTURE SURGERY     HERNIA REPAIR Bilateral    inguinal   I & D EXTREMITY Right 07/30/2018   Procedure: DEBRIDEMENT OF THE COMMON EXTENSOR ORIGIN OF ELBOW;  Surgeon: Corky Mull, MD;  Location: [place];  Service: Orthopedics;  Laterality: Right;  1.45 BIOMET JUGGERKNOT ANCHORS   KNEE ARTHROSCOPY WITH MEDIAL MENISECTOMY Left 05/13/2019   Procedure: KNEE ARTHROSCOPY WITH DEBRIDEMENT AND  REPAIR [place];  Surgeon: Corky Mull, MD;  Location: [facility];  Service: Orthopedics;  Laterality: Left;   LEFT HEART CATH AND CORONARY ANGIOGRAPHY N/A 01/08/2017   Procedure: Left Heart Cath and Coronary Angiography;  Surgeon: Nelva Bush, MD;  Location: [place] [facility];  Service: Cardiovascular;  Laterality: N/A;   MANDIBLE FRACTURE SURGERY     ROBOT ASSISTED INGUINAL HERNIA REPAIR Bilateral 04/17/2018   Procedure: ROBOT ASSISTED INGUINAL HERNIA REPAIR;  Surgeon: Jules Husbands, MD;  Location: [facility];  Service: General;  Laterality: Bilateral;     Allergies: Allergies as of 01/04/2022 - Review Complete 12/21/2021  Allergen Reaction Noted   Bee pollen Other (See Comments) and Cough 04/08/2018   Short ragweed pollen ext Other (See Comments) 06/14/2021   Pollen extract      Medications: No outpatient medications have been marked as taking for the 01/04/22 encounter (Appointment) with Meade Maw, MD.    Social History: Social History   Tobacco Use   Smoking status: Never   Smokeless tobacco: Never  Vaping Use   Vaping Use: Never used  Substance Use Topics   Alcohol use: Yes    Alcohol/week: 14.0 standard drinks of alcohol    Types: 14 Cans of beer per week    Comment: 2-3 a day    Drug use: No    Family Medical History: Family History  Problem Relation Age of Onset   Arthritis Other        parents   Hypertension Mother    Thyroid disease Mother    Osteoporosis Mother    Syncope episode Mother    Hypertension Brother     Physical Examination: There were no vitals filed for this visit.  General: Patient is well developed, well nourished, calm, collected, and in no apparent distress. Attention to examination is appropriate.  Psychiatric: Patient is non-anxious.  Head:  Pupils equal, round, and reactive to light.  ENT:  Oral mucosa appears well hydrated.  Neck:   Supple.  Full range of motion with discomfort on rotation to the left  Respiratory: Patient is breathing without any difficulty.  Extremities: No edema.  Vascular: Palpable dorsal pedal pulses.  Skin:   On exposed skin, there are no abnormal skin lesions.  NEUROLOGICAL:     Awake, alert, oriented to person, place, and time.  Speech is clear and fluent. Fund of knowledge is appropriate.   Cranial Nerves: Pupils equal round and reactive to light.  Facial tone is symmetric.  Facial sensation is symmetric. Shoulder shrug is symmetric. Tongue protrusion is midline.  There is no pronator drift.    Strength: Side Biceps Triceps  Deltoid Interossei Grip Wrist Ext. Wrist Flex.  R '5 5 5 5 4 '$ 4+ 5  L '5 5 5 5 5 5 5   '$ Side Iliopsoas Quads Hamstring PF DF EHL  R '5 5 5 5 5 5  '$ L '5 5 5 5 5 5   '$ Reflexes are 1+ and symmetric at the biceps, triceps, brachioradialis, patella and achilles.   Hoffman's is absent.  Clonus is not present.  Toes are down-going.  Bilateral upper and lower extremity sensation is intact to light touch.    No evidence of dysmetria noted.  Gait is wide-based.       Medical Decision Making  Imaging: MRI C spine 11/29/21 C4-C5: Disc bulge with superimposed central protrusion. Marked canal stenosis. No foraminal stenosis.   C5-C6: Disc bulge with endplate osteophytes. Uncovertebral hypertrophy. Marked canal stenosis. Minor right  and moderate left foraminal stenosis.   C6-C7: Disc bulge with endplate osteophytes. Uncovertebral hypertrophy. Mild canal stenosis. No right foraminal stenosis. Mild left foraminal stenosis.   C7-T1:  No canal or foraminal stenosis.   IMPRESSION: Multilevel degenerative changes as detailed above. There is marked canal narrowing at C4-C5 and C5-C6 without abnormal cord signal.     Electronically Signed   By: Macy Mis M.D.   On: 11/30/2021 10:38  I have personally reviewed the images and agree with the above interpretation.  Assessment and Plan: Mr. Englert is a pleasant 51 y.o. male with cervical stenosis at C4-5 and C5-6.  He has symptoms of mild cervical myelopathy clinically.  He is currently undergoing physical therapy.  I discussed the options with him which include watchful waiting versus early surgical intervention given his diagnosis of myelopathy.  For now, he would like to watch his symptoms and we will see him back in clinic in follow-up.  We did discuss the warning signs that would warrant earlier follow-up.  I do think it is possible he will ultimately need a C4-6 anterior cervical discectomy and fusion.  We did discuss that and the  rationale behind consideration of surgery for cervical myelopathy, as other conservative measures have not been shown to address the dysfunction caused by cervical stenosis.  I discussed the planned procedure at length with the patient, including the risks, benefits, alternatives, and indications. The risks discussed include but are not limited to bleeding, infection, need for reoperation, spinal fluid leak, stroke, vision loss, anesthetic complication, coma, paralysis, and even death. We also discussed the possibility of post-operative dysphagia, vocal cord paralysis, and the risk of adjacent segment disease in the future. I also described in detail that improvement was not guaranteed.  The patient expressed understanding of these risks, and asked that we proceed with surgery. I described the surgery in layman's terms, and gave ample opportunity for questions, which were answered to the best of my ability.    I spent a total of 30 minutes in face-to-face and non-face-to-face activities related to this patient's care today.  Thank you for involving me in the care of this patient.   This note was partially dictated using voice recognition software, so please excuse any errors that were not corrected.   Mendel Binsfeld K. Izora Ribas MD, [facility] Neurosurgery

## 2022-01-04 ENCOUNTER — Encounter: Payer: Self-pay | Admitting: Neurosurgery

## 2022-01-04 ENCOUNTER — Ambulatory Visit: Payer: Medicare Other | Admitting: Neurosurgery

## 2022-01-04 VITALS — BP 145/108 | HR 56 | Ht 67.0 in | Wt 203.4 lb

## 2022-01-04 DIAGNOSIS — G959 Disease of spinal cord, unspecified: Secondary | ICD-10-CM

## 2022-01-04 DIAGNOSIS — M4802 Spinal stenosis, cervical region: Secondary | ICD-10-CM

## 2022-01-08 ENCOUNTER — Other Ambulatory Visit: Payer: Self-pay

## 2022-01-08 ENCOUNTER — Encounter: Payer: Self-pay | Admitting: Family Medicine

## 2022-01-08 DIAGNOSIS — M4802 Spinal stenosis, cervical region: Secondary | ICD-10-CM

## 2022-01-16 ENCOUNTER — Other Ambulatory Visit: Payer: Self-pay

## 2022-01-16 DIAGNOSIS — I10 Essential (primary) hypertension: Secondary | ICD-10-CM

## 2022-01-16 MED ORDER — LOSARTAN POTASSIUM 100 MG PO TABS: ORAL_TABLET | ORAL | 3 refills | Status: DC

## 2022-02-19 ENCOUNTER — Encounter: Payer: Self-pay | Admitting: Family Medicine

## 2022-02-19 ENCOUNTER — Ambulatory Visit (INDEPENDENT_AMBULATORY_CARE_PROVIDER_SITE_OTHER): Payer: Medicare Other | Admitting: Family Medicine

## 2022-02-19 VITALS — BP 112/70 | HR 57 | Temp 98.6°F | Ht 67.0 in | Wt 206.2 lb

## 2022-02-19 DIAGNOSIS — R1011 Right upper quadrant pain: Secondary | ICD-10-CM

## 2022-02-19 DIAGNOSIS — K219 Gastro-esophageal reflux disease without esophagitis: Secondary | ICD-10-CM | POA: Diagnosis not present

## 2022-02-19 DIAGNOSIS — Z23 Encounter for immunization: Secondary | ICD-10-CM | POA: Diagnosis not present

## 2022-02-19 DIAGNOSIS — M5412 Radiculopathy, cervical region: Secondary | ICD-10-CM | POA: Diagnosis not present

## 2022-02-19 NOTE — Assessment & Plan Note (Signed)
The patient will try reducing his Protonix 40 mg to once daily.  If he has recurrence of his reflux he will go back to taking Protonix 40 mg twice daily.  Discussed risk of inadequate absorption, bone weakening, and kidney dysfunction while taking this medication chronically.  Discussed the benefit of the medicine likely outweighs the risk given his chronic reflux issues.

## 2022-02-19 NOTE — Assessment & Plan Note (Signed)
Chronic ongoing issue.  Prior work-up was negative.  Potentially this could be a musculoskeletal type of discomfort.  He will monitor.

## 2022-02-19 NOTE — Patient Instructions (Signed)
Nice to see you.  Please try reducing the protonix to once a day and see if you do ok on that. If you need to you can go back to twice a day on the protonix.

## 2022-02-19 NOTE — Progress Notes (Signed)
[name], MD Phone: [phone]  Wesley Bridges is a 51 y.o. male who presents today for f/u.  GERD:   Reflux symptoms: none   Abd pain: notes chronic RUQ pain    EGD: 12/21/21, irregular z-line  Medication: protonix 40 mg BID Patient notes a history of hiatal hernia.  He wonders if getting this fixed to resolve his reflux.  Chronic right upper quadrant pain: Patient has had intermittent right upper quadrant pain for quite some time.  He has a sharp pain along his right rib line anteriorly.  When it occurs it is constant in the last 3 to 4 days.  No nausea or vomiting.  No diarrhea.  No shooting or radiating pain.  Patient had a CT scan in December 2022 that did not reveal a cause for this discomfort.  He reports having seen GI for this in the past.  Cervical radiculopathy: Patient reports he saw neurosurgery and had an MRI that revealed some degenerative changes.  He notes the surgeon here recommended he have surgery in the near future though he has scheduled a second opinion visit with [facility] neurosurgery.  He notes intermittent issues with numbness particularly in his right arm though occasionally occurring in his left arm.  He notes this most often occurs at night while he is asleep.   Social History   Tobacco Use  Smoking Status Never  Smokeless Tobacco Never    Current Outpatient Medications on File Prior to Visit  Medication Sig Dispense Refill   aspirin EC 81 MG tablet Take 81 mg by mouth daily.     atorvastatin (LIPITOR) 80 MG tablet Take 1 tablet (80 mg total) by mouth daily. 90 tablet 3   fluticasone (FLONASE) 50 MCG/ACT nasal spray SPRAY 2 SPRAYS INTO EACH NOSTRIL EVERY DAY 48 mL 3   gabapentin (NEURONTIN) 300 MG capsule Take 300 mg by mouth 3 (three) times daily.     ibuprofen (ADVIL) 800 MG tablet Take 800 mg by mouth 3 (three) times daily as needed for moderate pain.      loratadine (CLARITIN) 10 MG tablet Take 1 tablet (10 mg total) by mouth daily. (Patient  taking differently: Take 20 mg by mouth daily.) 30 tablet 11   losartan (COZAAR) 100 MG tablet TAKE 1 TABLET DAILY        (DISCONTINUE LISINOPRIL    PRESCRIPTION) 90 tablet 3   meloxicam (MOBIC) 15 MG tablet Take 15 mg by mouth daily.     Multiple Vitamin (MULTIVITAMIN WITH MINERALS) TABS tablet Take 1 tablet by mouth at bedtime.     nebivolol (BYSTOLIC) 5 MG tablet Take 1 tablet (5 mg total) by mouth daily. 90 tablet 3   nitroGLYCERIN (NITROSTAT) 0.4 MG SL tablet Place 1 tablet (0.4 mg total) under the tongue every 5 (five) minutes as needed for chest pain. 25 tablet 0   pantoprazole (PROTONIX) 40 MG tablet TAKE 1 TABLET TWICE A DAY BEFORE MEALS 60 tablet 11   topiramate (TOPAMAX) 100 MG tablet Take 1 tablet (100 mg total) by mouth 2 (two) times daily. 180 tablet 1   Sod Picosulfate-Mag Ox-Cit Acd (CLENPIQ) 10-3.5-12 MG-GM -GM/160ML SOLN Take 320 mLs by mouth as directed. (Patient not taking: Reported on 02/19/2022) 320 mL 0   No current facility-administered medications on file prior to visit.     ROS see history of present illness  Objective  Physical Exam Vitals:   02/19/22 1039  BP: 112/70  Pulse: (!) 57  Temp: 98.6 F (37  C)  SpO2: 99%    BP Readings from Last 3 Encounters:  02/19/22 112/70  01/04/22 (!) 145/108  12/21/21 119/88   Wt Readings from Last 3 Encounters:  02/19/22 206 lb 3.2 oz (93.5 kg)  01/04/22 203 lb 6.4 oz (92.3 kg)  12/21/21 202 lb (91.6 kg)    Physical Exam Constitutional:      General: He is not in acute distress.    Appearance: He is not diaphoretic.  Cardiovascular:     Rate and Rhythm: Normal rate and regular rhythm.     Heart sounds: Normal heart sounds.  Pulmonary:     Effort: Pulmonary effort is normal.     Breath sounds: Normal breath sounds.  Abdominal:     General: Bowel sounds are normal. There is no distension.     Palpations: Abdomen is soft.     Tenderness: There is no abdominal tenderness.  Skin:    General: Skin is warm and  dry.  Neurological:     Mental Status: He is alert.     Comments: 5/5 strength in bilateral biceps, triceps, grip, quads, hamstrings, plantar and dorsiflexion, sensation to light touch intact in bilateral UE and LE      Assessment/Plan: Please see individual problem list.  Problem List Items Addressed This Visit     Esophageal reflux (Chronic)    The patient will try reducing his Protonix 40 mg to once daily.  If he has recurrence of his reflux he will go back to taking Protonix 40 mg twice daily.  Discussed risk of inadequate absorption, bone weakening, and kidney dysfunction while taking this medication chronically.  Discussed the benefit of the medicine likely outweighs the risk given his chronic reflux issues.      Cervical radiculopathy    He will see neurosurgery as planned.  He will seek medical attention immediately for any worsening symptoms.      Right upper quadrant pain    Chronic ongoing issue.  Prior work-up was negative.  Potentially this could be a musculoskeletal type of discomfort.  He will monitor.      Other Visit Diagnoses     Need for immunization against influenza    -  Primary   Relevant Orders   Flu Vaccine QUAD 25moIM (Fluarix, Fluzone & Alfiuria Quad PF) (Completed)      Return in about 6 months (around 08/22/2022).   [place]ommi Rumps MD [place]

## 2022-02-19 NOTE — Assessment & Plan Note (Signed)
He will see neurosurgery as planned.  He will seek medical attention immediately for any worsening symptoms.

## 2022-02-22 ENCOUNTER — Telehealth: Payer: Medicare Other | Admitting: Physician Assistant

## 2022-02-22 ENCOUNTER — Ambulatory Visit: Payer: Medicare Other | Admitting: Neurosurgery

## 2022-02-22 ENCOUNTER — Encounter: Payer: Self-pay | Admitting: Family Medicine

## 2022-02-22 DIAGNOSIS — U071 COVID-19: Secondary | ICD-10-CM | POA: Diagnosis not present

## 2022-02-22 MED ORDER — ALBUTEROL SULFATE HFA 108 (90 BASE) MCG/ACT IN AERS: 2.0000 | INHALATION_SPRAY | Freq: Four times a day (QID) | RESPIRATORY_TRACT | 0 refills | Status: DC | PRN

## 2022-02-22 MED ORDER — NIRMATRELVIR/RITONAVIR (PAXLOVID)TABLET: 3.0000 | ORAL_TABLET | Freq: Two times a day (BID) | ORAL | 0 refills | Status: AC

## 2022-02-22 MED ORDER — BENZONATATE 100 MG PO CAPS: 100.0000 mg | ORAL_CAPSULE | Freq: Three times a day (TID) | ORAL | 0 refills | Status: DC | PRN

## 2022-02-22 NOTE — Patient Instructions (Signed)
[name], thank you for joining Leeanne Rio, PA-C for today's virtual visit.  While this provider is not your primary care provider (PCP), if your PCP is located in our provider database this encounter information will be shared with them immediately following your visit.  Consent: (Patient) Wesley Bridges provided verbal consent for this virtual visit at the beginning of the encounter.  Current Medications:  Current Outpatient Medications:    aspirin EC 81 MG tablet, Take 81 mg by mouth daily., Disp: , Rfl:    atorvastatin (LIPITOR) 80 MG tablet, Take 1 tablet (80 mg total) by mouth daily., Disp: 90 tablet, Rfl: 3   fluticasone (FLONASE) 50 MCG/ACT nasal spray, SPRAY 2 SPRAYS INTO EACH NOSTRIL EVERY DAY, Disp: 48 mL, Rfl: 3   gabapentin (NEURONTIN) 300 MG capsule, Take 300 mg by mouth 3 (three) times daily., Disp: , Rfl:    ibuprofen (ADVIL) 800 MG tablet, Take 800 mg by mouth 3 (three) times daily as needed for moderate pain. , Disp: , Rfl:    loratadine (CLARITIN) 10 MG tablet, Take 1 tablet (10 mg total) by mouth daily. (Patient taking differently: Take 20 mg by mouth daily.), Disp: 30 tablet, Rfl: 11   losartan (COZAAR) 100 MG tablet, TAKE 1 TABLET DAILY        (DISCONTINUE LISINOPRIL    PRESCRIPTION), Disp: 90 tablet, Rfl: 3   meloxicam (MOBIC) 15 MG tablet, Take 15 mg by mouth daily., Disp: , Rfl:    Multiple Vitamin (MULTIVITAMIN WITH MINERALS) TABS tablet, Take 1 tablet by mouth at bedtime., Disp: , Rfl:    nebivolol (BYSTOLIC) 5 MG tablet, Take 1 tablet (5 mg total) by mouth daily., Disp: 90 tablet, Rfl: 3   nitroGLYCERIN (NITROSTAT) 0.4 MG SL tablet, Place 1 tablet (0.4 mg total) under the tongue every 5 (five) minutes as needed for chest pain., Disp: 25 tablet, Rfl: 0   pantoprazole (PROTONIX) 40 MG tablet, TAKE 1 TABLET TWICE A DAY BEFORE MEALS, Disp: 60 tablet, Rfl: 11   Sod Picosulfate-Mag Ox-Cit Acd (CLENPIQ) 10-3.5-12 MG-GM -GM/160ML SOLN, Take 320  mLs by mouth as directed. (Patient not taking: Reported on 02/19/2022), Disp: 320 mL, Rfl: 0   topiramate (TOPAMAX) 100 MG tablet, Take 1 tablet (100 mg total) by mouth 2 (two) times daily., Disp: 180 tablet, Rfl: 1   Medications ordered in this encounter:  No orders of the defined types were placed in this encounter.   *If you need refills on other medications prior to your next appointment, please contact your pharmacy*  Follow-Up: Call back or seek an in-person evaluation if the symptoms worsen or if the condition fails to improve as anticipated.  Other Instructions Please keep well-hydrated and get plenty of rest. Start a saline nasal rinse to flush out your nasal passages. You can use plain Mucinex to help thin congestion. If you have a humidifier, running in the bedroom at night. I want you to start OTC vitamin D3 1000 units daily, vitamin C 1000 mg daily, and a zinc supplement. Please take prescribed medications as directed.  You have been enrolled in a MyChart symptom monitoring program. Please answer these questions daily so we can keep track of how you are doing.  You were to quarantine for 5 days from onset of your symptoms.  After day 5, if you have had no fever and you are feeling better, you can end quarantine but need to mask for an additional 5 days. After day 5 if you  have a fever or are having significant symptoms, please quarantine for full 10 days.  If you note any worsening of symptoms, any significant shortness of breath or any chest pain, please seek ER evaluation ASAP.  Please do not delay care!  COVID-19: What to Do if You Are Sick If you test positive and are an older adult or someone who is at high risk of getting very sick from COVID-19, treatment may be available. Contact a healthcare provider right away after a positive test to determine if you are eligible, even if your symptoms are mild right now. You can also visit a Test to Treat location and, if eligible,  receive a prescription from a provider. Don't delay: Treatment must be started within the first few days to be effective. If you have a fever, cough, or other symptoms, you might have COVID-19. Most people have mild illness and are able to recover at home. If you are sick: Keep track of your symptoms. If you have an emergency warning sign (including trouble breathing), call 911. Steps to help prevent the spread of COVID-19 if you are sick If you are sick with COVID-19 or think you might have COVID-19, follow the steps below to care for yourself and to help protect other people in your home and community. Stay home except to get medical care Stay home. Most people with COVID-19 have mild illness and can recover at home without medical care. Do not leave your home, except to get medical care. Do not visit public areas and do not go to places where you are unable to wear a mask. Take care of yourself. Get rest and stay hydrated. Take over-the-counter medicines, such as acetaminophen, to help you feel better. Stay in touch with your doctor. Call before you get medical care. Be sure to get care if you have trouble breathing, or have any other emergency warning signs, or if you think it is an emergency. Avoid public transportation, ride-sharing, or taxis if possible. Get tested If you have symptoms of COVID-19, get tested. While waiting for test results, stay away from others, including staying apart from those living in your household. Get tested as soon as possible after your symptoms start. Treatments may be available for people with COVID-19 who are at risk for becoming very sick. Don't delay: Treatment must be started early to be effective--some treatments must begin within 5 days of your first symptoms. Contact your healthcare provider right away if your test result is positive to determine if you are eligible. Self-tests are one of several options for testing for the virus that causes COVID-19 and may  be more convenient than laboratory-based tests and point-of-care tests. Ask your healthcare provider or your local health department if you need help interpreting your test results. You can visit your state, tribal, local, and territorial health department's website to look for the latest local information on testing sites. Separate yourself from other people As much as possible, stay in a specific room and away from other people and pets in your home. If possible, you should use a separate bathroom. If you need to be around other people or animals in or outside of the home, wear a well-fitting mask. Tell your close contacts that they may have been exposed to COVID-19. An infected person can spread COVID-19 starting 48 hours (or 2 days) before the person has any symptoms or tests positive. By letting your close contacts know they may have been exposed to COVID-19, you are helping to  protect everyone. See COVID-19 and Animals if you have questions about pets. If you are diagnosed with COVID-19, someone from the health department may call you. Answer the call to slow the spread. Monitor your symptoms Symptoms of COVID-19 include fever, cough, or other symptoms. Follow care instructions from your healthcare provider and local health department. Your local health authorities may give instructions on checking your symptoms and reporting information. When to seek emergency medical attention Look for emergency warning signs* for COVID-19. If someone is showing any of these signs, seek emergency medical care immediately: Trouble breathing Persistent pain or pressure in the chest New confusion Inability to wake or stay awake Pale, gray, or blue-colored skin, lips, or nail beds, depending on skin tone *This list is not all possible symptoms. Please call your medical provider for any other symptoms that are severe or concerning to you. Call 911 or call ahead to your local emergency facility: Notify the  operator that you are seeking care for someone who has or may have COVID-19. Call ahead before visiting your doctor Call ahead. Many medical visits for routine care are being postponed or done by phone or telemedicine. If you have a medical appointment that cannot be postponed, call your doctor's office, and tell them you have or may have COVID-19. This will help the office protect themselves and other patients. If you are sick, wear a well-fitting mask You should wear a mask if you must be around other people or animals, including pets (even at home). Wear a mask with the best fit, protection, and comfort for you. You don't need to wear the mask if you are alone. If you can't put on a mask (because of trouble breathing, for example), cover your coughs and sneezes in some other way. Try to stay at least 6 feet away from other people. This will help protect the people around you. Masks should not be placed on young children under age 52 years, anyone who has trouble breathing, or anyone who is not able to remove the mask without help. Cover your coughs and sneezes Cover your mouth and nose with a tissue when you cough or sneeze. Throw away used tissues in a lined trash can. Immediately wash your hands with soap and water for at least 20 seconds. If soap and water are not available, clean your hands with an alcohol-based hand sanitizer that contains at least 60% alcohol. Clean your hands often Wash your hands often with soap and water for at least 20 seconds. This is especially important after blowing your nose, coughing, or sneezing; going to the bathroom; and before eating or preparing food. Use hand sanitizer if soap and water are not available. Use an alcohol-based hand sanitizer with at least 60% alcohol, covering all surfaces of your hands and rubbing them together until they feel dry. Soap and water are the best option, especially if hands are visibly dirty. Avoid touching your eyes, nose, and  mouth with unwashed hands. Handwashing Tips Avoid sharing personal household items Do not share dishes, drinking glasses, cups, eating utensils, towels, or bedding with other people in your home. Wash these items thoroughly after using them with soap and water or put in the dishwasher. Clean surfaces in your home regularly Clean and disinfect high-touch surfaces (for example, doorknobs, tables, handles, light switches, and countertops) in your "sick room" and bathroom. In shared spaces, you should clean and disinfect surfaces and items after each use by the person who is ill. If you are sick  and cannot clean, a caregiver or other person should only clean and disinfect the area around you (such as your bedroom and bathroom) on an as needed basis. Your caregiver/other person should wait as long as possible (at least several hours) and wear a mask before entering, cleaning, and disinfecting shared spaces that you use. Clean and disinfect areas that may have blood, stool, or body fluids on them. Use household cleaners and disinfectants. Clean visible dirty surfaces with household cleaners containing soap or detergent. Then, use a household disinfectant. Use a product from [employer] List N: Disinfectants for Coronavirus (KHVFM-73). Be sure to follow the instructions on the label to ensure safe and effective use of the product. Many products recommend keeping the surface wet with a disinfectant for a certain period of time (look at "contact time" on the product label). You may also need to wear personal protective equipment, such as gloves, depending on the directions on the product label. Immediately after disinfecting, wash your hands with soap and water for 20 seconds. For completed guidance on cleaning and disinfecting your home, visit Complete Disinfection Guidance. Take steps to improve ventilation at home Improve ventilation (air flow) at home to help prevent from spreading COVID-19 to other people in  your household. Clear out COVID-19 virus particles in the air by opening windows, using air filters, and turning on fans in your home. Use this interactive tool to learn how to improve air flow in your home. When you can be around others after being sick with COVID-19 Deciding when you can be around others is different for different situations. Find out when you can safely end home isolation. For any additional questions about your care, contact your healthcare provider or state or local health department. 09/13/2020 Content source: [facility] ([facility]), Division of Viral Diseases This information is not intended to replace advice given to you by your health care provider. Make sure you discuss any questions you have with your health care provider. Document Revised: 10/27/2020 Document Reviewed: 10/27/2020 Elsevier Patient Education  2022 [employer].      If you have been instructed to have an in-person evaluation today at a local Urgent Care facility, please use the link below. It will take you to a list of all of our available [facility], including address, phone number and hours of operation. Please do not delay care.  [facility]  If you or a family member do not have a primary care provider, use the link below to schedule a visit and establish care. When you choose a [facility] primary care physician or advanced practice provider, you gain a long-term partner in health. Find a Primary Care Provider  Learn more about [facility]'s in-office and virtual care options: [facility]

## 2022-02-22 NOTE — Progress Notes (Signed)
Virtual Visit Consent   [place], you are scheduled for a virtual visit with a [facility] provider today. Just as with appointments in the office, your consent must be obtained to participate. Your consent will be active for this visit and any virtual visit you may have with one of our providers in the next 365 days. If you have a MyChart account, a copy of this consent can be sent to you electronically.  As this is a virtual visit, video technology does not allow for your provider to perform a traditional examination. This may limit your provider's ability to fully assess your condition. If your provider identifies any concerns that need to be evaluated in person or the need to arrange testing (such as labs, EKG, etc.), we will make arrangements to do so. Although advances in technology are sophisticated, we cannot ensure that it will always work on either your end or our end. If the connection with a video visit is poor, the visit may have to be switched to a telephone visit. With either a video or telephone visit, we are not always able to ensure that we have a secure connection.  By engaging in this virtual visit, you consent to the provision of healthcare and authorize for your insurance to be billed (if applicable) for the services provided during this visit. Depending on your insurance coverage, you may receive a charge related to this service.  I need to obtain your verbal consent now. Are you willing to proceed with your visit today? Wesley Bridges has provided verbal consent on 02/22/2022 for a virtual visit (video or telephone). Leeanne Rio, [place]  Date: 02/22/2022 3:10 PM  Virtual Visit via Video Note   I, Leeanne Rio, connected with  Wesley Bridges  ([id number], 1971/04/30) on 02/22/22 at  3:00 PM EDT by a video-enabled telemedicine application and verified that I am speaking with the correct person using two identifiers.  Location: Patient:  Virtual Visit Location Patient: Home Provider: Virtual Visit Location Provider: Home Office   I discussed the limitations of evaluation and management by telemedicine and the availability of in person appointments. The patient expressed understanding and agreed to proceed.    History of Present Illness: Wesley Bridges is a 51 y.o. who identifies as a male who was assigned male at birth, and is being seen today for COVID-19. Endorses with a mild scratchy throat.  As of today he is noting cough and congestion, more substantial sore throat, headache, chest tightness and fatigue.  Denies fever, chest pain or overt shortness of breath.  Tested positive for COVID with home test this morning.  Notes his daughter had COVID last week.  Son also now with symptoms.  He has noted a little bit of loose stool this afternoon, but no other GI symptoms.  Has had COVID before, but at the very beginning of the pandemic  HPI: HPI  Problems:  Patient Active Problem List   Diagnosis Date Noted   Cervical radiculopathy 02/19/2022   Right upper quadrant pain 02/19/2022   Hand pain 11/08/2021   Fingernail abnormalities 11/08/2021   Skin lesion 08/09/2021   Low back pain 05/12/2021   Lipoma of left lower extremity 04/26/2021   Sciatica 04/26/2021   Inflammatory pain 04/26/2021   Lumbar radiculopathy 04/26/2021   De Quervain's tenosynovitis, bilateral 11/02/2020   Right knee pain 11/02/2020   Tingling 04/18/2020   Obesity (BMI 30.0-34.9) 10/07/2019   Onychomycosis 10/07/2019   Loose body of  right elbow 08/07/2019   Complex tear of medial meniscus of left knee as current injury 03/23/2019   Degenerative joint disease of elbow, right 12/08/2018   Chronic tension-type headache, intractable 12/01/2018   OSA (obstructive sleep apnea) 11/05/2018   Prediabetes 11/05/2018   Chondromalacia of right patella 10/23/2018   Arthritis 07/22/2018   Sweating abnormality 07/22/2018   Hypersomnia 07/22/2018   Right  lateral epicondylitis 07/14/2018   Encounter for long-term (current) use of high-risk medication 07/10/2018   Headache disorder 04/10/2018   Elevated urine levels of catecholamines 04/03/2018   Right elbow pain 04/03/2018   Right forearm numbness 03/05/2018   Low TSH level 02/18/2017   Major depression, single episode 09/16/2016   Palpitations 09/16/2016   Inguinal hernia 09/16/2016   Fibrosis of subtalar joint, right 07/16/2016   Sprain of right ankle 05/24/2016   Joint pain 04/18/2016   Enlarged prostate 03/22/2016   Lightheadedness 12/15/2015   Vasovagal syncope 12/06/2015   Injury of toe on left foot 10/31/2015   Right ankle swelling 10/31/2015   Esophageal reflux 10/31/2015   Hyperlipidemia 04/13/2015   Essential hypertension 03/18/2015   Chest pain 03/18/2015    Allergies:  Allergies  Allergen Reactions   Bee Pollen Other (See Comments) and Cough    Cough runny eyes   Short Ragweed Pollen Ext Other (See Comments)   Pollen Extract    Medications:  Current Outpatient Medications:    albuterol (VENTOLIN HFA) 108 (90 Base) MCG/ACT inhaler, Inhale 2 puffs into the lungs every 6 (six) hours as needed for wheezing or shortness of breath., Disp: 8 g, Rfl: 0   benzonatate (TESSALON) 100 MG capsule, Take 1 capsule (100 mg total) by mouth 3 (three) times daily as needed for cough., Disp: 30 capsule, Rfl: 0   nirmatrelvir/ritonavir EUA (PAXLOVID) 20 x 150 MG & 10 x '100MG'$  TABS, Take 3 tablets by mouth 2 (two) times daily for 5 days. (Take nirmatrelvir 150 mg two tablets twice daily for 5 days and ritonavir 100 mg one tablet twice daily for 5 days) Patient GFR is 65.8, Disp: 30 tablet, Rfl: 0   aspirin EC 81 MG tablet, Take 81 mg by mouth daily., Disp: , Rfl:    atorvastatin (LIPITOR) 80 MG tablet, Take 1 tablet (80 mg total) by mouth daily., Disp: 90 tablet, Rfl: 3   fluticasone (FLONASE) 50 MCG/ACT nasal spray, SPRAY 2 SPRAYS INTO EACH NOSTRIL EVERY DAY, Disp: 48 mL, Rfl: 3    gabapentin (NEURONTIN) 300 MG capsule, Take 300 mg by mouth 3 (three) times daily., Disp: , Rfl:    ibuprofen (ADVIL) 800 MG tablet, Take 800 mg by mouth 3 (three) times daily as needed for moderate pain. , Disp: , Rfl:    loratadine (CLARITIN) 10 MG tablet, Take 1 tablet (10 mg total) by mouth daily. (Patient taking differently: Take 20 mg by mouth daily.), Disp: 30 tablet, Rfl: 11   losartan (COZAAR) 100 MG tablet, TAKE 1 TABLET DAILY        (DISCONTINUE LISINOPRIL    PRESCRIPTION), Disp: 90 tablet, Rfl: 3   meloxicam (MOBIC) 15 MG tablet, Take 15 mg by mouth daily., Disp: , Rfl:    Multiple Vitamin (MULTIVITAMIN WITH MINERALS) TABS tablet, Take 1 tablet by mouth at bedtime., Disp: , Rfl:    nebivolol (BYSTOLIC) 5 MG tablet, Take 1 tablet (5 mg total) by mouth daily., Disp: 90 tablet, Rfl: 3   nitroGLYCERIN (NITROSTAT) 0.4 MG SL tablet, Place 1 tablet (0.4 mg total) under the  tongue every 5 (five) minutes as needed for chest pain., Disp: 25 tablet, Rfl: 0   pantoprazole (PROTONIX) 40 MG tablet, TAKE 1 TABLET TWICE A DAY BEFORE MEALS, Disp: 60 tablet, Rfl: 11   Sod Picosulfate-Mag Ox-Cit Acd (CLENPIQ) 10-3.5-12 MG-GM -GM/160ML SOLN, Take 320 mLs by mouth as directed. (Patient not taking: Reported on 02/19/2022), Disp: 320 mL, Rfl: 0   topiramate (TOPAMAX) 100 MG tablet, Take 1 tablet (100 mg total) by mouth 2 (two) times daily., Disp: 180 tablet, Rfl: 1  Observations/Objective: Patient is well-developed, well-nourished in no acute distress.  Resting comfortably at home.  Head is normocephalic, atraumatic.  No labored breathing. Speech is clear and coherent with logical content.  Patient is alert and oriented at baseline.   Assessment and Plan: 1. COVID-19 - MyChart COVID-19 home monitoring program; Future - nirmatrelvir/ritonavir EUA (PAXLOVID) 20 x 150 MG & 10 x '100MG'$  TABS; Take 3 tablets by mouth 2 (two) times daily for 5 days. (Take nirmatrelvir 150 mg two tablets twice daily for 5 days and  ritonavir 100 mg one tablet twice daily for 5 days) Patient GFR is 65.8  Dispense: 30 tablet; Refill: 0 - benzonatate (TESSALON) 100 MG capsule; Take 1 capsule (100 mg total) by mouth 3 (three) times daily as needed for cough.  Dispense: 30 capsule; Refill: 0 - albuterol (VENTOLIN HFA) 108 (90 Base) MCG/ACT inhaler; Inhale 2 puffs into the lungs every 6 (six) hours as needed for wheezing or shortness of breath.  Dispense: 8 g; Refill: 0  Patient with multiple risk factors for complicated course of illness. Discussed risks/benefits of antiviral medications including most common potential ADRs. Patient voiced understanding and would like to proceed with antiviral medication. They are candidate for Paxlovid with recent normal BMP (cr/GFR).  We will have him hold atorvastatin while on antiviral and for 5 additional days given he has no history of known CVD (EMR shows normal cath 2018). Rx sent to pharmacy. Supportive measures, OTC medications and vitamin regimen reviewed.  Tessalon and albuterol per orders. Patient has been enrolled in a MyChart COVID symptom monitoring program. Wesley Bridges reviewed in detail. Strict ER precautions discussed with patient.    Follow Up Instructions: I discussed the assessment and treatment plan with the patient. The patient was provided an opportunity to ask questions and all were answered. The patient agreed with the plan and demonstrated an understanding of the instructions.  A copy of instructions were sent to the patient via MyChart unless otherwise noted below.   The patient was advised to call back or seek an in-person evaluation if the symptoms worsen or if the condition fails to improve as anticipated.  Time:  I spent 10 minutes with the patient via telehealth technology discussing the above problems/concerns.    Leeanne Rio, PA-C

## 2022-02-22 NOTE — Telephone Encounter (Signed)
I spoke with patient & let him know that Dr. Caryl Bis was off today ut we actually did not have an provider availability. I suggested that since sx not severe that he di a the mychart urgent care [facility]. Patient was agreeable & given directions on how to navigate there to scheduled.

## 2022-03-01 ENCOUNTER — Telehealth: Payer: Self-pay

## 2022-03-01 NOTE — Telephone Encounter (Signed)
Called patient in regards to the COVID-19 questionnaire. No answer, left VM to call back.

## 2022-04-09 ENCOUNTER — Encounter: Payer: Self-pay | Admitting: Family Medicine

## 2022-04-09 MED ORDER — TOPIRAMATE 100 MG PO TABS: 100.0000 mg | ORAL_TABLET | Freq: Two times a day (BID) | ORAL | 3 refills | Status: DC

## 2022-06-26 ENCOUNTER — Other Ambulatory Visit: Payer: Self-pay

## 2022-06-26 DIAGNOSIS — E785 Hyperlipidemia, unspecified: Secondary | ICD-10-CM

## 2022-06-26 MED ORDER — ATORVASTATIN CALCIUM 80 MG PO TABS: 80.0000 mg | ORAL_TABLET | Freq: Every day | ORAL | 3 refills | Status: DC

## 2022-07-16 ENCOUNTER — Other Ambulatory Visit: Payer: Self-pay

## 2022-07-16 DIAGNOSIS — T7840XS Allergy, unspecified, sequela: Secondary | ICD-10-CM

## 2022-07-16 MED ORDER — FLUTICASONE PROPIONATE 50 MCG/ACT NA SUSP: NASAL | 3 refills | Status: DC

## 2022-08-15 ENCOUNTER — Emergency Department
Admission: EM | Admit: 2022-08-15 | Discharge: 2022-08-15 | Disposition: A | Discharge status: Home / Self Care | Payer: Medicare Other | Attending: Emergency Medicine | Admitting: Emergency Medicine

## 2022-08-15 ENCOUNTER — Other Ambulatory Visit: Payer: Self-pay

## 2022-08-15 DIAGNOSIS — W278XXA Contact with other nonpowered hand tool, initial encounter: Secondary | ICD-10-CM | POA: Insufficient documentation

## 2022-08-15 DIAGNOSIS — S61217A Laceration without foreign body of left little finger without damage to nail, initial encounter: Secondary | ICD-10-CM | POA: Diagnosis not present

## 2022-08-15 MED ORDER — TETANUS-DIPHTH-ACELL PERTUSSIS 5-2.5-18.5 LF-MCG/0.5 IM SUSY: 0.5000 mL | PREFILLED_SYRINGE | Freq: Once | INTRAMUSCULAR | Status: DC

## 2022-08-15 MED ORDER — CEPHALEXIN 500 MG PO CAPS: 500.0000 mg | ORAL_CAPSULE | Freq: Four times a day (QID) | ORAL | 0 refills | Status: AC

## 2022-08-15 MED ORDER — LIDOCAINE HCL 1 % IJ SOLN
5.0000 mL | Freq: Once | INTRAMUSCULAR | Status: AC
  Administered 2022-08-15: 5 mL
  Filled 2022-08-15: qty 10

## 2022-08-15 NOTE — ED Notes (Signed)
ED Provider at bedside. 

## 2022-08-15 NOTE — Discharge Instructions (Addendum)
Take Keflex four times daily for the next seven days.

## 2022-08-15 NOTE — ED Triage Notes (Signed)
Pt sts he cut his L 5th finger with a saw. Pt sts he is up to date on his tetanus. Bleeding stopped a this time. PT has full ROM of his fingers.

## 2022-08-15 NOTE — ED Provider Notes (Signed)
[facility] Provider Note  Patient Contact: 9:11 PM (approximate)   History   Finger Injury (L 5th finger lac)   HPI  Wesley Bridges is a 52 y.o. male presents to the emergency department with a left fifth digit laceration along the volar aspect of the hand.  Laceration was approximately 2 cm and sustained with a saw accidentally.  Patient's tetanus status is up-to-date.      Physical Exam   Triage Vital Signs: ED Triage Vitals  Enc Vitals Group     BP 08/15/22 1806 133/88     Pulse Rate 08/15/22 1806 (!) 59     Resp 08/15/22 1806 18     Temp 08/15/22 1804 98.8 F (37.1 C)     Temp src --      SpO2 08/15/22 1806 100 %     Weight 08/15/22 1805 207 lb 3.7 oz (94 kg)     Height 08/15/22 1805 5' 7"$  (1.702 m)     Head Circumference --      Peak Flow --      Pain Score 08/15/22 1804 3     Pain Loc --      Pain Edu? --      Excl. in [place]? --     Most recent vital signs: Vitals:   08/15/22 1804 08/15/22 1806  BP:  133/88  Pulse:  (!) 59  Resp:  18  Temp: 98.8 F (37.1 C)   SpO2:  100%     General: Alert and in no acute distress. Eyes:  PERRL. EOMI. Head: No acute traumatic findings ENT:      Nose: No congestion/rhinnorhea.      Mouth/Throat: Mucous membranes are moist. Neck: No stridor. No cervical spine tenderness to palpation. Cardiovascular:  Good peripheral perfusion Respiratory: Normal respiratory effort without tachypnea or retractions. Lungs CTAB. Good air entry to the bases with no decreased or absent breath sounds. Gastrointestinal: Bowel sounds 4 quadrants. Soft and nontender to palpation. No guarding or rigidity. No palpable masses. No distention. No CVA tenderness. Musculoskeletal: Full range of motion to all extremities.  Neurologic:  No gross focal neurologic deficits are appreciated.  Skin: Patient has a 2 cm left fifth digit laceration. Other:   ED Results / Procedures / Treatments   Labs (all labs ordered  are listed, but only abnormal results are displayed) Labs Reviewed - No data to display       PROCEDURES:  Critical Care performed: No  ..Laceration Repair  Date/Time: 08/15/2022 9:12 PM  Performed by: Lannie Fields, PA-C Authorized by: Lannie Fields, PA-C   Consent:    Consent obtained:  Verbal   Risks discussed:  Infection and pain Universal protocol:    Procedure explained and questions answered to patient or proxy's satisfaction: yes     Patient identity confirmed:  Verbally with patient Anesthesia:    Anesthesia method:  Nerve block   Block anesthetic:  Lidocaine 1% w/o epi Laceration details:    Location:  Finger   Finger location:  L small finger   Length (cm):  2   Depth (mm):  5 Pre-procedure details:    Preparation:  Patient was prepped and draped in usual sterile fashion Exploration:    Limited defect created (wound extended): no     Contaminated: yes   Treatment:    Area cleansed with:  Povidone-iodine   Amount of cleaning:  Extensive   Irrigation solution:  Sterile saline   Irrigation volume:  500   Visualized foreign bodies/material removed: no     Debridement:  None Skin repair:    Repair method:  Sutures   Suture size:  4-0   Suture technique:  Running locked   Number of sutures:  5 Approximation:    Approximation:  Close Repair type:    Repair type:  Simple Post-procedure details:    Dressing:  Non-adherent dressing    MEDICATIONS ORDERED IN ED: Medications  lidocaine (XYLOCAINE) 1 % (with pres) injection 5 mL (5 mLs Infiltration Given 08/15/22 2031)     IMPRESSION / MDM / ASSESSMENT AND PLAN / ED COURSE  I reviewed the triage vital signs and the nursing notes.                              Assessment and plan Hand laceration 52 year old male presents to the emergency department with a 2 cm left fifth digit hand laceration.  Vital signs reassuring at triage.  On exam, patient alert, active and nontoxic-appearing.  Laceration  was repaired in the emergency department without complication dressing was applied and patient education regarding wound care was given.  Patient was discharged with Keflex.  Return precautions were given to return with new or worsening symptoms.   FINAL CLINICAL IMPRESSION(S) / ED DIAGNOSES   Final diagnoses:  Laceration of left little finger without foreign body without damage to nail, initial encounter     Rx / DC Orders   ED Discharge Orders          Ordered    cephALEXin (KEFLEX) 500 MG capsule  4 times daily        08/15/22 2054             Note:  This document was prepared using Dragon voice recognition software and may include unintentional dictation errors.   Karren Cobble 08/15/22 2115    Blake Divine, MD 08/15/22 2325

## 2022-08-15 NOTE — ED Notes (Signed)
Pt verbalized understanding of DC instructions. Signing pad did not work.

## 2022-08-22 ENCOUNTER — Other Ambulatory Visit: Payer: Self-pay

## 2022-08-22 ENCOUNTER — Ambulatory Visit: Payer: Medicare Other | Admitting: Family Medicine

## 2022-08-22 DIAGNOSIS — I10 Essential (primary) hypertension: Secondary | ICD-10-CM

## 2022-08-22 MED ORDER — NEBIVOLOL HCL 5 MG PO TABS: 5.0000 mg | ORAL_TABLET | Freq: Every day | ORAL | 3 refills | Status: DC

## 2022-08-31 ENCOUNTER — Telehealth: Payer: Medicare Other | Admitting: Nurse Practitioner

## 2022-08-31 DIAGNOSIS — H5712 Ocular pain, left eye: Secondary | ICD-10-CM | POA: Diagnosis not present

## 2022-08-31 NOTE — Progress Notes (Signed)
Virtual Visit Consent   [place], you are scheduled for a virtual visit with a [facility] provider today. Just as with appointments in the office, your consent must be obtained to participate. Your consent will be active for this visit and any virtual visit you may have with one of our providers in the next 365 days. If you have a MyChart account, a copy of this consent can be sent to you electronically.  As this is a virtual visit, video technology does not allow for your provider to perform a traditional examination. This may limit your provider's ability to fully assess your condition. If your provider identifies any concerns that need to be evaluated in person or the need to arrange testing (such as labs, EKG, etc.), we will make arrangements to do so. Although advances in technology are sophisticated, we cannot ensure that it will always work on either your end or our end. If the connection with a video visit is poor, the visit may have to be switched to a telephone visit. With either a video or telephone visit, we are not always able to ensure that we have a secure connection.  By engaging in this virtual visit, you consent to the provision of healthcare and authorize for your insurance to be billed (if applicable) for the services provided during this visit. Depending on your insurance coverage, you may receive a charge related to this service.  I need to obtain your verbal consent now. Are you willing to proceed with your visit today? Wesley Bridges has provided verbal consent on 08/31/2022 for a virtual visit (video or telephone). [name], FNP  Date: 08/31/2022 10:08 AM  Virtual Visit via Video Note   I, [name], connected with  Wesley Bridges  ([id number], 1971/01/15) on 08/31/22 at 10:15 AM EST by a video-enabled telemedicine application and verified that I am speaking with the correct person using two identifiers.  Location: Patient: Virtual Visit  Location Patient: Home Provider: Virtual Visit Location Provider: Home Office   I discussed the limitations of evaluation and management by telemedicine and the availability of in person appointments. The patient expressed understanding and agreed to proceed.    History of Present Illness: Wesley Bridges is a 52 y.o. who identifies as a male who was assigned male at birth, and is being seen today for left eye irritation & pain.  Symptom onset was yesterday  This morning his left eye was swollen, irritated to inner corner, more pain when he blinks He has a stabbing sensation spontaneously at time in eye   Feels the vision in that eye is blurred today, feels that is due to swelling.   Typically wears glasses  Denies any foreign objects in eyes that he knows  Works at home   Denies wearing sleep apnea mask at night    Problems:  Patient Active Problem List   Diagnosis Date Noted   Cervical radiculopathy 02/19/2022   Right upper quadrant pain 02/19/2022   Hand pain 11/08/2021   Fingernail abnormalities 11/08/2021   Skin lesion 08/09/2021   Low back pain 05/12/2021   Lipoma of left lower extremity 04/26/2021   Sciatica 04/26/2021   Inflammatory pain 04/26/2021   Lumbar radiculopathy 04/26/2021   De Quervain's tenosynovitis, bilateral 11/02/2020   Right knee pain 11/02/2020   Tingling 04/18/2020   Obesity (BMI 30.0-34.9) 10/07/2019   Onychomycosis 10/07/2019   Loose body of right elbow 08/07/2019   Complex tear of medial meniscus of left  knee as current injury 03/23/2019   Degenerative joint disease of elbow, right 12/08/2018   Chronic tension-type headache, intractable 12/01/2018   OSA (obstructive sleep apnea) 11/05/2018   Prediabetes 11/05/2018   Chondromalacia of right patella 10/23/2018   Arthritis 07/22/2018   Sweating abnormality 07/22/2018   Hypersomnia 07/22/2018   Right lateral epicondylitis 07/14/2018   Encounter for long-term (current) use of high-risk  medication 07/10/2018   Headache disorder 04/10/2018   Elevated urine levels of catecholamines 04/03/2018   Right elbow pain 04/03/2018   Right forearm numbness 03/05/2018   Low TSH level 02/18/2017   Major depression, single episode 09/16/2016   Palpitations 09/16/2016   Inguinal hernia 09/16/2016   Fibrosis of subtalar joint, right 07/16/2016   Sprain of right ankle 05/24/2016   Joint pain 04/18/2016   Enlarged prostate 03/22/2016   Lightheadedness 12/15/2015   Vasovagal syncope 12/06/2015   Injury of toe on left foot 10/31/2015   Right ankle swelling 10/31/2015   Esophageal reflux 10/31/2015   Hyperlipidemia 04/13/2015   Essential hypertension 03/18/2015   Chest pain 03/18/2015    Allergies:  Allergies  Allergen Reactions   Bee Pollen Other (See Comments) and Cough    Cough runny eyes   Short Ragweed Pollen Ext Other (See Comments)   Pollen Extract    Medications:  Current Outpatient Medications:    albuterol (VENTOLIN HFA) 108 (90 Base) MCG/ACT inhaler, Inhale 2 puffs into the lungs every 6 (six) hours as needed for wheezing or shortness of breath., Disp: 8 g, Rfl: 0   aspirin EC 81 MG tablet, Take 81 mg by mouth daily., Disp: , Rfl:    atorvastatin (LIPITOR) 80 MG tablet, Take 1 tablet (80 mg total) by mouth daily., Disp: 90 tablet, Rfl: 3   benzonatate (TESSALON) 100 MG capsule, Take 1 capsule (100 mg total) by mouth 3 (three) times daily as needed for cough., Disp: 30 capsule, Rfl: 0   fluticasone (FLONASE) 50 MCG/ACT nasal spray, SPRAY 2 SPRAYS INTO EACH NOSTRIL EVERY DAY, Disp: 48 mL, Rfl: 3   gabapentin (NEURONTIN) 300 MG capsule, Take 300 mg by mouth 3 (three) times daily., Disp: , Rfl:    ibuprofen (ADVIL) 800 MG tablet, Take 800 mg by mouth 3 (three) times daily as needed for moderate pain. , Disp: , Rfl:    loratadine (CLARITIN) 10 MG tablet, Take 1 tablet (10 mg total) by mouth daily. (Patient taking differently: Take 20 mg by mouth daily.), Disp: 30 tablet, Rfl:  11   losartan (COZAAR) 100 MG tablet, TAKE 1 TABLET DAILY        (DISCONTINUE LISINOPRIL    PRESCRIPTION), Disp: 90 tablet, Rfl: 3   meloxicam (MOBIC) 15 MG tablet, Take 15 mg by mouth daily., Disp: , Rfl:    Multiple Vitamin (MULTIVITAMIN WITH MINERALS) TABS tablet, Take 1 tablet by mouth at bedtime., Disp: , Rfl:    nebivolol (BYSTOLIC) 5 MG tablet, Take 1 tablet (5 mg total) by mouth daily., Disp: 90 tablet, Rfl: 3   nitroGLYCERIN (NITROSTAT) 0.4 MG SL tablet, Place 1 tablet (0.4 mg total) under the tongue every 5 (five) minutes as needed for chest pain., Disp: 25 tablet, Rfl: 0   pantoprazole (PROTONIX) 40 MG tablet, TAKE 1 TABLET TWICE A DAY BEFORE MEALS, Disp: 60 tablet, Rfl: 11   Sod Picosulfate-Mag Ox-Cit Acd (CLENPIQ) 10-3.5-12 MG-GM -GM/160ML SOLN, Take 320 mLs by mouth as directed. (Patient not taking: Reported on 02/19/2022), Disp: 320 mL, Rfl: 0   topiramate (TOPAMAX) 100  MG tablet, Take 1 tablet (100 mg total) by mouth 2 (two) times daily., Disp: 180 tablet, Rfl: 3  Observations/Objective: Patient is well-developed, well-nourished in no acute distress.  Resting comfortably  at home.  Head is normocephalic, atraumatic.  No labored breathing.  Speech is clear and coherent with logical content.  Patient is alert and oriented at baseline.  Conjunctiva clear bilaterally no evidence of stye/cut or lesion in or around eye  No active drainage   Assessment and Plan: 1. Left eye pain Due to symptoms of blurred vision pain and noted inner orbital swelling this am patient is advised to have in person evaluation     He will call his ophthalmologist and if unable to be seen today at that office he will proceed to UC as discussed    Follow Up Instructions: I discussed the assessment and treatment plan with the patient. The patient was provided an opportunity to ask questions and all were answered. The patient agreed with the plan and demonstrated an understanding of the instructions.  A  copy of instructions were sent to the patient via MyChart unless otherwise noted below.    The patient was advised to call back or seek an in-person evaluation if the symptoms worsen or if the condition fails to improve as anticipated.  Time:  I spent 15 minutes with the patient via telehealth technology discussing the above problems/concerns.    [name], FNP

## 2022-08-31 NOTE — Patient Instructions (Signed)
If you are unable to be seen at your Eye doctor today please visit one of the following Urgent Care locations   NEW!! [facility] [place] [facility] [place] Get Driving Directions [id number] [address] [place], [place] [facility] [place] [facility] [place] Get Driving Directions [id number] [place], [place] [address]   [place] [facility]) Get Driving Directions [id number] [address] [place], [place] [address]  [place] [facility] [place] ([place]) Get Driving Directions [id number] [address] [place],  [place]  [address]  [place] [facility] [place] [facility] - at [place] Get Driving Directions  [id number] [phone] [place],  [place] [address]   [place] [facility] Get Driving Directions [id number] [address] [place], [place], [place] [address]   [place] Get Driving Directions  [id number] [address] [place], [place] [address]   [facility] [place] Get Driving Directions [id number] [address] [place], [place] [address]  Your MyChart E-visit questionnaire answers were reviewed by a board certified advanced clinical practitioner to complete your personal care plan based on your specific symptoms.  Thank you for using e-Visits.

## 2022-09-03 ENCOUNTER — Ambulatory Visit (INDEPENDENT_AMBULATORY_CARE_PROVIDER_SITE_OTHER): Payer: Medicare Other | Admitting: Family Medicine

## 2022-09-03 ENCOUNTER — Encounter: Payer: Self-pay | Admitting: Family Medicine

## 2022-09-03 VITALS — BP 132/82 | HR 52 | Temp 98.0°F | Ht 67.0 in | Wt 203.2 lb

## 2022-09-03 DIAGNOSIS — R252 Cramp and spasm: Secondary | ICD-10-CM | POA: Diagnosis not present

## 2022-09-03 DIAGNOSIS — R7303 Prediabetes: Secondary | ICD-10-CM

## 2022-09-03 DIAGNOSIS — I10 Essential (primary) hypertension: Secondary | ICD-10-CM

## 2022-09-03 DIAGNOSIS — E785 Hyperlipidemia, unspecified: Secondary | ICD-10-CM | POA: Diagnosis not present

## 2022-09-03 DIAGNOSIS — R001 Bradycardia, unspecified: Secondary | ICD-10-CM

## 2022-09-03 DIAGNOSIS — F321 Major depressive disorder, single episode, moderate: Secondary | ICD-10-CM

## 2022-09-03 LAB — COMPREHENSIVE METABOLIC PANEL
ALT: 25 U/L (ref 0–53)
AST: 19 U/L (ref 0–37)
Albumin: 3.8 g/dL (ref 3.5–5.2)
Alkaline Phosphatase: 66 U/L (ref 39–117)
BUN: 15 mg/dL (ref 6–23)
CO2: 24 mEq/L (ref 19–32)
Calcium: 9.5 mg/dL (ref 8.4–10.5)
Chloride: 111 mEq/L (ref 96–112)
Creatinine, Ser: 0.97 mg/dL (ref 0.40–1.50)
GFR: 89.41 mL/min (ref >60.00)
Glucose, Bld: 95 mg/dL (ref 70–99)
Potassium: 3.7 mEq/L (ref 3.5–5.1)
Sodium: 142 mEq/L (ref 135–145)
Total Bilirubin: 0.5 mg/dL (ref 0.2–1.2)
Total Protein: 6.8 g/dL (ref 6.0–8.3)

## 2022-09-03 LAB — LIPID PANEL
Cholesterol: 144 mg/dL (ref 0–200)
HDL: 48.5 mg/dL (ref >39.00)
LDL Cholesterol: 78 mg/dL (ref 0–99)
NonHDL: 95.06
Total CHOL/HDL Ratio: 3
Triglycerides: 87 mg/dL (ref 0.0–149.0)
VLDL: 17.4 mg/dL (ref 0.0–40.0)

## 2022-09-03 LAB — HEMOGLOBIN A1C: Hgb A1c MFr Bld: 5.8 % (ref 4.6–6.5)

## 2022-09-03 LAB — TSH: TSH: 1.72 u[IU]/mL (ref 0.35–5.50)

## 2022-09-03 MED ORDER — VALSARTAN 320 MG PO TABS: 320.0000 mg | ORAL_TABLET | Freq: Every day | ORAL | 3 refills | Status: DC

## 2022-09-03 MED ORDER — NEBIVOLOL HCL 2.5 MG PO TABS: 2.5000 mg | ORAL_TABLET | Freq: Every day | ORAL | 3 refills | Status: DC

## 2022-09-03 NOTE — Patient Instructions (Signed)
Nice to see you. We are going to change your losartan to a medicine called valsartan.  This will be at a higher equivalent dose than your current dose of losartan.  You will reduce your Bystolic to 2.5 mg daily.  We will check labs in 2 weeks and she will follow-up with me in 1 month.  We will also check lab work today and contact you with those results.

## 2022-09-03 NOTE — Assessment & Plan Note (Addendum)
Chronic issue.  Adequate control.  I am going to change his losartan to valsartan 320 mg daily to see if this provides better benefit for his blood pressure.  We will reduce his Bystolic to 2.5 mg daily to see if that helps with his pulse.  He will return in 2 weeks for labs.  Will see me in 1 month.

## 2022-09-03 NOTE — Assessment & Plan Note (Signed)
Chronic issue.  Check lipid panel.  Continue Lipitor 80 mg daily.

## 2022-09-03 NOTE — Assessment & Plan Note (Signed)
Discussed increasing hydration and stretching.  Will check electrolytes.  Advised if he has discomfort after the cramp he could try Voltaren gel over-the-counter.

## 2022-09-03 NOTE — Progress Notes (Signed)
[name], MD Phone: [phone]  Wesley Bridges is a 51 y.o. male who presents today for follow-up.  Hyperlipidemia: Patient is taking Lipitor.  No right upper quadrant pain.  Occasional myalgias that are not any different than usual.  Hypertension: Typically 130/80.  Taking losartan and Bystolic.  No chest pain, shortness of breath, or edema.  Leg cramps: Patient notes leg cramps at night.  He notes at times the pain can last for days afterwards.  He notes he does not drink enough water.  He does not stretch.  Bradycardia: Patient notes for several months he has noticed that his pulse drops less than 50.  His watch alerts him to this.  He feels okay when this happens.  No palpitations.  He has chronic lightheadedness that occurs when he bends over and then stands up.  notes this is stable.  Anxiety/depression: Patient notes this is stable.  It is about the same.  He notes thoughts of being better off dead though notes no plan or intent to harm himself.  He declines medication at this time.     09/03/2022   10:36 AM 02/19/2022   10:41 AM 11/06/2021   10:55 AM 08/09/2021   10:09 AM 04/26/2021    1:43 PM  Depression screen PHQ 2/9  Decreased Interest 1 0 0 0 0  Down, Depressed, Hopeless 1 0 0 0 0  PHQ - 2 Score 2 0 0 0 0  Altered sleeping 0      Tired, decreased energy 2      Change in appetite 0      Feeling bad or failure about yourself  1      Trouble concentrating 2      Moving slowly or fidgety/restless 0      Suicidal thoughts 1      PHQ-9 Score 8      Difficult doing work/chores Somewhat difficult          09/03/2022   10:36 AM  GAD 7 : Generalized Anxiety Score  Nervous, Anxious, on Edge 0  Control/stop worrying 1  Worry too much - different things 2  Trouble relaxing 1  Restless 0  Easily annoyed or irritable 2  Afraid - awful might happen 2  Total GAD 7 Score 8  Anxiety Difficulty Somewhat difficult     Social History   Tobacco Use  Smoking  Status Never  Smokeless Tobacco Never    Current Outpatient Medications on File Prior to Visit  Medication Sig Dispense Refill   albuterol (VENTOLIN HFA) 108 (90 Base) MCG/ACT inhaler Inhale 2 puffs into the lungs every 6 (six) hours as needed for wheezing or shortness of breath. 8 g 0   aspirin EC 81 MG tablet Take 81 mg by mouth daily.     atorvastatin (LIPITOR) 80 MG tablet Take 1 tablet (80 mg total) by mouth daily. 90 tablet 3   benzonatate (TESSALON) 100 MG capsule Take 1 capsule (100 mg total) by mouth 3 (three) times daily as needed for cough. 30 capsule 0   fluticasone (FLONASE) 50 MCG/ACT nasal spray SPRAY 2 SPRAYS INTO EACH NOSTRIL EVERY DAY 48 mL 3   gabapentin (NEURONTIN) 300 MG capsule Take 300 mg by mouth 3 (three) times daily.     ibuprofen (ADVIL) 800 MG tablet Take 800 mg by mouth 3 (three) times daily as needed for moderate pain.      loratadine (CLARITIN) 10 MG tablet Take 1 tablet (10 mg total) by mouth daily. (  Patient taking differently: Take 20 mg by mouth daily.) 30 tablet 11   meloxicam (MOBIC) 15 MG tablet Take 15 mg by mouth daily.     Multiple Vitamin (MULTIVITAMIN WITH MINERALS) TABS tablet Take 1 tablet by mouth at bedtime.     nitroGLYCERIN (NITROSTAT) 0.4 MG SL tablet Place 1 tablet (0.4 mg total) under the tongue every 5 (five) minutes as needed for chest pain. 25 tablet 0   pantoprazole (PROTONIX) 40 MG tablet TAKE 1 TABLET TWICE A DAY BEFORE MEALS 60 tablet 11   Sod Picosulfate-Mag Ox-Cit Acd (CLENPIQ) 10-3.5-12 MG-GM -GM/160ML SOLN Take 320 mLs by mouth as directed. 320 mL 0   topiramate (TOPAMAX) 100 MG tablet Take 1 tablet (100 mg total) by mouth 2 (two) times daily. 180 tablet 3   No current facility-administered medications on file prior to visit.     ROS see history of present illness  Objective  Physical Exam Vitals:   09/03/22 1021  BP: 132/82  Pulse: (!) 52  Temp: 98 F (36.7 C)  SpO2: 99%    BP Readings from Last 3 Encounters:   09/03/22 132/82  08/15/22 133/88  02/19/22 112/70   Wt Readings from Last 3 Encounters:  09/03/22 203 lb 3.2 oz (92.2 kg)  08/15/22 207 lb 3.7 oz (94 kg)  02/19/22 206 lb 3.2 oz (93.5 kg)    Physical Exam Constitutional:      General: He is not in acute distress.    Appearance: He is not diaphoretic.  Cardiovascular:     Rate and Rhythm: Regular rhythm. Bradycardia present.     Heart sounds: Normal heart sounds.  Pulmonary:     Effort: Pulmonary effort is normal.     Breath sounds: Normal breath sounds.  Musculoskeletal:     Right lower leg: No edema.     Left lower leg: No edema.  Skin:    General: Skin is warm and dry.  Neurological:     Mental Status: He is alert.    EKG: Sinus bradycardia, incomplete right bundle branch block, no ischemic changes noted  Assessment/Plan: Please see individual problem list.  Essential hypertension Assessment & Plan: Chronic issue.  Adequate control.  I am going to change his losartan to valsartan 320 mg daily to see if this provides better benefit for his blood pressure.  We will reduce his Bystolic to 2.5 mg daily to see if that helps with his pulse.  He will return in 2 weeks for labs.  Will see me in 1 month.  Orders: -     Comprehensive metabolic panel -     Nebivolol HCl; Take 1 tablet (2.5 mg total) by mouth daily.  Dispense: 90 tablet; Refill: 3 -     Valsartan; Take 1 tablet (320 mg total) by mouth daily.  Dispense: 90 tablet; Refill: 3 -     Basic metabolic panel; Future  Hyperlipidemia, unspecified hyperlipidemia type Assessment & Plan: Chronic issue.  Check lipid panel.  Continue Lipitor 80 mg daily.  Orders: -     Comprehensive metabolic panel -     Lipid panel  Bradycardia Assessment & Plan: Likely related to Bystolic.  Will cut the dose to 2.5 mg daily.  Will check a TSH today.  He will monitor and will recheck in 1 month.  EKG completed which is reassuring.  Orders: -     EKG 12-Lead -      TSH  Prediabetes Assessment & Plan: Check A1c.  Orders: -  Hemoglobin A1c  Current moderate episode of major depressive disorder without prior episode [facility]) Assessment & Plan: Chronic issue.  Patient declines medication at this time.  He will let me know if anything worsens and we can consider medication management in the future.   Cramps, extremity Assessment & Plan: Discussed increasing hydration and stretching.  Will check electrolytes.  Advised if he has discomfort after the cramp he could try Voltaren gel over-the-counter.  Orders: -     Comprehensive metabolic panel   Return in about 2 weeks (around 09/17/2022) for Labs, 1 month follow-up with PCP for blood pressure.   [name], MD [place]

## 2022-09-03 NOTE — Assessment & Plan Note (Signed)
Likely related to Bystolic.  Will cut the dose to 2.5 mg daily.  Will check a TSH today.  He will monitor and will recheck in 1 month.  EKG completed which is reassuring.

## 2022-09-03 NOTE — Assessment & Plan Note (Signed)
Chronic issue.  Patient declines medication at this time.  He will let me know if anything worsens and we can consider medication management in the future.

## 2022-09-03 NOTE — Assessment & Plan Note (Signed)
Check A1c. 

## 2022-09-04 ENCOUNTER — Other Ambulatory Visit: Payer: Self-pay | Admitting: Family Medicine

## 2022-09-04 DIAGNOSIS — K219 Gastro-esophageal reflux disease without esophagitis: Secondary | ICD-10-CM

## 2022-09-08 ENCOUNTER — Encounter: Payer: Self-pay | Admitting: Family Medicine

## 2022-09-10 NOTE — Telephone Encounter (Signed)
Medication was last ordered on 09/04/22

## 2022-09-18 ENCOUNTER — Other Ambulatory Visit: Payer: Medicare Other

## 2022-09-25 ENCOUNTER — Other Ambulatory Visit: Payer: Medicare Other

## 2022-10-02 ENCOUNTER — Other Ambulatory Visit (INDEPENDENT_AMBULATORY_CARE_PROVIDER_SITE_OTHER): Payer: Medicare Other

## 2022-10-02 DIAGNOSIS — I10 Essential (primary) hypertension: Secondary | ICD-10-CM

## 2022-10-02 LAB — BASIC METABOLIC PANEL
BUN: 15 mg/dL (ref 6–23)
CO2: 23 mEq/L (ref 19–32)
Calcium: 9.6 mg/dL (ref 8.4–10.5)
Chloride: 111 mEq/L (ref 96–112)
Creatinine, Ser: 1.07 mg/dL (ref 0.40–1.50)
GFR: 79.43 mL/min (ref >60.00)
Glucose, Bld: 92 mg/dL (ref 70–99)
Potassium: 3.8 mEq/L (ref 3.5–5.1)
Sodium: 143 mEq/L (ref 135–145)

## 2022-10-05 ENCOUNTER — Ambulatory Visit (INDEPENDENT_AMBULATORY_CARE_PROVIDER_SITE_OTHER): Payer: Medicare Other | Admitting: Family Medicine

## 2022-10-05 VITALS — BP 126/78 | HR 63 | Temp 98.6°F | Ht 67.0 in | Wt 196.8 lb

## 2022-10-05 DIAGNOSIS — I10 Essential (primary) hypertension: Secondary | ICD-10-CM

## 2022-10-05 DIAGNOSIS — M5412 Radiculopathy, cervical region: Secondary | ICD-10-CM | POA: Diagnosis not present

## 2022-10-05 DIAGNOSIS — J309 Allergic rhinitis, unspecified: Secondary | ICD-10-CM | POA: Diagnosis not present

## 2022-10-05 MED ORDER — GABAPENTIN 300 MG PO CAPS: 300.0000 mg | ORAL_CAPSULE | Freq: Three times a day (TID) | ORAL | 1 refills | Status: DC

## 2022-10-05 MED ORDER — AMLODIPINE BESYLATE 5 MG PO TABS
5.0000 mg | ORAL_TABLET | Freq: Every day | ORAL | 2 refills | Status: DC
Start: 2022-10-05 — End: 2023-05-28

## 2022-10-05 MED ORDER — AZELASTINE HCL 0.1 % NA SOLN
2.0000 | Freq: Two times a day (BID) | NASAL | 12 refills | Status: DC
Start: 2022-10-05 — End: 2023-06-20

## 2022-10-05 NOTE — Assessment & Plan Note (Signed)
Chronic issue.  Continues to have symptoms.  He will see his surgeon next week.  I will take over his gabapentin 300 mg 3 times daily.  Refill sent to pharmacy.

## 2022-10-05 NOTE — Assessment & Plan Note (Signed)
Patient's recent symptoms are consistent with allergic rhinitis.  He does have this chronically though has been suboptimally controlled this allergy season.  He will continue Zyrtec over-the-counter and Flonase 2 sprays each nostril daily.  Will start Astelin 2 sprays each nostril twice daily.

## 2022-10-05 NOTE — Assessment & Plan Note (Addendum)
Chronic issue.  Well-controlled.  He appears to not be tolerating the valsartan.  Headache is listed as a possible side effect.  He will continue Bystolic 2.5 mg daily.  He will discontinue valsartan.  We will start the patient on amlodipine 5 mg daily. He will stop the valsartan once he receives the amlodipine from his mail order pharmacy.  He will continue to monitor his blood pressure.  If it trends up significantly after making the switch he will let us know.  Discussed risk of swelling and constipation and if those things occur he will contact us right away.

## 2022-10-05 NOTE — Patient Instructions (Signed)
Nice to see you. We are going to have you stop the valsartan and start on amlodipine.  If your headaches do not improve with making this change please let us know.  Please watch for swelling in your legs and constipation with the amlodipine. Please start the Astelin nasal spray to see if that helps with your allergy symptoms.

## 2022-10-05 NOTE — Progress Notes (Signed)
[name], MD Phone: [phone]  Wesley Bridges is a 52 y.o. male who presents today for f/u.  HYPERTENSION Disease Monitoring Home BP Monitoring 110s-120s/70s-80s Chest pain- no    Dyspnea- no Medications Compliance-  taking valsartan (has had headaches typical of his chronic headache issue, though more frequent since going on this), bystolic. Lightheadedness- no BMET    Component Value Date/Time   NA 143 10/02/2022 0956   NA 139 12/18/2016 1224   K 3.8 10/02/2022 0956   CL 111 10/02/2022 0956   CO2 23 10/02/2022 0956   GLUCOSE 92 10/02/2022 0956   BUN 15 10/02/2022 0956   BUN 15 12/18/2016 1224   CREATININE 1.07 10/02/2022 0956   CALCIUM 9.6 10/02/2022 0956   GFRNONAA >60 04/01/2019 1157   GFRAA >60 04/01/2019 1157   Cough: Patient has this has been going on for a week and a half or so.  Has had a scratchy throat.  Not much sneezing.  No congestion or postnasal drip.  He takes Zyrtec and Flonase.  Cervical radiculopathy: Patient has follow-up with his surgeon at [facility] next week to discuss the next steps.  He did 6 months of physical therapy and that was not beneficial.  He still gets numbness in his arms bilaterally at times.  He is on gabapentin to help with radicular pain.  He did have an MRI recently that revealed canal narrowing at C4-5 and C5-6.  Social History   Tobacco Use  Smoking Status Never  Smokeless Tobacco Never    Current Outpatient Medications on File Prior to Visit  Medication Sig Dispense Refill   albuterol (VENTOLIN HFA) 108 (90 Base) MCG/ACT inhaler Inhale 2 puffs into the lungs every 6 (six) hours as needed for wheezing or shortness of breath. 8 g 0   aspirin EC 81 MG tablet Take 81 mg by mouth daily.     atorvastatin (LIPITOR) 80 MG tablet Take 1 tablet (80 mg total) by mouth daily. 90 tablet 3   fluticasone (FLONASE) 50 MCG/ACT nasal spray SPRAY 2 SPRAYS INTO EACH NOSTRIL EVERY DAY 48 mL 3   ibuprofen (ADVIL) 800 MG tablet Take 800 mg  by mouth 3 (three) times daily as needed for moderate pain.      loratadine (CLARITIN) 10 MG tablet Take 1 tablet (10 mg total) by mouth daily. (Patient taking differently: Take 20 mg by mouth daily.) 30 tablet 11   Multiple Vitamin (MULTIVITAMIN WITH MINERALS) TABS tablet Take 1 tablet by mouth at bedtime.     nebivolol (BYSTOLIC) 2.5 MG tablet Take 1 tablet (2.5 mg total) by mouth daily. 90 tablet 3   nitroGLYCERIN (NITROSTAT) 0.4 MG SL tablet Place 1 tablet (0.4 mg total) under the tongue every 5 (five) minutes as needed for chest pain. 25 tablet 0   pantoprazole (PROTONIX) 40 MG tablet TAKE 1 TABLET TWICE A DAY BEFORE MEALS 60 tablet 11   topiramate (TOPAMAX) 100 MG tablet Take 1 tablet (100 mg total) by mouth 2 (two) times daily. 180 tablet 3   No current facility-administered medications on file prior to visit.     ROS see history of present illness  Objective  Physical Exam Vitals:   10/05/22 0936  BP: 126/78  Pulse: 63  Temp: 98.6 F (37 C)  SpO2: 99%    BP Readings from Last 3 Encounters:  10/05/22 126/78  09/03/22 132/82  08/15/22 133/88   Wt Readings from Last 3 Encounters:  10/05/22 196 lb 12.8 oz (89.3 kg)  09/03/22 203 lb 3.2 oz (92.2 kg)  08/15/22 207 lb 3.7 oz (94 kg)    Physical Exam Constitutional:      General: He is not in acute distress.    Appearance: He is not diaphoretic.  Cardiovascular:     Rate and Rhythm: Normal rate and regular rhythm.     Heart sounds: Normal heart sounds.  Pulmonary:     Effort: Pulmonary effort is normal.     Breath sounds: Normal breath sounds.  Skin:    General: Skin is warm and dry.  Neurological:     Mental Status: He is alert.      Assessment/Plan: Please see individual problem list.  Essential hypertension Assessment & Plan: Chronic issue.  Well-controlled.  He appears to not be tolerating the valsartan.  Headache is listed as a possible side effect.  He will continue Bystolic 2.5 mg daily.  He will  discontinue valsartan.  We will start the patient on amlodipine 5 mg daily. He will stop the valsartan once he receives the amlodipine from his mail order pharmacy.  He will continue to monitor his blood pressure.  If it trends up significantly after making the switch he will let us know.  Discussed risk of swelling and constipation and if those things occur he will contact us right away.  Orders: -     amLODIPine Besylate; Take 1 tablet (5 mg total) by mouth daily.  Dispense: 90 tablet; Refill: 2  Cervical radiculopathy Assessment & Plan: Chronic issue.  Continues to have symptoms.  He will see his surgeon next week.  I will take over his gabapentin 300 mg 3 times daily.  Refill sent to pharmacy.  Orders: -     Gabapentin; Take 1 capsule (300 mg total) by mouth 3 (three) times daily.  Dispense: 270 capsule; Refill: 1  Allergic rhinitis, unspecified seasonality, unspecified trigger Assessment & Plan: Patient's recent symptoms are consistent with allergic rhinitis.  He does have this chronically though has been suboptimally controlled this allergy season.  He will continue Zyrtec over-the-counter and Flonase 2 sprays each nostril daily.  Will start Astelin 2 sprays each nostril twice daily.  Orders: -     Azelastine HCl; Place 2 sprays into both nostrils 2 (two) times daily. Use in each nostril as directed  Dispense: 30 mL; Refill: 12    Return in about 6 weeks (around 11/16/2022) for Hypertension.   [name], MD [facility]

## 2022-10-29 ENCOUNTER — Telehealth: Payer: Self-pay | Admitting: Family Medicine

## 2022-10-29 NOTE — Telephone Encounter (Signed)
Contacted Wesley Bridges to schedule their annual wellness visit. Appointment made for 11/08/2022.  [name]; Care Guide Ambulatory Clinical Support [place] [facility] Direct Dial: [phone]

## 2022-11-07 NOTE — Patient Instructions (Signed)
Health Maintenance, Male Adopting a healthy lifestyle and getting preventive care are important in promoting health and wellness. Ask your health care provider about: The right schedule for you to have regular tests and exams. Things you can do on your own to prevent diseases and keep yourself healthy. What should I know about diet, weight, and exercise? Eat a healthy diet  Eat a diet that includes plenty of vegetables, fruits, low-fat dairy products, and lean protein. Do not eat a lot of foods that are high in solid fats, added sugars, or sodium. Maintain a healthy weight Body mass index (BMI) is a measurement that can be used to identify possible weight problems. It estimates body fat based on height and weight. Your health care provider can help determine your BMI and help you achieve or maintain a healthy weight. Get regular exercise Get regular exercise. This is one of the most important things you can do for your health. Most adults should: Exercise for at least 150 minutes each week. The exercise should increase your heart rate and make you sweat (moderate-intensity exercise). Do strengthening exercises at least twice a week. This is in addition to the moderate-intensity exercise. Spend less time sitting. Even light physical activity can be beneficial. Watch cholesterol and blood lipids Have your blood tested for lipids and cholesterol at 53 years of age, then have this test every 5 years. You may need to have your cholesterol levels checked more often if: Your lipid or cholesterol levels are high. You are older than 53 years of age. You are at high risk for heart disease. What should I know about cancer screening? Many types of cancers can be detected early and may often be prevented. Depending on your health history and family history, you may need to have cancer screening at various ages. This may include screening for: Colorectal cancer. Prostate cancer. Skin cancer. Lung  cancer. What should I know about heart disease, diabetes, and high blood pressure? Blood pressure and heart disease High blood pressure causes heart disease and increases the risk of stroke. This is more likely to develop in people who have high blood pressure readings or are overweight. Talk with your health care provider about your target blood pressure readings. Have your blood pressure checked: Every 3-5 years if you are 18-39 years of age. Every year if you are 40 years old or older. If you are between the ages of 65 and 75 and are a current or former smoker, ask your health care provider if you should have a one-time screening for abdominal aortic aneurysm (AAA). Diabetes Have regular diabetes screenings. This checks your fasting blood sugar level. Have the screening done: Once every three years after age 45 if you are at a normal weight and have a low risk for diabetes. More often and at a younger age if you are overweight or have a high risk for diabetes. What should I know about preventing infection? Hepatitis B If you have a higher risk for hepatitis B, you should be screened for this virus. Talk with your health care provider to find out if you are at risk for hepatitis B infection. Hepatitis C Blood testing is recommended for: Everyone born from 1945 through 1965. Anyone with known risk factors for hepatitis C. Sexually transmitted infections (STIs) You should be screened each year for STIs, including gonorrhea and chlamydia, if: You are sexually active and are younger than 53 years of age. You are older than 53 years of age and your   health care provider tells you that you are at risk for this type of infection. Your sexual activity has changed since you were last screened, and you are at increased risk for chlamydia or gonorrhea. Ask your health care provider if you are at risk. Ask your health care provider about whether you are at high risk for HIV. Your health care provider  may recommend a prescription medicine to help prevent HIV infection. If you choose to take medicine to prevent HIV, you should first get tested for HIV. You should then be tested every 3 months for as long as you are taking the medicine. Follow these instructions at home: Alcohol use Do not drink alcohol if your health care provider tells you not to drink. If you drink alcohol: Limit how much you have to 0-2 drinks a day. Know how much alcohol is in your drink. In the [place] one drink equals one 12 oz bottle of beer (355 mL), one 5 oz glass of wine (148 mL), or one 1 oz glass of hard liquor (44 mL). Lifestyle Do not use any products that contain nicotine or tobacco. These products include cigarettes, chewing tobacco, and vaping devices, such as e-cigarettes. If you need help quitting, ask your health care provider. Do not use street drugs. Do not share needles. Ask your health care provider for help if you need support or information about quitting drugs. General instructions Schedule regular health, dental, and eye exams. Stay current with your vaccines. Tell your health care provider if: You often feel depressed. You have ever been abused or do not feel safe at home. Summary Adopting a healthy lifestyle and getting preventive care are important in promoting health and wellness. Follow your health care provider's instructions about healthy diet, exercising, and getting tested or screened for diseases. Follow your health care provider's instructions on monitoring your cholesterol and blood pressure. This information is not intended to replace advice given to you by your health care provider. Make sure you discuss any questions you have with your health care provider. Document Revised: 10/31/2020 Document Reviewed: 10/31/2020 Elsevier Patient Education  2023 [employer] Inc.  

## 2022-11-07 NOTE — Progress Notes (Signed)
  I attempted to contact the patient for her scheduled Virtual Telephone Annual Wellness Visit. No answer. I left a detailed message on the patient's voicemail.   

## 2022-11-08 ENCOUNTER — Ambulatory Visit: Payer: Medicare Other

## 2022-11-08 DIAGNOSIS — Z0000 Encounter for general adult medical examination without abnormal findings: Secondary | ICD-10-CM

## 2022-11-15 ENCOUNTER — Ambulatory Visit (INDEPENDENT_AMBULATORY_CARE_PROVIDER_SITE_OTHER): Payer: Medicare Other | Admitting: *Deleted

## 2022-11-15 DIAGNOSIS — Z0000 Encounter for general adult medical examination without abnormal findings: Secondary | ICD-10-CM

## 2022-11-15 NOTE — Progress Notes (Signed)
Subjective:   Wesley Bridges is a 52 y.o. male who presents for Medicare Annual/Subsequent preventive examination.  I connected with  Wahid Fridman [name] on 11/15/22 by a telephone enabled telemedicine application and verified that I am speaking with the correct person using two identifiers.   I discussed the limitations of evaluation and management by telemedicine. The patient expressed understanding and agreed to proceed.  Patient location: home  Provider location: telephone/home    Review of Systems     Cardiac Risk Factors include: advanced age (>45men, >10 women);male gender;sedentary lifestyle;obesity (BMI >30kg/m2);hypertension     Objective:    Today's Vitals   11/15/22 1036  PainSc: 6    There is no height or weight on file to calculate BMI.     11/15/2022   10:47 AM 08/15/2022    6:06 PM 12/21/2021    8:37 AM 11/06/2021   10:54 AM 05/01/2020    2:18 PM 09/01/2019    6:25 AM 08/25/2019    3:58 PM  Advanced Directives  Does Patient Have a Medical Advance Directive? No No No No No No No  Would patient like information on creating a medical advance directive? No - Patient declined  No - Patient declined No - Patient declined  No - Patient declined No - Patient declined    Current Medications (verified) Outpatient Encounter Medications as of 11/15/2022  Medication Sig   albuterol (VENTOLIN HFA) 108 (90 Base) MCG/ACT inhaler Inhale 2 puffs into the lungs every 6 (six) hours as needed for wheezing or shortness of breath.   amLODipine (NORVASC) 5 MG tablet Take 1 tablet (5 mg total) by mouth daily.   aspirin EC 81 MG tablet Take 81 mg by mouth daily.   atorvastatin (LIPITOR) 80 MG tablet Take 1 tablet (80 mg total) by mouth daily.   azelastine (ASTELIN) 0.1 % nasal spray Place 2 sprays into both nostrils 2 (two) times daily. Use in each nostril as directed   fluticasone (FLONASE) 50 MCG/ACT nasal spray SPRAY 2 SPRAYS INTO EACH NOSTRIL EVERY DAY   gabapentin  (NEURONTIN) 300 MG capsule Take 1 capsule (300 mg total) by mouth 3 (three) times daily.   ibuprofen (ADVIL) 800 MG tablet Take 800 mg by mouth 3 (three) times daily as needed for moderate pain.    loratadine (CLARITIN) 10 MG tablet Take 1 tablet (10 mg total) by mouth daily. (Patient taking differently: Take 20 mg by mouth daily.)   Multiple Vitamin (MULTIVITAMIN WITH MINERALS) TABS tablet Take 1 tablet by mouth at bedtime.   nebivolol (BYSTOLIC) 2.5 MG tablet Take 1 tablet (2.5 mg total) by mouth daily.   nitroGLYCERIN (NITROSTAT) 0.4 MG SL tablet Place 1 tablet (0.4 mg total) under the tongue every 5 (five) minutes as needed for chest pain.   pantoprazole (PROTONIX) 40 MG tablet TAKE 1 TABLET TWICE A DAY BEFORE MEALS   topiramate (TOPAMAX) 100 MG tablet Take 1 tablet (100 mg total) by mouth 2 (two) times daily.   No facility-administered encounter medications on file as of 11/15/2022.    Allergies (verified) Bee pollen, Short ragweed pollen ext, and Pollen extract   History: Past Medical History:  Diagnosis Date   Anginal pain (HCC)    normal cath 2018   Arthritis    Asthma    Broken jaw (HCC)    approx 25 yrs afo.  some limitations opening mouth   Chronic headaches    Complication of anesthesia    takes longer to wake up  Depression    Dyspnea    Dysrhythmia    Family history of adverse reaction to anesthesia    son takes longer to wake up than a normal person   GERD (gastroesophageal reflux disease)    Hyperlipidemia    Hypertension    Osteoporosis    Sleep apnea    cpap   Subclinical hyperthyroidism    Past Surgical History:  Procedure Laterality Date   ANKLE FRACTURE SURGERY     ANKLE FUSION     [facility]   CARDIAC CATHETERIZATION     no stent   COLONOSCOPY WITH PROPOFOL N/A 12/21/2021   Procedure: COLONOSCOPY WITH PROPOFOL;  Surgeon: Midge Minium, MD;  Location: [facility];  Service: Endoscopy;  Laterality: N/A;   ELBOW ARTHROSCOPY Right 09/01/2019    Procedure: RIGHT ELBOW ARTHROSCOPY WITH DEBRIDEMENT AND EXCISION OF LOOSE BODIES;  Surgeon: Christena Flake, MD;  Location: [facility];  Service: Orthopedics;  Laterality: Right;   ESOPHAGOGASTRODUODENOSCOPY (EGD) WITH PROPOFOL N/A 12/21/2021   Procedure: ESOPHAGOGASTRODUODENOSCOPY (EGD) WITH PROPOFOL;  Surgeon: Midge Minium, MD;  Location: [facility];  Service: Endoscopy;  Laterality: N/A;   FEMUR FRACTURE SURGERY     FRACTURE SURGERY     HERNIA REPAIR Bilateral    inguinal   I & D EXTREMITY Right 07/30/2018   Procedure: DEBRIDEMENT OF THE COMMON EXTENSOR ORIGIN OF ELBOW;  Surgeon: Christena Flake, MD;  Location: [facility];  Service: Orthopedics;  Laterality: Right;  1.45 BIOMET JUGGERKNOT ANCHORS   KNEE ARTHROSCOPY WITH MEDIAL MENISECTOMY Left 05/13/2019   Procedure: KNEE ARTHROSCOPY WITH DEBRIDEMENT AND REPAIR CH;  Surgeon: Christena Flake, MD;  Location: [facility];  Service: Orthopedics;  Laterality: Left;   LEFT HEART CATH AND CORONARY ANGIOGRAPHY N/A 01/08/2017   Procedure: Left Heart Cath and Coronary Angiography;  Surgeon: Yvonne Kendall, MD;  Location: [facility];  Service: Cardiovascular;  Laterality: N/A;   MANDIBLE FRACTURE SURGERY     right elbow surgery  07/25/2020   [facility]   ROBOT ASSISTED INGUINAL HERNIA REPAIR Bilateral 04/17/2018   Procedure: ROBOT ASSISTED INGUINAL HERNIA REPAIR;  Surgeon: Leafy Ro, MD;  Location: [facility];  Service: General;  Laterality: Bilateral;   Family History  Problem Relation Age of Onset   Arthritis Other        parents   Hypertension Mother    Thyroid disease Mother    Osteoporosis Mother    Syncope episode Mother    Hypertension Brother    Social History   Socioeconomic History   Marital status: Married    Spouse name: Not on file   Number of children: 6   Years of education: Not on file   Highest education level: 12th grade  Occupational History   Not on file  Tobacco Use   Smoking status: Never    Smokeless tobacco: Never  Vaping Use   Vaping Use: Never used  Substance and Sexual Activity   Alcohol use: Yes    Alcohol/week: 14.0 standard drinks of alcohol    Types: 14 Cans of beer per week    Comment: 2-3 a day    Drug use: No   Sexual activity: Not Currently  Other Topics Concern   Not on file  Social History Narrative   Lives at home with family. Independent at baseline.   Social Determinants of Health   Financial Resource Strain: Medium Risk (11/15/2022)   Overall Financial Resource Strain (CARDIA)    Difficulty of Paying  Living Expenses: Somewhat hard  Food Insecurity: Food Insecurity Present (11/15/2022)   Hunger Vital Sign    Worried About Running Out of Food in the Last Year: Sometimes true    Ran Out of Food in the Last Year: Sometimes true  Transportation Needs: Unmet Transportation Needs (11/15/2022)   PRAPARE - Transportation    Lack of Transportation (Medical): Yes    Lack of Transportation (Non-Medical): Yes  Physical Activity: Inactive (11/15/2022)   Exercise Vital Sign    Days of Exercise per Week: 0 days    Minutes of Exercise per Session: 0 min  Stress: Stress Concern Present (11/15/2022)   [employer] of Occupational Health - Occupational Stress Questionnaire    Feeling of Stress : To some extent  Social Connections: Moderately Isolated (11/15/2022)   Social Connection and Isolation Panel [NHANES]    Frequency of Communication with Friends and Family: Twice a week    Frequency of Social Gatherings with Friends and Family: Once a week    Attends Religious Services: Never    Database administrator or Organizations: No    Attends Engineer, structural: Never    Marital Status: Married    Tobacco Counseling Counseling given: Not Answered   Clinical Intake:  Pre-visit preparation completed: Yes  Pain : 0-10 Pain Score: 6  Pain Type: Chronic pain Pain Location: Neck Pain Descriptors / Indicators: Aching, Burning, Constant, Dull,  Numbness Pain Onset: More than a month ago Pain Frequency: Intermittent Pain Relieving Factors: gabapentin, tylenol  Pain Relieving Factors: gabapentin, tylenol  Diabetes: No  How often do you need to have someone help you when you read instructions, pamphlets, or other written materials from your doctor or pharmacy?: 1 - Never  Diabetic?  no  Interpreter Needed?: No  Information entered by :: Remi Haggard LPN   Activities of Daily Living    11/15/2022   11:26 AM 11/14/2022    4:49 PM  In your present state of health, do you have any difficulty performing the following activities:  Hearing? 0 0  Vision? 1 1  Difficulty concentrating or making decisions? 1 1  Walking or climbing stairs? 1 1  Dressing or bathing? 1 1  Doing errands, shopping? 1   Preparing Food and eating ? N N  Using the Toilet? N N  In the past six months, have you accidently leaked urine? N N  Do you have problems with loss of bowel control? N N  Managing your Medications? N N  Managing your Finances? N N  Housekeeping or managing your Housekeeping? Malvin Johns    Patient Care Team: Glori Luis, MD as PCP - General (Family Medicine) End, Cristal Deer, MD as PCP - Cardiology (Cardiology) Lonell Face, MD as Consulting Physician (Neurology)  Indicate any recent Medical Services you may have received from other than [facility] providers in the past year (date may be approximate).     Assessment:   This is a routine wellness examination for [facility].  Hearing/Vision screen Hearing Screening - Comments:: No trouble hearing Vision Screening - Comments:: Up to date My eye doctor  Dietary issues and exercise activities discussed: Current Exercise Habits: The patient does not participate in regular exercise at present, Exercise limited by: orthopedic condition(s)   Goals Addressed             This Visit's Progress    Patient Stated       No goals at this time       Depression  Screen    11/15/2022    10:46 AM 10/05/2022    9:39 AM 09/03/2022   11:08 AM 09/03/2022   10:36 AM 02/19/2022   10:41 AM 11/06/2021   10:55 AM 08/09/2021   10:09 AM  PHQ 2/9 Scores  PHQ - 2 Score 2 2 2 2  0 0 0  PHQ- 9 Score 4 8 8 8        Fall Risk    11/15/2022   10:38 AM 11/14/2022    4:49 PM 11/08/2022    7:12 AM 10/05/2022    9:39 AM 09/03/2022   11:08 AM  Fall Risk   Falls in the past year? 1 1 1  0 1  Number falls in past yr: 1 1 1  0 1  Injury with Fall? 0 1 1 0 0  Risk for fall due to : Impaired balance/gait;Impaired mobility;History of fall(s)   No Fall Risks History of fall(s)  Follow up Falls evaluation completed;Education provided;Falls prevention discussed   Falls evaluation completed Falls evaluation completed    FALL RISK PREVENTION PERTAINING TO THE HOME:  Any stairs in or around the home? Yes  If so, are there any without handrails? No  Home free of loose throw rugs in walkways, pet beds, electrical cords, etc? Yes  Adequate lighting in your home to reduce risk of falls? Yes   ASSISTIVE DEVICES UTILIZED TO PREVENT FALLS:  Life alert? No  Use of a cane, walker or w/c? Yes  Grab bars in the bathroom? No  Shower chair or bench in shower? Yes  Elevated toilet seat or a handicapped toilet? No   TIMED UP AND GO:  Was the test performed? No .    Cognitive Function:        11/15/2022   10:41 AM 11/06/2021   10:57 AM  6CIT Screen  What Year? 0 points 0 points  What month? 0 points 0 points  What time? 0 points 0 points  Count back from 20 0 points   Months in reverse 0 points 0 points  Repeat phrase 0 points   Total Score 0 points     Immunizations Immunization History  Administered Date(s) Administered   Influenza, Seasonal, Injecte, Preservative Fre 04/09/2012   Influenza,inj,Quad PF,6+ Mos 04/08/2013, 04/11/2015, 03/22/2016, 03/26/2017, 04/02/2018, 04/07/2019, 04/26/2021, 02/19/2022   Influenza-Unspecified 04/02/2018, 04/07/2019   Moderna Sars-Covid-2 Vaccination  08/28/2019, 09/25/2019   PFIZER Comirnaty(Gray Top)Covid-19 Tri-Sucrose Vaccine 04/28/2020   Tdap 04/02/2018, 05/01/2020    TDAP status: Up to date  Flu Vaccine status: Up to date    Covid-19 vaccine status: Information provided on how to obtain vaccines.   Qualifies for Shingles Vaccine? Yes   Zostavax completed No   Shingrix Completed?: No.    Education has been provided regarding the importance of this vaccine. Patient has been advised to call insurance company to determine out of pocket expense if they have not yet received this vaccine. Advised may also receive vaccine at local pharmacy or Health Dept. Verbalized acceptance and understanding.  Screening Tests Health Maintenance  Topic Date Due   HIV Screening  Never done   COVID-19 Vaccine (4 - 2023-24 season) 12/01/2022 (Originally 02/23/2022)   Zoster Vaccines- Shingrix (1 of 2) 02/15/2023 (Originally 08/13/2019)   Hepatitis C Screening  11/15/2023 (Originally [birth year]1987)   INFLUENZA VACCINE  01/24/2023   Medicare Annual Wellness (AWV)  11/15/2023   DTaP/Tdap/Td (3 - Td or Tdap) 05/01/2030   COLONOSCOPY (Pts 45-38yrs Insurance coverage will need to be confirmed)  12/22/2031  HPV VACCINES  Aged Out    Health Maintenance  Health Maintenance Due  Topic Date Due   HIV Screening  Never done    Colorectal cancer screening: Type of screening: Colonoscopy. Completed 2023. Repeat every 10 years  Lung Cancer Screening: (Low Dose CT Chest recommended if Age 107-80 years, 30 pack-year currently smoking OR have quit w/in 15years.) does not qualify.   Lung Cancer Screening Referral:   Additional Screening:  Hepatitis C Screening: does not qualify;  Vision Screening: Recommended annual ophthalmology exams for early detection of glaucoma and other disorders of the eye. Is the patient up to date with their annual eye exam?  Yes  Who is the provider or what is the name of the office in which the patient attends annual eye exams? [facility] If pt is not established with a provider, would they like to be referred to a provider to establish care? No .   Dental Screening: Recommended annual dental exams for proper oral hygiene  Community Resource Referral / Chronic Care Management: CRR required this visit?  No   CCM required this visit?  No      Plan:     I have personally reviewed and noted the following in the patient's chart:   Medical and social history Use of alcohol, tobacco or illicit drugs  Current medications and supplements including opioid prescriptions. Patient is not currently taking opioid prescriptions. Functional ability and status Nutritional status Physical activity Advanced directives List of other physicians Hospitalizations, surgeries, and ER visits in previous 12 months Vitals Screenings to include cognitive, depression, and falls Referrals and appointments  In addition, I have reviewed and discussed with patient certain preventive protocols, quality metrics, and best practice recommendations. A written personalized care plan for preventive services as well as general preventive health recommendations were provided to patient.     Remi Haggard, LPN   1/61/0960   Nurse Notes:

## 2022-11-15 NOTE — Patient Instructions (Signed)
[name] , Thank you for taking time to come for your Medicare Wellness Visit. I appreciate your ongoing commitment to your health goals. Please review the following plan we discussed and let me know if I can assist you in the future.   Screening recommendations/referrals: Colonoscopy: up to date Recommended yearly ophthalmology/optometry visit for glaucoma screening and checkup Recommended yearly dental visit for hygiene and checkup  Vaccinations: Influenza vaccine: up to date Tdap vaccine: up to date Shingles vaccine: Education provided    Advanced directives: Education provided    Preventive Care 40-64 Years, Male Preventive care refers to lifestyle choices and visits with your health care provider that can promote health and wellness. What does preventive care include? A yearly physical exam. This is also called an annual well check. Dental exams once or twice a year. Routine eye exams. Ask your health care provider how often you should have your eyes checked. Personal lifestyle choices, including: Daily care of your teeth and gums. Regular physical activity. Eating a healthy diet. Avoiding tobacco and drug use. Limiting alcohol use. Practicing safe sex. Taking low-dose aspirin every day starting at age 35. What happens during an annual well check? The services and screenings done by your health care provider during your annual well check will depend on your age, overall health, lifestyle risk factors, and family history of disease. Counseling  Your health care provider may ask you questions about your: Alcohol use. Tobacco use. Drug use. Emotional well-being. Home and relationship well-being. Sexual activity. Eating habits. Work and work Astronomer. Screening  You may have the following tests or measurements: Height, weight, and BMI. Blood pressure. Lipid and cholesterol levels. These may be checked every 5 years, or more frequently if you are over 57  years old. Skin check. Lung cancer screening. You may have this screening every year starting at age 80 if you have a 30-pack-year history of smoking and currently smoke or have quit within the past 15 years. Fecal occult blood test (FOBT) of the stool. You may have this test every year starting at age 8. Flexible sigmoidoscopy or colonoscopy. You may have a sigmoidoscopy every 5 years or a colonoscopy every 10 years starting at age 53. Prostate cancer screening. Recommendations will vary depending on your family history and other risks. Hepatitis C blood test. Hepatitis B blood test. Sexually transmitted disease (STD) testing. Diabetes screening. This is done by checking your blood sugar (glucose) after you have not eaten for a while (fasting). You may have this done every 1-3 years. Discuss your test results, treatment options, and if necessary, the need for more tests with your health care provider. Vaccines  Your health care provider may recommend certain vaccines, such as: Influenza vaccine. This is recommended every year. Tetanus, diphtheria, and acellular pertussis (Tdap, Td) vaccine. You may need a Td booster every 10 years. Zoster vaccine. You may need this after age 45. Pneumococcal 13-valent conjugate (PCV13) vaccine. You may need this if you have certain conditions and have not been vaccinated. Pneumococcal polysaccharide (PPSV23) vaccine. You may need one or two doses if you smoke cigarettes or if you have certain conditions. Talk to your health care provider about which screenings and vaccines you need and how often you need them. This information is not intended to replace advice given to you by your health care provider. Make sure you discuss any questions you have with your health care provider. Document Released: 07/08/2015 Document Revised: 02/29/2016 Document Reviewed: 04/12/2015 Elsevier Interactive Patient Education  2017 Elsevier Inc.  Fall Prevention in the  Home Falls can cause injuries. They can happen to people of all ages. There are many things you can do to make your home safe and to help prevent falls. What can I do on the outside of my home? Regularly fix the edges of walkways and driveways and fix any cracks. Remove anything that might make you trip as you walk through a door, such as a raised step or threshold. Trim any bushes or trees on the path to your home. Use bright outdoor lighting. Clear any walking paths of anything that might make someone trip, such as rocks or tools. Regularly check to see if handrails are loose or broken. Make sure that both sides of any steps have handrails. Any raised decks and porches should have guardrails on the edges. Have any leaves, snow, or ice cleared regularly. Use sand or salt on walking paths during winter. Clean up any spills in your garage right away. This includes oil or grease spills. What can I do in the bathroom? Use night lights. Install grab bars by the toilet and in the tub and shower. Do not use towel bars as grab bars. Use non-skid mats or decals in the tub or shower. If you need to sit down in the shower, use a plastic, non-slip stool. Keep the floor dry. Clean up any water that spills on the floor as soon as it happens. Remove soap buildup in the tub or shower regularly. Attach bath mats securely with double-sided non-slip rug tape. Do not have throw rugs and other things on the floor that can make you trip. What can I do in the bedroom? Use night lights. Make sure that you have a light by your bed that is easy to reach. Do not use any sheets or blankets that are too big for your bed. They should not hang down onto the floor. Have a firm chair that has side arms. You can use this for support while you get dressed. Do not have throw rugs and other things on the floor that can make you trip. What can I do in the kitchen? Clean up any spills right away. Avoid walking on wet  floors. Keep items that you use a lot in easy-to-reach places. If you need to reach something above you, use a strong step stool that has a grab bar. Keep electrical cords out of the way. Do not use floor polish or wax that makes floors slippery. If you must use wax, use non-skid floor wax. Do not have throw rugs and other things on the floor that can make you trip. What can I do with my stairs? Do not leave any items on the stairs. Make sure that there are handrails on both sides of the stairs and use them. Fix handrails that are broken or loose. Make sure that handrails are as long as the stairways. Check any carpeting to make sure that it is firmly attached to the stairs. Fix any carpet that is loose or worn. Avoid having throw rugs at the top or bottom of the stairs. If you do have throw rugs, attach them to the floor with carpet tape. Make sure that you have a light switch at the top of the stairs and the bottom of the stairs. If you do not have them, ask someone to add them for you. What else can I do to help prevent falls? Wear shoes that: Do not have high heels. Have rubber bottoms.  Are comfortable and fit you well. Are closed at the toe. Do not wear sandals. If you use a stepladder: Make sure that it is fully opened. Do not climb a closed stepladder. Make sure that both sides of the stepladder are locked into place. Ask someone to hold it for you, if possible. Clearly mark and make sure that you can see: Any grab bars or handrails. First and last steps. Where the edge of each step is. Use tools that help you move around (mobility aids) if they are needed. These include: Canes. Walkers. Scooters. Crutches. Turn on the lights when you go into a dark area. Replace any light bulbs as soon as they burn out. Set up your furniture so you have a clear path. Avoid moving your furniture around. If any of your floors are uneven, fix them. If there are any pets around you, be aware of  where they are. Review your medicines with your doctor. Some medicines can make you feel dizzy. This can increase your chance of falling. Ask your doctor what other things that you can do to help prevent falls. This information is not intended to replace advice given to you by your health care provider. Make sure you discuss any questions you have with your health care provider. Document Released: 04/07/2009 Document Revised: 11/17/2015 Document Reviewed: 07/16/2014 Elsevier Interactive Patient Education  2017 [employer].

## 2022-11-26 ENCOUNTER — Ambulatory Visit (INDEPENDENT_AMBULATORY_CARE_PROVIDER_SITE_OTHER): Payer: Medicare Other | Admitting: Family Medicine

## 2022-11-26 ENCOUNTER — Encounter: Payer: Self-pay | Admitting: Family Medicine

## 2022-11-26 VITALS — BP 118/78 | HR 73 | Temp 98.4°F | Ht 67.0 in | Wt 196.0 lb

## 2022-11-26 DIAGNOSIS — I10 Essential (primary) hypertension: Secondary | ICD-10-CM | POA: Diagnosis not present

## 2022-11-26 DIAGNOSIS — M5412 Radiculopathy, cervical region: Secondary | ICD-10-CM

## 2022-11-26 DIAGNOSIS — R002 Palpitations: Secondary | ICD-10-CM | POA: Diagnosis not present

## 2022-11-26 MED ORDER — GABAPENTIN 300 MG PO CAPS: 300.0000 mg | ORAL_CAPSULE | Freq: Three times a day (TID) | ORAL | 1 refills | Status: DC

## 2022-11-26 NOTE — Assessment & Plan Note (Signed)
Chronic issue.  Adequately controlled.  Patient will continue amlodipine 5 mg daily and Bystolic 2.5 mg daily.

## 2022-11-26 NOTE — Assessment & Plan Note (Signed)
Chronic intermittent issue.  Has a history of PACs and PVCs.  Completed workup previously.  Recent TSH and BMP unremarkable for cause.  Discussed monitoring and if it becomes a more persistent issue or more frequently occurs he will let us know we have him see cardiology.  Patient will continue Bystolic 2.5 mg daily.

## 2022-11-26 NOTE — Progress Notes (Signed)
[name], MD Phone: [phone]  Wesley Bridges is a 52 y.o. male who presents today for f/u.  HYPERTENSION Disease Monitoring Home BP Monitoring 120s/80 Chest pain- no    Dyspnea- no Medications Compliance-  taking amlodipine, bystolic.   Edema- no BMET    Component Value Date/Time   NA 143 10/02/2022 0956   NA 139 12/18/2016 1224   K 3.8 10/02/2022 0956   CL 111 10/02/2022 0956   CO2 23 10/02/2022 0956   GLUCOSE 92 10/02/2022 0956   BUN 15 10/02/2022 0956   BUN 15 12/18/2016 1224   CREATININE 1.07 10/02/2022 0956   CALCIUM 9.6 10/02/2022 0956   GFRNONAA >60 04/01/2019 1157   GFRAA >60 04/01/2019 1157   Palpitations: Patient notes occasionally having these a little more frequently than usual.  They happen randomly.  They last a few seconds and go away on their own.  The last time they occurred he used his Apple Watch to look at his heart rate and it was in the 70s with no abnormalities per the Apple Watch.  He has previously seen cardiology for this.  Patient did have a Holter monitor done that showed PACs and PVCs.  This was in 2018.  Social History   Tobacco Use  Smoking Status Never  Smokeless Tobacco Never    Current Outpatient Medications on File Prior to Visit  Medication Sig Dispense Refill   albuterol (VENTOLIN HFA) 108 (90 Base) MCG/ACT inhaler Inhale 2 puffs into the lungs every 6 (six) hours as needed for wheezing or shortness of breath. 8 g 0   amLODipine (NORVASC) 5 MG tablet Take 1 tablet (5 mg total) by mouth daily. 90 tablet 2   aspirin EC 81 MG tablet Take 81 mg by mouth daily.     atorvastatin (LIPITOR) 80 MG tablet Take 1 tablet (80 mg total) by mouth daily. 90 tablet 3   azelastine (ASTELIN) 0.1 % nasal spray Place 2 sprays into both nostrils 2 (two) times daily. Use in each nostril as directed 30 mL 12   fluticasone (FLONASE) 50 MCG/ACT nasal spray SPRAY 2 SPRAYS INTO EACH NOSTRIL EVERY DAY 48 mL 3   ibuprofen (ADVIL) 800 MG tablet  Take 800 mg by mouth 3 (three) times daily as needed for moderate pain.      loratadine (CLARITIN) 10 MG tablet Take 1 tablet (10 mg total) by mouth daily. (Patient taking differently: Take 20 mg by mouth daily.) 30 tablet 11   Multiple Vitamin (MULTIVITAMIN WITH MINERALS) TABS tablet Take 1 tablet by mouth at bedtime.     nebivolol (BYSTOLIC) 2.5 MG tablet Take 1 tablet (2.5 mg total) by mouth daily. 90 tablet 3   nitroGLYCERIN (NITROSTAT) 0.4 MG SL tablet Place 1 tablet (0.4 mg total) under the tongue every 5 (five) minutes as needed for chest pain. 25 tablet 0   pantoprazole (PROTONIX) 40 MG tablet TAKE 1 TABLET TWICE A DAY BEFORE MEALS 60 tablet 11   topiramate (TOPAMAX) 100 MG tablet Take 1 tablet (100 mg total) by mouth 2 (two) times daily. 180 tablet 3   No current facility-administered medications on file prior to visit.     ROS see history of present illness  Objective  Physical Exam Vitals:   11/26/22 0848  BP: 118/78  Pulse: 73  Temp: 98.4 F (36.9 C)  SpO2: 99%    BP Readings from Last 3 Encounters:  11/26/22 118/78  10/05/22 126/78  09/03/22 132/82   Wt Readings from Last  3 Encounters:  11/26/22 196 lb (88.9 kg)  10/05/22 196 lb 12.8 oz (89.3 kg)  09/03/22 203 lb 3.2 oz (92.2 kg)    Physical Exam Constitutional:      General: He is not in acute distress.    Appearance: He is not diaphoretic.  Cardiovascular:     Rate and Rhythm: Normal rate and regular rhythm.     Heart sounds: Normal heart sounds.  Pulmonary:     Effort: Pulmonary effort is normal.     Breath sounds: Normal breath sounds.  Skin:    General: Skin is warm and dry.  Neurological:     Mental Status: He is alert.      Assessment/Plan: Please see individual problem list.  Essential hypertension Assessment & Plan: Chronic issue.  Adequately controlled.  Patient will continue amlodipine 5 mg daily and Bystolic 2.5 mg daily.   Cervical radiculopathy -     Gabapentin; Take 1 capsule  (300 mg total) by mouth 3 (three) times daily.  Dispense: 270 capsule; Refill: 1  Palpitations Assessment & Plan: Chronic intermittent issue.  Has a history of PACs and PVCs.  Completed workup previously.  Recent TSH and BMP unremarkable for cause.  Discussed monitoring and if it becomes a more persistent issue or more frequently occurs he will let us know we have him see cardiology.  Patient will continue Bystolic 2.5 mg daily.     Return in about 5 months (around 04/28/2023) for Hypertension/prediabetes.   [name], MD [facility]

## 2022-11-30 ENCOUNTER — Encounter: Payer: Self-pay | Admitting: Family Medicine

## 2022-12-05 ENCOUNTER — Other Ambulatory Visit: Payer: Self-pay | Admitting: Family

## 2022-12-05 MED ORDER — IBUPROFEN 800 MG PO TABS: 800.0000 mg | ORAL_TABLET | Freq: Three times a day (TID) | ORAL | 0 refills | Status: DC | PRN

## 2022-12-05 MED ORDER — NITROGLYCERIN 0.4 MG SL SUBL: 0.4000 mg | SUBLINGUAL_TABLET | SUBLINGUAL | 0 refills | Status: AC | PRN

## 2022-12-18 ENCOUNTER — Encounter: Payer: Self-pay | Admitting: Family Medicine

## 2022-12-25 ENCOUNTER — Other Ambulatory Visit: Payer: Self-pay

## 2022-12-25 NOTE — Telephone Encounter (Signed)
Noted. Please follow-up with the patient to see how often he is taking the ibuprofen. Thanks.

## 2022-12-25 NOTE — Telephone Encounter (Signed)
See mychart message from 11/30/22.  Pt using ibuprofen 800mg  for ankle and elbow pain.  [facility] refilled 12/05/22 #90/0.  Pt is requesting refill sent to mail order pharmacy.

## 2022-12-26 MED ORDER — DULOXETINE HCL 30 MG PO CPEP: ORAL_CAPSULE | ORAL | 3 refills | Status: DC

## 2022-12-26 NOTE — Addendum Note (Signed)
Addended by: Birdie Sons, Aujanae Mccullum G on: 12/26/2022 01:20 PM   Modules accepted: Orders

## 2022-12-28 NOTE — Telephone Encounter (Signed)
PC to pt to see how often he is taking ibuprofen. LMTCB

## 2022-12-28 NOTE — Telephone Encounter (Signed)
Pt reports that most days he only takes it 1 time but there are some days that the pain is worse and he takes it TID as the prescription says.

## 2022-12-31 NOTE — Telephone Encounter (Signed)
Noted.  Would he be willing to try something like meloxicam that he can take once a day instead of 3 times a day?  The meloxicam would be a slightly safer option than ibuprofen 3 times daily.

## 2023-01-07 MED ORDER — MELOXICAM 15 MG PO TABS: 15.0000 mg | ORAL_TABLET | Freq: Every day | ORAL | 0 refills | Status: DC | PRN

## 2023-01-07 NOTE — Telephone Encounter (Signed)
I have sent this to his pharmacy. He should not take any other antiinflammatories with this medication. The only over the counter pain medication he can take with this is tylenol.

## 2023-01-07 NOTE — Telephone Encounter (Signed)
Pt willing to try meloxicam. He would like the new prescription sent to [facility].

## 2023-01-07 NOTE — Addendum Note (Signed)
Addended by: Birdie Sons Zeeshan Korte G on: 01/07/2023 12:51 PM   Modules accepted: Orders

## 2023-01-09 NOTE — Telephone Encounter (Signed)
 Mychart message sent.

## 2023-01-17 ENCOUNTER — Other Ambulatory Visit: Payer: Self-pay | Admitting: Family Medicine

## 2023-02-20 ENCOUNTER — Telehealth: Payer: Self-pay

## 2023-02-20 NOTE — Telephone Encounter (Signed)
Error

## 2023-03-26 ENCOUNTER — Other Ambulatory Visit: Payer: Self-pay

## 2023-03-26 NOTE — Telephone Encounter (Signed)
Received refill request for Duloxetine 30mg . Instructions were "take 1 capsule daily for 14 days then 2 capsules daily."  Called and spoke with pt, he is taking the 2 capsules at 60mg . Requesting new prescription for 60mg  capsules so that pt can take 1 daily.  Pended for MD signature.

## 2023-03-27 MED ORDER — DULOXETINE HCL 60 MG PO CPEP
60.0000 mg | ORAL_CAPSULE | Freq: Every day | ORAL | 1 refills | Status: DC
Start: 2023-03-27 — End: 2023-04-29

## 2023-04-09 ENCOUNTER — Encounter: Payer: Self-pay | Admitting: Family Medicine

## 2023-04-09 ENCOUNTER — Telehealth: Payer: Medicare Other | Admitting: Physician Assistant

## 2023-04-09 DIAGNOSIS — J019 Acute sinusitis, unspecified: Secondary | ICD-10-CM

## 2023-04-09 DIAGNOSIS — B9689 Other specified bacterial agents as the cause of diseases classified elsewhere: Secondary | ICD-10-CM | POA: Diagnosis not present

## 2023-04-09 MED ORDER — BENZONATATE 100 MG PO CAPS
100.0000 mg | ORAL_CAPSULE | Freq: Three times a day (TID) | ORAL | 0 refills | Status: DC | PRN
Start: 2023-04-09 — End: 2023-09-17

## 2023-04-09 MED ORDER — DOXYCYCLINE HYCLATE 100 MG PO TABS
100.0000 mg | ORAL_TABLET | Freq: Two times a day (BID) | ORAL | 0 refills | Status: DC
Start: 2023-04-09 — End: 2023-09-17

## 2023-04-09 NOTE — Patient Instructions (Signed)
Caven A Bridges, thank you for joining [name], PA-C for today's virtual visit.  While this provider is not your primary care provider (PCP), if your PCP is located in our provider database this encounter information will be shared with them immediately following your visit.   A [facility] MyChart account gives you access to today's visit and all your visits, tests, and labs performed at [facility] " click here if you don't have a [facility] MyChart account or go to [email or web address]  Consent: (Patient) Wesley Bridges provided verbal consent for this virtual visit at the beginning of the encounter.  Current Medications:  Current Outpatient Medications:    albuterol (VENTOLIN HFA) 108 (90 Base) MCG/ACT inhaler, Inhale 2 puffs into the lungs every 6 (six) hours as needed for wheezing or shortness of breath., Disp: 8 g, Rfl: 0   amLODipine (NORVASC) 5 MG tablet, Take 1 tablet (5 mg total) by mouth daily., Disp: 90 tablet, Rfl: 2   aspirin EC 81 MG tablet, Take 81 mg by mouth daily., Disp: , Rfl:    atorvastatin (LIPITOR) 80 MG tablet, Take 1 tablet (80 mg total) by mouth daily., Disp: 90 tablet, Rfl: 3   azelastine (ASTELIN) 0.1 % nasal spray, Place 2 sprays into both nostrils 2 (two) times daily. Use in each nostril as directed, Disp: 30 mL, Rfl: 12   DULoxetine (CYMBALTA) 60 MG capsule, Take 1 capsule (60 mg total) by mouth daily., Disp: 90 capsule, Rfl: 1   fluticasone (FLONASE) 50 MCG/ACT nasal spray, SPRAY 2 SPRAYS INTO EACH NOSTRIL EVERY DAY, Disp: 48 mL, Rfl: 3   gabapentin (NEURONTIN) 300 MG capsule, Take 1 capsule (300 mg total) by mouth 3 (three) times daily., Disp: 270 capsule, Rfl: 1   loratadine (CLARITIN) 10 MG tablet, Take 1 tablet (10 mg total) by mouth daily. (Patient taking differently: Take 20 mg by mouth daily.), Disp: 30 tablet, Rfl: 11   meloxicam (MOBIC) 15 MG tablet, Take 1 tablet (15 mg total) by mouth daily as needed for  pain., Disp: 90 tablet, Rfl: 0   Multiple Vitamin (MULTIVITAMIN WITH MINERALS) TABS tablet, Take 1 tablet by mouth at bedtime., Disp: , Rfl:    nebivolol (BYSTOLIC) 2.5 MG tablet, Take 1 tablet (2.5 mg total) by mouth daily., Disp: 90 tablet, Rfl: 3   nitroGLYCERIN (NITROSTAT) 0.4 MG SL tablet, Place 1 tablet (0.4 mg total) under the tongue every 5 (five) minutes as needed for chest pain., Disp: 25 tablet, Rfl: 0   pantoprazole (PROTONIX) 40 MG tablet, TAKE 1 TABLET TWICE A DAY BEFORE MEALS, Disp: 60 tablet, Rfl: 11   topiramate (TOPAMAX) 100 MG tablet, Take 1 tablet (100 mg total) by mouth 2 (two) times daily., Disp: 180 tablet, Rfl: 3   Medications ordered in this encounter:  No orders of the defined types were placed in this encounter.    *If you need refills on other medications prior to your next appointment, please contact your pharmacy*  Follow-Up: Call back or seek an in-person evaluation if the symptoms worsen or if the condition fails to improve as anticipated.  [facility] [phone]  Other Instructions Please take antibiotic as directed.  Increase fluid intake.  Use Saline nasal spray.  Take a daily multivitamin. Continue your allergy medications. Start the [place] as directed for cough.  Place a humidifier in the bedroom.  Please call or return clinic if symptoms are not improving.  Sinusitis Sinusitis is redness, soreness, and swelling (inflammation)  of the paranasal sinuses. Paranasal sinuses are air pockets within the bones of your face (beneath the eyes, the middle of the forehead, or above the eyes). In healthy paranasal sinuses, mucus is able to drain out, and air is able to circulate through them by way of your nose. However, when your paranasal sinuses are inflamed, mucus and air can become trapped. This can allow bacteria and other germs to grow and cause infection. Sinusitis can develop quickly and last only a short time (acute) or continue over a long  period (chronic). Sinusitis that lasts for more than 12 weeks is considered chronic.  CAUSES  Causes of sinusitis include: Allergies. Structural abnormalities, such as displacement of the cartilage that separates your nostrils (deviated septum), which can decrease the air flow through your nose and sinuses and affect sinus drainage. Functional abnormalities, such as when the small hairs (cilia) that line your sinuses and help remove mucus do not work properly or are not present. SYMPTOMS  Symptoms of acute and chronic sinusitis are the same. The primary symptoms are pain and pressure around the affected sinuses. Other symptoms include: Upper toothache. Earache. Headache. Bad breath. Decreased sense of smell and taste. A cough, which worsens when you are lying flat. Fatigue. Fever. Thick drainage from your nose, which often is green and may contain pus (purulent). Swelling and warmth over the affected sinuses. DIAGNOSIS  Your caregiver will perform a physical exam. During the exam, your caregiver may: Look in your nose for signs of abnormal growths in your nostrils (nasal polyps). Tap over the affected sinus to check for signs of infection. View the inside of your sinuses (endoscopy) with a special imaging device with a light attached (endoscope), which is inserted into your sinuses. If your caregiver suspects that you have chronic sinusitis, one or more of the following tests may be recommended: Allergy tests. Nasal culture A sample of mucus is taken from your nose and sent to a lab and screened for bacteria. Nasal cytology A sample of mucus is taken from your nose and examined by your caregiver to determine if your sinusitis is related to an allergy. TREATMENT  Most cases of acute sinusitis are related to a viral infection and will resolve on their own within 10 days. Sometimes medicines are prescribed to help relieve symptoms (pain medicine, decongestants, nasal steroid sprays, or  saline sprays).  However, for sinusitis related to a bacterial infection, your caregiver will prescribe antibiotic medicines. These are medicines that will help kill the bacteria causing the infection.  Rarely, sinusitis is caused by a fungal infection. In theses cases, your caregiver will prescribe antifungal medicine. For some cases of chronic sinusitis, surgery is needed. Generally, these are cases in which sinusitis recurs more than 3 times per year, despite other treatments. HOME CARE INSTRUCTIONS  Drink plenty of water. Water helps thin the mucus so your sinuses can drain more easily. Use a humidifier. Inhale steam 3 to 4 times a day (for example, sit in the bathroom with the shower running). Apply a warm, moist washcloth to your face 3 to 4 times a day, or as directed by your caregiver. Use saline nasal sprays to help moisten and clean your sinuses. Take over-the-counter or prescription medicines for pain, discomfort, or fever only as directed by your caregiver. SEEK IMMEDIATE MEDICAL CARE IF: You have increasing pain or severe headaches. You have nausea, vomiting, or drowsiness. You have swelling around your face. You have vision problems. You have a stiff neck. You have  difficulty breathing. MAKE SURE YOU:  Understand these instructions. Will watch your condition. Will get help right away if you are not doing well or get worse. Document Released: 06/11/2005 Document Revised: 09/03/2011 Document Reviewed: 06/26/2011 [facility] Patient Information 2014 [place], [place].    If you have been instructed to have an in-person evaluation today at a local Urgent Care facility, please use the link below. It will take you to a list of all of our available [facility], including address, phone number and hours of operation. Please do not delay care.  [facility]  If you or a family member do not have a primary care provider, use the link below to schedule a visit and  establish care. When you choose a [facility] primary care physician or advanced practice provider, you gain a long-term partner in health. Find a Primary Care Provider  Learn more about [facility]'s in-office and virtual care options: [facility] - Get Care Now

## 2023-04-09 NOTE — Progress Notes (Signed)
Virtual Visit Consent   [name], you are scheduled for a virtual visit with a [facility] provider today. Just as with appointments in the office, your consent must be obtained to participate. Your consent will be active for this visit and any virtual visit you may have with one of our providers in the next 365 days. If you have a MyChart account, a copy of this consent can be sent to you electronically.  As this is a virtual visit, video technology does not allow for your provider to perform a traditional examination. This may limit your provider's ability to fully assess your condition. If your provider identifies any concerns that need to be evaluated in person or the need to arrange testing (such as labs, EKG, etc.), we will make arrangements to do so. Although advances in technology are sophisticated, we cannot ensure that it will always work on either your end or our end. If the connection with a video visit is poor, the visit may have to be switched to a telephone visit. With either a video or telephone visit, we are not always able to ensure that we have a secure connection.  By engaging in this virtual visit, you consent to the provision of healthcare and authorize for your insurance to be billed (if applicable) for the services provided during this visit. Depending on your insurance coverage, you may receive a charge related to this service.  I need to obtain your verbal consent now. Are you willing to proceed with your visit today? Wesley Bridges has provided verbal consent on 04/09/2023 for a virtual visit (video or telephone). Wesley Bridges, [place]  Date: 04/09/2023 10:27 AM  Virtual Visit via Video Note   I, Wesley Bridges, connected with  Wesley Bridges  ([id number], 07-Nov-1970) on 04/09/23 at 10:30 AM EDT by a video-enabled telemedicine application and verified that I am speaking with the correct person using two identifiers.  Location: Patient:  Virtual Visit Location Patient: Home Provider: Virtual Visit Location Provider: Home Office   I discussed the limitations of evaluation and management by telemedicine and the availability of in person appointments. The patient expressed understanding and agreed to proceed.    History of Present Illness: Wesley Bridges is a 52 y.o. who identifies as a male who was assigned male at birth, and is being seen today for a week of nasal congestion, rhinorrhea, pnd. As of 4 days ago noting persistent, dry cough with increased sinus congestion, sinus pressure and now with sinus pain and thick nasal discharge. Now with sore throat from PND and new onset fever in past 2 days.  OTC cough suppressants -- somewhat helpful. Continues his allergy medications (Claritin and Flonase)   HPI: HPI  Problems:  Patient Active Problem List   Diagnosis Date Noted   Allergic rhinitis 10/05/2022   Bradycardia 09/03/2022   Cramps, extremity 09/03/2022   Cervical radiculopathy 02/19/2022   Right upper quadrant pain 02/19/2022   Hand pain 11/08/2021   Fingernail abnormalities 11/08/2021   Skin lesion 08/09/2021   Lipoma of left lower extremity 04/26/2021   Sciatica 04/26/2021   Lumbar radiculopathy 04/26/2021   De Quervain's tenosynovitis, bilateral 11/02/2020   Right knee pain 11/02/2020   Obesity (BMI 30.0-34.9) 10/07/2019   Onychomycosis 10/07/2019   Loose body of right elbow 08/07/2019   Complex tear of medial meniscus of left knee as current injury 03/23/2019   Degenerative joint disease of elbow, right 12/08/2018   OSA (obstructive sleep apnea)  11/05/2018   Prediabetes 11/05/2018   Chondromalacia of right patella 10/23/2018   Arthritis 07/22/2018   Right lateral epicondylitis 07/14/2018   Headache disorder 04/10/2018   Elevated urine levels of catecholamines 04/03/2018   Right elbow pain 04/03/2018   Right forearm numbness 03/05/2018   Low TSH level 02/18/2017   Major depression, single  episode 09/16/2016   Palpitations 09/16/2016   Inguinal hernia 09/16/2016   Fibrosis of subtalar joint, right 07/16/2016   Sprain of right ankle 05/24/2016   Joint pain 04/18/2016   Enlarged prostate 03/22/2016   Lightheadedness 12/15/2015   Injury of toe on left foot 10/31/2015   Right ankle swelling 10/31/2015   Esophageal reflux 10/31/2015   Hyperlipidemia 04/13/2015   Essential hypertension 03/18/2015   Chest pain 03/18/2015    Allergies:  Allergies  Allergen Reactions   Bee Pollen Other (See Comments) and Cough    Cough runny eyes   Short Ragweed Pollen Ext Other (See Comments)   Pollen Extract    Medications:  Current Outpatient Medications:    benzonatate (TESSALON) 100 MG capsule, Take 1 capsule (100 mg total) by mouth 3 (three) times daily as needed for cough., Disp: 30 capsule, Rfl: 0   doxycycline (VIBRA-TABS) 100 MG tablet, Take 1 tablet (100 mg total) by mouth 2 (two) times daily., Disp: 20 tablet, Rfl: 0   pregabalin (LYRICA) 50 MG capsule, Take by mouth., Disp: , Rfl:    albuterol (VENTOLIN HFA) 108 (90 Base) MCG/ACT inhaler, Inhale 2 puffs into the lungs every 6 (six) hours as needed for wheezing or shortness of breath., Disp: 8 g, Rfl: 0   amLODipine (NORVASC) 5 MG tablet, Take 1 tablet (5 mg total) by mouth daily., Disp: 90 tablet, Rfl: 2   aspirin EC 81 MG tablet, Take 81 mg by mouth daily., Disp: , Rfl:    atorvastatin (LIPITOR) 80 MG tablet, Take 1 tablet (80 mg total) by mouth daily., Disp: 90 tablet, Rfl: 3   azelastine (ASTELIN) 0.1 % nasal spray, Place 2 sprays into both nostrils 2 (two) times daily. Use in each nostril as directed, Disp: 30 mL, Rfl: 12   DULoxetine (CYMBALTA) 60 MG capsule, Take 1 capsule (60 mg total) by mouth daily., Disp: 90 capsule, Rfl: 1   fluticasone (FLONASE) 50 MCG/ACT nasal spray, SPRAY 2 SPRAYS INTO EACH NOSTRIL EVERY DAY, Disp: 48 mL, Rfl: 3   loratadine (CLARITIN) 10 MG tablet, Take 1 tablet (10 mg total) by mouth daily.  (Patient taking differently: Take 20 mg by mouth daily.), Disp: 30 tablet, Rfl: 11   meloxicam (MOBIC) 15 MG tablet, Take 1 tablet (15 mg total) by mouth daily as needed for pain., Disp: 90 tablet, Rfl: 0   Multiple Vitamin (MULTIVITAMIN WITH MINERALS) TABS tablet, Take 1 tablet by mouth at bedtime., Disp: , Rfl:    nebivolol (BYSTOLIC) 2.5 MG tablet, Take 1 tablet (2.5 mg total) by mouth daily., Disp: 90 tablet, Rfl: 3   nitroGLYCERIN (NITROSTAT) 0.4 MG SL tablet, Place 1 tablet (0.4 mg total) under the tongue every 5 (five) minutes as needed for chest pain., Disp: 25 tablet, Rfl: 0   pantoprazole (PROTONIX) 40 MG tablet, TAKE 1 TABLET TWICE A DAY BEFORE MEALS, Disp: 60 tablet, Rfl: 11   topiramate (TOPAMAX) 100 MG tablet, Take 1 tablet (100 mg total) by mouth 2 (two) times daily., Disp: 180 tablet, Rfl: 3  Observations/Objective: Patient is well-developed, well-nourished in no acute distress.  Resting comfortably at home.  Head is normocephalic, atraumatic.  No labored breathing. Speech is clear and coherent with logical content.  Patient is alert and oriented at baseline.   Assessment and Plan: 1. Acute bacterial sinusitis - benzonatate (TESSALON) 100 MG capsule; Take 1 capsule (100 mg total) by mouth 3 (three) times daily as needed for cough.  Dispense: 30 capsule; Refill: 0 - doxycycline (VIBRA-TABS) 100 MG tablet; Take 1 tablet (100 mg total) by mouth 2 (two) times daily.  Dispense: 20 tablet; Refill: 0  Rx Doxycycline.  Increase fluids.  Rest.  Saline nasal spray.  Probiotic.  Mucinex as directed.  Humidifier in bedroom. Continue routine allergy medications. Tessalon per orders.  Call or return to clinic if symptoms are not improving.  Follow Up Instructions: I discussed the assessment and treatment plan with the patient. The patient was provided an opportunity to ask questions and all were answered. The patient agreed with the plan and demonstrated an understanding of the  instructions.  A copy of instructions were sent to the patient via MyChart unless otherwise noted below.   The patient was advised to call back or seek an in-person evaluation if the symptoms worsen or if the condition fails to improve as anticipated.    Wesley Climes, PA-C

## 2023-04-10 ENCOUNTER — Other Ambulatory Visit: Payer: Self-pay

## 2023-04-10 ENCOUNTER — Other Ambulatory Visit: Payer: Self-pay | Admitting: Family Medicine

## 2023-04-10 MED ORDER — TOPIRAMATE 100 MG PO TABS: 100.0000 mg | ORAL_TABLET | Freq: Two times a day (BID) | ORAL | 3 refills | Status: DC

## 2023-04-17 ENCOUNTER — Other Ambulatory Visit: Payer: Self-pay | Admitting: Family Medicine

## 2023-04-29 ENCOUNTER — Encounter: Payer: Self-pay | Admitting: Family Medicine

## 2023-04-29 ENCOUNTER — Ambulatory Visit (INDEPENDENT_AMBULATORY_CARE_PROVIDER_SITE_OTHER): Payer: Medicare Other | Admitting: Family Medicine

## 2023-04-29 VITALS — BP 130/82 | HR 53 | Temp 98.5°F | Ht 67.0 in | Wt 201.0 lb

## 2023-04-29 DIAGNOSIS — I10 Essential (primary) hypertension: Secondary | ICD-10-CM

## 2023-04-29 DIAGNOSIS — Z23 Encounter for immunization: Secondary | ICD-10-CM

## 2023-04-29 DIAGNOSIS — R252 Cramp and spasm: Secondary | ICD-10-CM | POA: Diagnosis not present

## 2023-04-29 DIAGNOSIS — D72829 Elevated white blood cell count, unspecified: Secondary | ICD-10-CM | POA: Diagnosis not present

## 2023-04-29 DIAGNOSIS — R09A2 Foreign body sensation, throat: Secondary | ICD-10-CM | POA: Diagnosis not present

## 2023-04-29 DIAGNOSIS — R7303 Prediabetes: Secondary | ICD-10-CM

## 2023-04-29 LAB — CBC WITH DIFFERENTIAL/PLATELET
Basophils Absolute: 0.1 10*3/uL (ref 0.0–0.1)
Basophils Relative: 1.3 % (ref 0.0–3.0)
Eosinophils Absolute: 0.2 10*3/uL (ref 0.0–0.7)
Eosinophils Relative: 2 % (ref 0.0–5.0)
HCT: 45.8 % (ref 39.0–52.0)
Hemoglobin: 14.9 g/dL (ref 13.0–17.0)
Lymphocytes Relative: 37.4 % (ref 12.0–46.0)
Lymphs Abs: 3 10*3/uL (ref 0.7–4.0)
MCHC: 32.6 g/dL (ref 30.0–36.0)
MCV: 95.1 fL (ref 78.0–100.0)
Monocytes Absolute: 0.7 10*3/uL (ref 0.1–1.0)
Monocytes Relative: 9 % (ref 3.0–12.0)
Neutro Abs: 4.1 10*3/uL (ref 1.4–7.7)
Neutrophils Relative %: 50.3 % (ref 43.0–77.0)
Platelets: 251 10*3/uL (ref 150.0–400.0)
RBC: 4.81 Mil/uL (ref 4.22–5.81)
RDW: 14.2 % (ref 11.5–15.5)
WBC: 8.1 10*3/uL (ref 4.0–10.5)

## 2023-04-29 LAB — BASIC METABOLIC PANEL
BUN: 20 mg/dL (ref 6–23)
CO2: 31 meq/L (ref 19–32)
Calcium: 9.5 mg/dL (ref 8.4–10.5)
Chloride: 103 meq/L (ref 96–112)
Creatinine, Ser: 0.99 mg/dL (ref 0.40–1.50)
GFR: 86.85 mL/min (ref >60.00)
Glucose, Bld: 88 mg/dL (ref 70–99)
Potassium: 4.6 meq/L (ref 3.5–5.1)
Sodium: 142 meq/L (ref 135–145)

## 2023-04-29 LAB — HEMOGLOBIN A1C: Hgb A1c MFr Bld: 5.7 % (ref 4.6–6.5)

## 2023-04-29 LAB — MAGNESIUM: Magnesium: 1.9 mg/dL (ref 1.5–2.5)

## 2023-04-29 NOTE — Assessment & Plan Note (Signed)
Noted on prior labs.  Recheck today.  Discussed symptoms or for seeing hematology if his lab work is still abnormal.

## 2023-04-29 NOTE — Assessment & Plan Note (Signed)
Chronic issue.  Adequately controlled.  Patient will continue Bystolic 2.5 mg daily and amlodipine 5 mg daily.

## 2023-04-29 NOTE — Progress Notes (Signed)
Wesley Alar, MD Phone: [phone]  Wesley Bridges is a 52 y.o. male who presents today for f/u.  HYPERTENSION Disease Monitoring Home BP Monitoring 130/80 Chest pain- no change to chronic CP    Dyspnea- no Medications Compliance-  taking amlodipine, bystolic.  Edema- no BMET    Component Value Date/Time   NA 143 10/02/2022 0956   NA 139 12/18/2016 1224   K 3.8 10/02/2022 0956   CL 111 10/02/2022 0956   CO2 23 10/02/2022 0956   GLUCOSE 92 10/02/2022 0956   BUN 15 10/02/2022 0956   BUN 15 12/18/2016 1224   CREATININE 1.07 10/02/2022 0956   CALCIUM 9.6 10/02/2022 0956   GFRNONAA >60 04/01/2019 1157   GFRAA >60 04/01/2019 1157    Prediabetes: Getting plenty of vegetables, minimal fried foods and minimal sweets.  Does have 3 diet sodas per day.  No sweet tea.  Not able to exercise given orthopedic limitations.  Chronic muscle cramps: Patient notes having issues with cramps in his legs.  Over the past week they have been occurring every 30 minutes at night.  He drinks 3 to 4 glasses of water daily.  Has been drinking 2 big cups of Pedialyte daily since this has been going on.  Occasionally gets cramps in his hands.  He does not really stretch.  Elevated white blood cell count: This was noted on labs through orthopedics in June.  He notes he was not ill at that time.  Lump in throat: Patient notes he was sick a month or so ago with an upper respiratory illness.  Since then he has had a lump in his throat and he will get hoarse and loses voice.  He notes all of his symptoms improved except for those.  They have not been improving at all.  He does take Astelin, Flonase, and Claritin.  Social History   Tobacco Use  Smoking Status Never  Smokeless Tobacco Never    Current Outpatient Medications on File Prior to Visit  Medication Sig Dispense Refill   albuterol (VENTOLIN HFA) 108 (90 Base) MCG/ACT inhaler Inhale 2 puffs into the lungs every 6 (six) hours as needed for  wheezing or shortness of breath. 8 g 0   amLODipine (NORVASC) 5 MG tablet Take 1 tablet (5 mg total) by mouth daily. 90 tablet 2   aspirin EC 81 MG tablet Take 81 mg by mouth daily.     atorvastatin (LIPITOR) 80 MG tablet Take 1 tablet (80 mg total) by mouth daily. 90 tablet 3   azelastine (ASTELIN) 0.1 % nasal spray Place 2 sprays into both nostrils 2 (two) times daily. Use in each nostril as directed 30 mL 12   benzonatate (TESSALON) 100 MG capsule Take 1 capsule (100 mg total) by mouth 3 (three) times daily as needed for cough. 30 capsule 0   doxycycline (VIBRA-TABS) 100 MG tablet Take 1 tablet (100 mg total) by mouth 2 (two) times daily. 20 tablet 0   fluticasone (FLONASE) 50 MCG/ACT nasal spray SPRAY 2 SPRAYS INTO EACH NOSTRIL EVERY DAY 48 mL 3   loratadine (CLARITIN) 10 MG tablet Take 1 tablet (10 mg total) by mouth daily. (Patient taking differently: Take 20 mg by mouth daily.) 30 tablet 11   meloxicam (MOBIC) 15 MG tablet TAKE 1 TABLET DAILY AS NEEDED FOR PAIN 90 tablet 1   Multiple Vitamin (MULTIVITAMIN WITH MINERALS) TABS tablet Take 1 tablet by mouth at bedtime.     nebivolol (BYSTOLIC) 2.5 MG tablet Take 1  tablet (2.5 mg total) by mouth daily. 90 tablet 3   nitroGLYCERIN (NITROSTAT) 0.4 MG SL tablet Place 1 tablet (0.4 mg total) under the tongue every 5 (five) minutes as needed for chest pain. 25 tablet 0   pantoprazole (PROTONIX) 40 MG tablet TAKE 1 TABLET TWICE A DAY BEFORE MEALS 60 tablet 11   pregabalin (LYRICA) 50 MG capsule Take by mouth.     topiramate (TOPAMAX) 100 MG tablet TAKE 1 TABLET TWICE A DAY 180 tablet 3   No current facility-administered medications on file prior to visit.     ROS see history of present illness  Objective  Physical Exam Vitals:   04/29/23 0914  BP: 130/82  Pulse: (!) 53  Temp: 98.5 F (36.9 C)  SpO2: 97%    BP Readings from Last 3 Encounters:  04/29/23 130/82  11/26/22 118/78  10/05/22 126/78   Wt Readings from Last 3 Encounters:   04/29/23 201 lb (91.2 kg)  11/26/22 196 lb (88.9 kg)  10/05/22 196 lb 12.8 oz (89.3 kg)    Physical Exam Constitutional:      General: He is not in acute distress.    Appearance: He is not diaphoretic.  HENT:     Mouth/Throat:     Mouth: Mucous membranes are moist.     Pharynx: Oropharynx is clear.  Cardiovascular:     Rate and Rhythm: Normal rate and regular rhythm.     Heart sounds: Normal heart sounds.  Pulmonary:     Effort: Pulmonary effort is normal.     Breath sounds: Normal breath sounds.  Lymphadenopathy:     Cervical: No cervical adenopathy.  Skin:    General: Skin is warm and dry.  Neurological:     Mental Status: He is alert.      Assessment/Plan: Please see individual problem list.  Essential hypertension Assessment & Plan: Chronic issue.  Adequately controlled.  Patient will continue Bystolic 2.5 mg daily and amlodipine 5 mg daily.   Prediabetes Assessment & Plan: Chronic issue.  Check A1c.  Encouraged healthy diet.  Patient will remain as active as he is able to given his limitations.  Orders: -     Hemoglobin A1c  Globus sensation Assessment & Plan: Discussed that this could just be aftereffects from his recent infection or could be related to allergies.  He will try switching his Claritin to his a different second-generation antihistamine that is over-the-counter.  We will go ahead and refer to ENT as he would need direct visualization if this sensation does not improve.  Advised if it resolves before he sees ENT he could cancel the appointment.  Orders: -     Ambulatory referral to ENT  Muscle cramp Assessment & Plan: Chronic issue.  Has worsened recently.  Discussed adding an additional water and reducing his soda intake.  Discussed stretching.  We will check electrolytes today.  Discussed if his labs are acceptable then we may need to consider holding the see if that makes a difference.  Discussed the potential for trying a muscle relaxer if  were not able to identify a cause for his symptoms.  Orders: -     Basic metabolic panel -     Magnesium  Leukocytosis, unspecified type Assessment & Plan: Noted on prior labs.  Recheck today.  Discussed symptoms or for seeing hematology if his lab work is still abnormal.  Orders: -     CBC with Differential/Platelet  Encounter for administration of vaccine -  Flu vaccine trivalent PF, 6mos and older(Flulaval,Afluria,Fluarix,Fluzone)     Return in about 6 months (around 10/27/2023) for transfer of care.   Wesley Alar, MD [facility]

## 2023-04-29 NOTE — Assessment & Plan Note (Signed)
Discussed that this could just be aftereffects from his recent infection or could be related to allergies.  He will try switching his Claritin to his a different second-generation antihistamine that is over-the-counter.  We will go ahead and refer to ENT as he would need direct visualization if this sensation does not improve.  Advised if it resolves before he sees ENT he could cancel the appointment.

## 2023-04-29 NOTE — Assessment & Plan Note (Signed)
Chronic issue.  Check A1c.  Encouraged healthy diet.  Patient will remain as active as he is able to given his limitations.

## 2023-04-29 NOTE — Assessment & Plan Note (Signed)
Chronic issue.  Has worsened recently.  Discussed adding an additional water and reducing his soda intake.  Discussed stretching.  We will check electrolytes today.  Discussed if his labs are acceptable then we may need to consider holding the see if that makes a difference.  Discussed the potential for trying a muscle relaxer if were not able to identify a cause for his symptoms.

## 2023-05-28 ENCOUNTER — Other Ambulatory Visit: Payer: Self-pay | Admitting: Family Medicine

## 2023-05-28 DIAGNOSIS — I10 Essential (primary) hypertension: Secondary | ICD-10-CM

## 2023-06-04 ENCOUNTER — Telehealth: Payer: Self-pay | Admitting: Family Medicine

## 2023-06-04 NOTE — Telephone Encounter (Signed)
Please call the patient.  I received a fax from his insurance company noting that he has been prescribed gabapentin and Lyrica.  Is he currently taking gabapentin or is he taking Lyrica?  We no longer have gabapentin on her medication list.  If he is taking both please find out what doses he is taking.

## 2023-06-05 NOTE — Telephone Encounter (Signed)
Patient states pain doctor had him on both but it was not helping so she took him off of both of them.

## 2023-06-06 ENCOUNTER — Other Ambulatory Visit: Payer: Self-pay

## 2023-06-06 NOTE — Telephone Encounter (Signed)
Noted. Please remove the lyrica as he notes he is not taking it.

## 2023-06-06 NOTE — Telephone Encounter (Signed)
Medication removed.

## 2023-06-17 ENCOUNTER — Other Ambulatory Visit: Payer: Self-pay

## 2023-06-17 DIAGNOSIS — K219 Gastro-esophageal reflux disease without esophagitis: Secondary | ICD-10-CM

## 2023-06-17 MED ORDER — PANTOPRAZOLE SODIUM 40 MG PO TBEC: 40.0000 mg | DELAYED_RELEASE_TABLET | Freq: Two times a day (BID) | ORAL | 11 refills | Status: DC

## 2023-06-18 ENCOUNTER — Other Ambulatory Visit: Payer: Self-pay

## 2023-06-18 ENCOUNTER — Other Ambulatory Visit: Payer: Self-pay | Admitting: Family Medicine

## 2023-06-18 DIAGNOSIS — K219 Gastro-esophageal reflux disease without esophagitis: Secondary | ICD-10-CM

## 2023-06-18 DIAGNOSIS — I10 Essential (primary) hypertension: Secondary | ICD-10-CM

## 2023-06-18 DIAGNOSIS — J309 Allergic rhinitis, unspecified: Secondary | ICD-10-CM

## 2023-06-18 MED ORDER — NEBIVOLOL HCL 2.5 MG PO TABS: 2.5000 mg | ORAL_TABLET | Freq: Every day | ORAL | 3 refills | Status: DC

## 2023-06-18 MED ORDER — PANTOPRAZOLE SODIUM 40 MG PO TBEC: 40.0000 mg | DELAYED_RELEASE_TABLET | Freq: Two times a day (BID) | ORAL | 1 refills | Status: DC

## 2023-06-18 MED ORDER — TOPIRAMATE 100 MG PO TABS: 100.0000 mg | ORAL_TABLET | Freq: Two times a day (BID) | ORAL | 3 refills | Status: DC

## 2023-06-20 ENCOUNTER — Other Ambulatory Visit: Payer: Self-pay

## 2023-06-20 DIAGNOSIS — T7840XS Allergy, unspecified, sequela: Secondary | ICD-10-CM

## 2023-06-20 MED ORDER — FLUTICASONE PROPIONATE 50 MCG/ACT NA SUSP: NASAL | 3 refills | Status: DC

## 2023-06-21 ENCOUNTER — Other Ambulatory Visit: Payer: Self-pay | Admitting: Family Medicine

## 2023-06-21 DIAGNOSIS — E785 Hyperlipidemia, unspecified: Secondary | ICD-10-CM

## 2023-06-24 ENCOUNTER — Other Ambulatory Visit: Payer: Self-pay

## 2023-06-24 DIAGNOSIS — K219 Gastro-esophageal reflux disease without esophagitis: Secondary | ICD-10-CM

## 2023-06-24 DIAGNOSIS — I10 Essential (primary) hypertension: Secondary | ICD-10-CM

## 2023-06-24 MED ORDER — TOPIRAMATE 100 MG PO TABS: 100.0000 mg | ORAL_TABLET | Freq: Two times a day (BID) | ORAL | 3 refills | Status: DC

## 2023-06-24 MED ORDER — AMLODIPINE BESYLATE 5 MG PO TABS: 5.0000 mg | ORAL_TABLET | Freq: Every day | ORAL | 3 refills | Status: DC

## 2023-06-24 MED ORDER — PANTOPRAZOLE SODIUM 40 MG PO TBEC: 40.0000 mg | DELAYED_RELEASE_TABLET | Freq: Two times a day (BID) | ORAL | 1 refills | Status: DC

## 2023-06-24 MED ORDER — NEBIVOLOL HCL 2.5 MG PO TABS: 2.5000 mg | ORAL_TABLET | Freq: Every day | ORAL | 3 refills | Status: DC

## 2023-07-09 ENCOUNTER — Other Ambulatory Visit: Payer: Self-pay | Admitting: Family Medicine

## 2023-07-09 DIAGNOSIS — T7840XS Allergy, unspecified, sequela: Secondary | ICD-10-CM

## 2023-09-17 ENCOUNTER — Telehealth: Admitting: Physician Assistant

## 2023-09-17 DIAGNOSIS — J069 Acute upper respiratory infection, unspecified: Secondary | ICD-10-CM

## 2023-09-17 DIAGNOSIS — B9689 Other specified bacterial agents as the cause of diseases classified elsewhere: Secondary | ICD-10-CM | POA: Diagnosis not present

## 2023-09-17 MED ORDER — BENZONATATE 100 MG PO CAPS: 100.0000 mg | ORAL_CAPSULE | Freq: Three times a day (TID) | ORAL | 0 refills | Status: DC | PRN

## 2023-09-17 MED ORDER — ALBUTEROL SULFATE HFA 108 (90 BASE) MCG/ACT IN AERS: 1.0000 | INHALATION_SPRAY | Freq: Four times a day (QID) | RESPIRATORY_TRACT | 0 refills | Status: DC | PRN

## 2023-09-17 MED ORDER — PROMETHAZINE-DM 6.25-15 MG/5ML PO SYRP: 5.0000 mL | ORAL_SOLUTION | Freq: Four times a day (QID) | ORAL | 0 refills | Status: DC | PRN

## 2023-09-17 MED ORDER — AMOXICILLIN-POT CLAVULANATE 875-125 MG PO TABS: 1.0000 | ORAL_TABLET | Freq: Two times a day (BID) | ORAL | 0 refills | Status: DC

## 2023-09-17 NOTE — Patient Instructions (Signed)
 Wesley Bridges, thank you for joining Margaretann Loveless, PA-C for today's virtual visit.  While this provider is not your primary care provider (PCP), if your PCP is located in our provider database this encounter information will be shared with them immediately following your visit.   A [facility] MyChart account gives you access to today's visit and all your visits, tests, and labs performed at [facility] " click here if you don't have a [facility] MyChart account or go to [email or web address]  Consent: (Patient) Wesley Bridges provided verbal consent for this virtual visit at the beginning of the encounter.  Current Medications:  Current Outpatient Medications:    albuterol (VENTOLIN HFA) 108 (90 Base) MCG/ACT inhaler, Inhale 1-2 puffs into the lungs every 6 (six) hours as needed., Disp: 8 g, Rfl: 0   amoxicillin-clavulanate (AUGMENTIN) 875-125 MG tablet, Take 1 tablet by mouth 2 (two) times daily., Disp: 20 tablet, Rfl: 0   benzonatate (TESSALON) 100 MG capsule, Take 1-2 capsules (100-200 mg total) by mouth 3 (three) times daily as needed., Disp: 30 capsule, Rfl: 0   promethazine-dextromethorphan (PROMETHAZINE-DM) 6.25-15 MG/5ML syrup, Take 5 mLs by mouth 4 (four) times daily as needed., Disp: 118 mL, Rfl: 0   amLODipine (NORVASC) 5 MG tablet, Take 1 tablet (5 mg total) by mouth daily., Disp: 90 tablet, Rfl: 3   aspirin EC 81 MG tablet, Take 81 mg by mouth daily., Disp: , Rfl:    atorvastatin (LIPITOR) 80 MG tablet, TAKE 1 TABLET DAILY, Disp: 90 tablet, Rfl: 3   azelastine (ASTELIN) 0.1 % nasal spray, USE 2 SPRAYS IN EACH NOSTRIL TWICE DAILY AS DIRECTED, Disp: 120 mL, Rfl: 3   fluticasone (FLONASE) 50 MCG/ACT nasal spray, USE 2 SPRAYS IN EACH NOSTRIL DAILY, Disp: 48 g, Rfl: 3   loratadine (CLARITIN) 10 MG tablet, Take 1 tablet (10 mg total) by mouth daily. (Patient taking differently: Take 20 mg by mouth daily.), Disp: 30 tablet, Rfl: 11   meloxicam  (MOBIC) 15 MG tablet, TAKE 1 TABLET DAILY AS NEEDED FOR PAIN, Disp: 90 tablet, Rfl: 1   Multiple Vitamin (MULTIVITAMIN WITH MINERALS) TABS tablet, Take 1 tablet by mouth at bedtime., Disp: , Rfl:    nebivolol (BYSTOLIC) 2.5 MG tablet, Take 1 tablet (2.5 mg total) by mouth daily., Disp: 90 tablet, Rfl: 3   nitroGLYCERIN (NITROSTAT) 0.4 MG SL tablet, Place 1 tablet (0.4 mg total) under the tongue every 5 (five) minutes as needed for chest pain., Disp: 25 tablet, Rfl: 0   pantoprazole (PROTONIX) 40 MG tablet, Take 1 tablet (40 mg total) by mouth 2 (two) times daily., Disp: 180 tablet, Rfl: 1   topiramate (TOPAMAX) 100 MG tablet, Take 1 tablet (100 mg total) by mouth 2 (two) times daily., Disp: 180 tablet, Rfl: 3   Medications ordered in this encounter:  Meds ordered this encounter  Medications   amoxicillin-clavulanate (AUGMENTIN) 875-125 MG tablet    Sig: Take 1 tablet by mouth 2 (two) times daily.    Dispense:  20 tablet    Refill:  0    Supervising Provider:   Merrilee Jansky [[id number]]   albuterol (VENTOLIN HFA) 108 (90 Base) MCG/ACT inhaler    Sig: Inhale 1-2 puffs into the lungs every 6 (six) hours as needed.    Dispense:  8 g    Refill:  0    Supervising Provider:   Merrilee Jansky [id number]   promethazine-dextromethorphan (PROMETHAZINE-DM) 6.25-15 MG/5ML syrup    Sig: Take 5  mLs by mouth 4 (four) times daily as needed.    Dispense:  118 mL    Refill:  0    Supervising Provider:   Merrilee Jansky [[id number]]   benzonatate (TESSALON) 100 MG capsule    Sig: Take 1-2 capsules (100-200 mg total) by mouth 3 (three) times daily as needed.    Dispense:  30 capsule    Refill:  0    Supervising Provider:   Merrilee Jansky [[id number]]     *If you need refills on other medications prior to your next appointment, please contact your pharmacy*  Follow-Up: Call back or seek an in-person evaluation if the symptoms worsen or if the condition fails to improve as anticipated.  [facility] [phone]  Other Instructions Upper Respiratory Infection, Adult An upper respiratory infection (URI) is a common viral infection of the nose, throat, and upper air passages that lead to the lungs. The most common type of URI is the common cold. URIs usually get better on their own, without medical treatment. What are the causes? A URI is caused by a virus. You may catch a virus by: Breathing in droplets from an infected person's cough or sneeze. Touching something that has been exposed to the virus (is contaminated) and then touching your mouth, nose, or eyes. What increases the risk? You are more likely to get a URI if: You are very young or very old. You have close contact with others, such as at work, school, or a health care facility. You smoke. You have long-term (chronic) heart or lung disease. You have a weakened disease-fighting system (immune system). You have nasal allergies or asthma. You are experiencing a lot of stress. You have poor nutrition. What are the signs or symptoms? A URI usually involves some of the following symptoms: Runny or stuffy (congested) nose. Cough. Sneezing. Sore throat. Headache. Fatigue. Fever. Loss of appetite. Pain in your forehead, behind your eyes, and over your cheekbones (sinus pain). Muscle aches. Redness or irritation of the eyes. Pressure in the ears or face. How is this diagnosed? This condition may be diagnosed based on your medical history and symptoms, and a physical exam. Your health care provider may use a swab to take a mucus sample from your nose (nasal swab). This sample can be tested to determine what virus is causing the illness. How is this treated? URIs usually get better on their own within 7-10 days. Medicines cannot cure URIs, but your health care provider may recommend certain medicines to help relieve symptoms, such as: Over-the-counter cold medicines. Cough suppressants. Coughing is a type of  defense against infection that helps to clear the respiratory system, so take these medicines only as recommended by your health care provider. Fever-reducing medicines. Follow these instructions at home: Activity Rest as needed. If you have a fever, stay home from work or school until your fever is gone or until your health care provider says your URI cannot spread to other people (is no longer contagious). Your health care provider may have you wear a face mask to prevent your infection from spreading. Relieving symptoms Gargle with a mixture of salt and water 3-4 times a day or as needed. To make salt water, completely dissolve -1 tsp (3-6 g) of salt in 1 cup (237 mL) of warm water. Use a cool-mist humidifier to add moisture to the air. This can help you breathe more easily. Eating and drinking  Drink enough fluid to keep your urine  pale yellow. Eat soups and other clear broths. General instructions  Take over-the-counter and prescription medicines only as told by your health care provider. These include cold medicines, fever reducers, and cough suppressants. Do not use any products that contain nicotine or tobacco. These products include cigarettes, chewing tobacco, and vaping devices, such as e-cigarettes. If you need help quitting, ask your health care provider. Stay away from secondhand smoke. Stay up to date on all immunizations, including the yearly (annual) flu vaccine. Keep all follow-up visits. This is important. How to prevent the spread of infection to others URIs can be contagious. To prevent the infection from spreading: Wash your hands with soap and water for at least 20 seconds. If soap and water are not available, use hand sanitizer. Avoid touching your mouth, face, eyes, or nose. Cough or sneeze into a tissue or your sleeve or elbow instead of into your hand or into the air.  Contact a health care provider if: You are getting worse instead of better. You have a fever  or chills. Your mucus is brown or red. You have yellow or brown discharge coming from your nose. You have pain in your face, especially when you bend forward. You have swollen neck glands. You have pain while swallowing. You have white areas in the back of your throat. Get help right away if: You have shortness of breath that gets worse. You have severe or persistent: Headache. Ear pain. Sinus pain. Chest pain. You have chronic lung disease along with any of the following: Making high-pitched whistling sounds when you breathe, most often when you breathe out (wheezing). Prolonged cough (more than 14 days). Coughing up blood. A change in your usual mucus. You have a stiff neck. You have changes in your: Vision. Hearing. Thinking. Mood. These symptoms may be an emergency. Get help right away. Call 911. Do not wait to see if the symptoms will go away. Do not drive yourself to the hospital. Summary An upper respiratory infection (URI) is a common infection of the nose, throat, and upper air passages that lead to the lungs. A URI is caused by a virus. URIs usually get better on their own within 7-10 days. Medicines cannot cure URIs, but your health care provider may recommend certain medicines to help relieve symptoms. This information is not intended to replace advice given to you by your health care provider. Make sure you discuss any questions you have with your health care provider. Document Revised: 01/11/2021 Document Reviewed: 01/11/2021 Elsevier Patient Education  2024 [employer]   If you have been instructed to have an in-person evaluation today at a local Urgent Care facility, please use the link below. It will take you to a list of all of our available [facility], including address, phone number and hours of operation. Please do not delay care.  [facility]  If you or a family member do not have a primary care provider, use the link below  to schedule a visit and establish care. When you choose a [facility] primary care physician or advanced practice provider, you gain a long-term partner in health. Find a Primary Care Provider  Learn more about [facility]'s in-office and virtual care options: [facility] - Get Care Now

## 2023-09-17 NOTE — Progress Notes (Signed)
 Virtual Visit Consent   Wesley Bridges, you are scheduled for a virtual visit with a [facility] provider today. Just as with appointments in the office, your consent must be obtained to participate. Your consent will be active for this visit and any virtual visit you may have with one of our providers in the next 365 days. If you have a MyChart account, a copy of this consent can be sent to you electronically.  As this is a virtual visit, video technology does not allow for your provider to perform a traditional examination. This may limit your provider's ability to fully assess your condition. If your provider identifies any concerns that need to be evaluated in person or the need to arrange testing (such as labs, EKG, etc.), we will make arrangements to do so. Although advances in technology are sophisticated, we cannot ensure that it will always work on either your end or our end. If the connection with a video visit is poor, the visit may have to be switched to a telephone visit. With either a video or telephone visit, we are not always able to ensure that we have a secure connection.  By engaging in this virtual visit, you consent to the provision of healthcare and authorize for your insurance to be billed (if applicable) for the services provided during this visit. Depending on your insurance coverage, you may receive a charge related to this service.  I need to obtain your verbal consent now. Are you willing to proceed with your visit today? Wesley Bridges has provided verbal consent on 09/17/2023 for a virtual visit (video or telephone). Margaretann Loveless, PA-C  Date: 09/17/2023 7:10 PM   Virtual Visit via Video Note   I, Margaretann Loveless, connected with  Wesley Bridges  ([id number], Jun 09, 1971) on 09/17/23 at  7:15 PM EDT by a video-enabled telemedicine application and verified that I am speaking with the correct person using two identifiers.  Location: Patient:  Virtual Visit Location Patient: Home Provider: Virtual Visit Location Provider: Home Office   I discussed the limitations of evaluation and management by telemedicine and the availability of in person appointments. The patient expressed understanding and agreed to proceed.    History of Present Illness: Wesley Bridges is a 53 y.o. who identifies as a male who was assigned male at birth, and is being seen today for URI symptoms.  HPI: URI  This is a new problem. The current episode started 1 to 4 weeks ago (just over a week). The problem has been gradually worsening. The maximum temperature recorded prior to his arrival was 100.4 - 100.9 F. The fever has been present for 1 to 2 days. Associated symptoms include congestion, coughing (discolored yellow chunks), diarrhea (in the beginning, but improved), ear pain (right), headaches, a plugged ear sensation (right), rhinorrhea, sinus pain and wheezing (bilateral base wheezing on auscultation from RN wife). Pertinent negatives include no chest pain, nausea, sore throat or vomiting. Treatments tried: nyquil. The treatment provided no relief.  Sick contacts at home, whole family    Problems:  Patient Active Problem List   Diagnosis Date Noted   Globus sensation 04/29/2023   Leukocytosis 04/29/2023   Allergic rhinitis 10/05/2022   Bradycardia 09/03/2022   Muscle cramp 09/03/2022   Cervical radiculopathy 02/19/2022   Right upper quadrant pain 02/19/2022   Hand pain 11/08/2021   Fingernail abnormalities 11/08/2021   Skin lesion 08/09/2021   Lipoma of left lower extremity 04/26/2021   Sciatica 04/26/2021  Lumbar radiculopathy 04/26/2021   De Quervain's tenosynovitis, bilateral 11/02/2020   Right knee pain 11/02/2020   Obesity (BMI 30.0-34.9) 10/07/2019   Onychomycosis 10/07/2019   Loose body of right elbow 08/07/2019   Complex tear of medial meniscus of left knee as current injury 03/23/2019   Degenerative joint disease of elbow, right  12/08/2018   OSA (obstructive sleep apnea) 11/05/2018   Prediabetes 11/05/2018   Chondromalacia of right patella 10/23/2018   Arthritis 07/22/2018   Right lateral epicondylitis 07/14/2018   Headache disorder 04/10/2018   Elevated urine levels of catecholamines 04/03/2018   Right elbow pain 04/03/2018   Right forearm numbness 03/05/2018   Low TSH level 02/18/2017   Major depression, single episode 09/16/2016   Palpitations 09/16/2016   Inguinal hernia 09/16/2016   Fibrosis of subtalar joint, right 07/16/2016   Sprain of right ankle 05/24/2016   Joint pain 04/18/2016   Enlarged prostate 03/22/2016   Lightheadedness 12/15/2015   Injury of toe on left foot 10/31/2015   Right ankle swelling 10/31/2015   Esophageal reflux 10/31/2015   Hyperlipidemia 04/13/2015   Essential hypertension 03/18/2015   Chest pain 03/18/2015    Allergies:  Allergies  Allergen Reactions   Bee Pollen Other (See Comments) and Cough    Cough runny eyes   Short Ragweed Pollen Ext Other (See Comments)   Pollen Extract    Medications:  Current Outpatient Medications:    albuterol (VENTOLIN HFA) 108 (90 Base) MCG/ACT inhaler, Inhale 1-2 puffs into the lungs every 6 (six) hours as needed., Disp: 8 g, Rfl: 0   amoxicillin-clavulanate (AUGMENTIN) 875-125 MG tablet, Take 1 tablet by mouth 2 (two) times daily., Disp: 20 tablet, Rfl: 0   benzonatate (TESSALON) 100 MG capsule, Take 1-2 capsules (100-200 mg total) by mouth 3 (three) times daily as needed., Disp: 30 capsule, Rfl: 0   promethazine-dextromethorphan (PROMETHAZINE-DM) 6.25-15 MG/5ML syrup, Take 5 mLs by mouth 4 (four) times daily as needed., Disp: 118 mL, Rfl: 0   amLODipine (NORVASC) 5 MG tablet, Take 1 tablet (5 mg total) by mouth daily., Disp: 90 tablet, Rfl: 3   aspirin EC 81 MG tablet, Take 81 mg by mouth daily., Disp: , Rfl:    atorvastatin (LIPITOR) 80 MG tablet, TAKE 1 TABLET DAILY, Disp: 90 tablet, Rfl: 3   azelastine (ASTELIN) 0.1 % nasal spray,  USE 2 SPRAYS IN EACH NOSTRIL TWICE DAILY AS DIRECTED, Disp: 120 mL, Rfl: 3   fluticasone (FLONASE) 50 MCG/ACT nasal spray, USE 2 SPRAYS IN EACH NOSTRIL DAILY, Disp: 48 g, Rfl: 3   loratadine (CLARITIN) 10 MG tablet, Take 1 tablet (10 mg total) by mouth daily. (Patient taking differently: Take 20 mg by mouth daily.), Disp: 30 tablet, Rfl: 11   meloxicam (MOBIC) 15 MG tablet, TAKE 1 TABLET DAILY AS NEEDED FOR PAIN, Disp: 90 tablet, Rfl: 1   Multiple Vitamin (MULTIVITAMIN WITH MINERALS) TABS tablet, Take 1 tablet by mouth at bedtime., Disp: , Rfl:    nebivolol (BYSTOLIC) 2.5 MG tablet, Take 1 tablet (2.5 mg total) by mouth daily., Disp: 90 tablet, Rfl: 3   nitroGLYCERIN (NITROSTAT) 0.4 MG SL tablet, Place 1 tablet (0.4 mg total) under the tongue every 5 (five) minutes as needed for chest pain., Disp: 25 tablet, Rfl: 0   pantoprazole (PROTONIX) 40 MG tablet, Take 1 tablet (40 mg total) by mouth 2 (two) times daily., Disp: 180 tablet, Rfl: 1   topiramate (TOPAMAX) 100 MG tablet, Take 1 tablet (100 mg total) by mouth 2 (two)  times daily., Disp: 180 tablet, Rfl: 3  Observations/Objective: Patient is well-developed, well-nourished in no acute distress.  Resting comfortably at home.  Head is normocephalic, atraumatic.  No labored breathing.  Speech is clear and coherent with logical content.  Patient is alert and oriented at baseline.    Assessment and Plan: 1. Bacterial upper respiratory infection (Primary) - amoxicillin-clavulanate (AUGMENTIN) 875-125 MG tablet; Take 1 tablet by mouth 2 (two) times daily.  Dispense: 20 tablet; Refill: 0 - albuterol (VENTOLIN HFA) 108 (90 Base) MCG/ACT inhaler; Inhale 1-2 puffs into the lungs every 6 (six) hours as needed.  Dispense: 8 g; Refill: 0 - promethazine-dextromethorphan (PROMETHAZINE-DM) 6.25-15 MG/5ML syrup; Take 5 mLs by mouth 4 (four) times daily as needed.  Dispense: 118 mL; Refill: 0 - benzonatate (TESSALON) 100 MG capsule; Take 1-2 capsules (100-200  mg total) by mouth 3 (three) times daily as needed.  Dispense: 30 capsule; Refill: 0  - Worsening over a week despite OTC medications - Will treat with Augmentin, Promethazine DM, and tessalon perles - Can continue Mucinex (PLAIN) - Albuterol for wheezing, chest tightness and shortness of breath - Push fluids.  - Rest.  - Steam and humidifier can help - Seek in person evaluation if worsening or symptoms fail to improve    Follow Up Instructions: I discussed the assessment and treatment plan with the patient. The patient was provided an opportunity to ask questions and all were answered. The patient agreed with the plan and demonstrated an understanding of the instructions.  A copy of instructions were sent to the patient via MyChart unless otherwise noted below.    The patient was advised to call back or seek an in-person evaluation if the symptoms worsen or if the condition fails to improve as anticipated.    Margaretann Loveless, PA-C

## 2023-09-19 ENCOUNTER — Telehealth: Payer: Self-pay | Admitting: Family

## 2023-09-19 NOTE — Telephone Encounter (Signed)
 Call pt  Received psychology evaluation from [facility]  I am quite concerned as a relates to depression and anxiety.  Please schedule appointment with me as soon as possible.  His initial transfer of care in May.  Please move this up.  He will require 30-minute transfer care

## 2023-09-19 NOTE — Telephone Encounter (Signed)
 Spoke to pot went over note and scheduled TOC for 10/03/23

## 2023-10-03 ENCOUNTER — Other Ambulatory Visit (HOSPITAL_COMMUNITY): Payer: Self-pay

## 2023-10-03 ENCOUNTER — Other Ambulatory Visit: Payer: Self-pay | Admitting: Family

## 2023-10-03 ENCOUNTER — Ambulatory Visit (INDEPENDENT_AMBULATORY_CARE_PROVIDER_SITE_OTHER): Admitting: Family

## 2023-10-03 ENCOUNTER — Telehealth: Payer: Self-pay

## 2023-10-03 VITALS — BP 130/78 | HR 66 | Temp 97.6°F | Ht 67.0 in | Wt 204.8 lb

## 2023-10-03 DIAGNOSIS — Z136 Encounter for screening for cardiovascular disorders: Secondary | ICD-10-CM | POA: Diagnosis not present

## 2023-10-03 DIAGNOSIS — G4733 Obstructive sleep apnea (adult) (pediatric): Secondary | ICD-10-CM

## 2023-10-03 DIAGNOSIS — R7303 Prediabetes: Secondary | ICD-10-CM | POA: Diagnosis not present

## 2023-10-03 DIAGNOSIS — M5412 Radiculopathy, cervical region: Secondary | ICD-10-CM | POA: Diagnosis not present

## 2023-10-03 DIAGNOSIS — N400 Enlarged prostate without lower urinary tract symptoms: Secondary | ICD-10-CM

## 2023-10-03 DIAGNOSIS — I10 Essential (primary) hypertension: Secondary | ICD-10-CM

## 2023-10-03 DIAGNOSIS — Z1159 Encounter for screening for other viral diseases: Secondary | ICD-10-CM

## 2023-10-03 DIAGNOSIS — Z13220 Encounter for screening for lipoid disorders: Secondary | ICD-10-CM

## 2023-10-03 DIAGNOSIS — Z125 Encounter for screening for malignant neoplasm of prostate: Secondary | ICD-10-CM

## 2023-10-03 DIAGNOSIS — Z114 Encounter for screening for human immunodeficiency virus [HIV]: Secondary | ICD-10-CM

## 2023-10-03 LAB — COMPREHENSIVE METABOLIC PANEL WITH GFR
ALT: 26 U/L (ref 0–53)
AST: 21 U/L (ref 0–37)
Albumin: 4.2 g/dL (ref 3.5–5.2)
Alkaline Phosphatase: 84 U/L (ref 39–117)
BUN: 17 mg/dL (ref 6–23)
CO2: 24 meq/L (ref 19–32)
Calcium: 9.2 mg/dL (ref 8.4–10.5)
Chloride: 113 meq/L — ABNORMAL HIGH (ref 96–112)
Creatinine, Ser: 1.12 mg/dL (ref 0.40–1.50)
GFR: 74.67 mL/min (ref >60.00)
Glucose, Bld: 105 mg/dL — ABNORMAL HIGH (ref 70–99)
Potassium: 4.3 meq/L (ref 3.5–5.1)
Sodium: 143 meq/L (ref 135–145)
Total Bilirubin: 0.4 mg/dL (ref 0.2–1.2)
Total Protein: 6.8 g/dL (ref 6.0–8.3)

## 2023-10-03 LAB — LIPID PANEL
Cholesterol: 157 mg/dL (ref 0–200)
HDL: 49.1 mg/dL (ref >39.00)
LDL Cholesterol: 90 mg/dL (ref 0–99)
NonHDL: 107.53
Total CHOL/HDL Ratio: 3
Triglycerides: 89 mg/dL (ref 0.0–149.0)
VLDL: 17.8 mg/dL (ref 0.0–40.0)

## 2023-10-03 LAB — HEMOGLOBIN A1C: Hgb A1c MFr Bld: 5.6 % (ref 4.6–6.5)

## 2023-10-03 LAB — B12 AND FOLATE PANEL
Folate: 13 ng/mL (ref >5.9)
Vitamin B-12: 256 pg/mL (ref 211–911)

## 2023-10-03 LAB — PSA: PSA: 1.26 ng/mL (ref 0.10–4.00)

## 2023-10-03 LAB — TSH: TSH: 1.45 u[IU]/mL (ref 0.35–5.50)

## 2023-10-03 MED ORDER — TIRZEPATIDE-WEIGHT MANAGEMENT 2.5 MG/0.5ML ~~LOC~~ SOLN: 2.5000 mg | SUBCUTANEOUS | 2 refills | Status: DC

## 2023-10-03 NOTE — Telephone Encounter (Signed)
 LVM  INFORMING PT OF MESSAGE BELOW AND SENT MY CHART Received notification from [employer] that Prior Authorization for Zepbound 2.5MG /0.5ML pen-injectors  has been APPROVED from 10/03/23 to 06/24/24. Ran test claim, Copay is $622.71. This test claim was processed through [facility]- copay amounts may vary at other pharmacies due to pharmacy/plan contracts, or as the patient moves through the different stages of their insurance plan.    PA #/Case ID/Reference #: [id number]

## 2023-10-03 NOTE — Telephone Encounter (Signed)
 Pharmacy Patient Advocate Encounter   Received notification from RX Request Messages that prior authorization for Zepbound 2.5MG /0.5ML pen-injectors is required/requested.   Insurance verification completed.   The patient is insured through [employer] .   Per test claim: PA required; PA submitted to above mentioned insurance via CoverMyMeds Key/confirmation #/EOC [id number] Status is pending

## 2023-10-03 NOTE — Patient Instructions (Signed)
 We discussed trial of Zepbound for the treatment of sleep apnea.  Trial Zepbound 2.5mg  once per week injected subcutaneously ( [place])  in stomach. Please clean with alcohol swab prior to injection and be sure to rotate site. You may schedule a nurse visit if you would like to first injection.   After 4 weeks, and if tolerated and weight loss has not reached 1-2 lbs per week, please increase to 5mg  once per week [place].    Please read information on medication below and remember black box warning that you may not take if you or a family member is diagnosed with thyroid cancer (medullary thyroid cancer), or multiple endocrine neoplasia.      Tirzepatide Injection (Weight Management) What is this medication? TIRZEPATIDE (tir ZEP a tide) promotes weight loss. It may also be used to maintain weight loss.  It works by decreasing appetite. Changes to diet and exercise are often combined with this medication. This medicine may be used for other purposes; ask your health care provider or pharmacist if you have questions. COMMON BRAND NAME(S): Zepbound What should I tell my care team before I take this medication? They need to know if you have any of these conditions: Diabetes Eye disease caused by diabetes Gallbladder disease Have or have had depression Have or have had pancreatitis Having surgery Kidney disease Personal or family history of MEN 2, a condition that causes endocrine gland tumors Personal or family history of thyroid cancer Stomach or intestine problems, such as problems digesting food Suicidal thoughts, plans, or attempt An unusual or allergic reaction to tirzepatide, other medications, foods, dyes, or preservatives Pregnant or trying to get pregnant Breastfeeding How should I use this medication? This medication is injected under the skin. You will be taught how to prepare and give it. Take it as directed on the prescription label. Keep taking it unless your care team tells you to  stop. It is important that you put your used needles and syringes in a special sharps container. Do not put them in a trash can. If you do not have a sharps container, call your pharmacist or care team to get one. A special MedGuide will be given to you by the pharmacist with each prescription and refill. Be sure to read this information carefully each time. This medication comes with INSTRUCTIONS FOR USE. Ask your pharmacist for directions on how to use this medication. Read the information carefully. Talk to your pharmacist or care team if you have questions. Talk to your care team about the use of this medication in children. Special care may be needed. Overdosage: If you think you have taken too much of this medicine contact a poison control center or emergency room at once. NOTE: This medicine is only for you. Do not share this medicine with others. What if I miss a dose? If you miss a dose, take it as soon as you can unless it is more than 4 days (96 hours) late. If it is more than 4 days late, skip the missed dose. Take the next dose at the normal time. Do not take 2 doses within 3 days (72 hours) of each other. What may interact with this medication? Certain medications for diabetes, such as insulin, glyburide, glipizide This medication may affect how other medications work. Talk with your care team about all of the medications you take. They may suggest changes to your treatment plan to lower the risk of side effects and to make sure your medications work as  intended. This list may not describe all possible interactions. Give your health care provider a list of all the medicines, herbs, non-prescription drugs, or dietary supplements you use. Also tell them if you smoke, drink alcohol, or use illegal drugs. Some items may interact with your medicine. What should I watch for while using this medication? Visit your care team for regular checks on your progress. Tell your care team if your  condition does not start to get better or if it gets worse. Tell your care team if you are taking medication to treat diabetes, such as insulin or glipizide. This may increase your risk of low blood sugar. Know the symptoms of low blood sugar and how to treat it. Talk to your care team about your risk of cancer. You may be more at risk for certain types of cancer if you take this medication. Talk to your care team right away if you have a lump or swelling in your neck, hoarseness that does not go away, trouble swallowing, shortness of breath, or trouble breathing. Make sure you stay hydrated while taking this medication. Drink water often. Eat fruits and veggies that have a high water content. Drink more water when it is hot or you are active. Talk to your care team right away if you have fever, infection, vomiting, diarrhea, or if you sweat a lot while taking this medication. The loss of too much body fluid may make it dangerous for you to take this medication. If you are going to need surgery or a procedure, tell your care team that you are taking this medication. Estrogen and progestin hormones that you take by mouth may not work as well while you are taking this medication. Switch to a non-oral contraceptive or add a barrier contraceptive for 4 weeks after starting this medication and after each dose increase. Talk to your care team about contraceptive options. They can help you find the option that works for you. What side effects may I notice from receiving this medication? Side effects that you should report to your care team as soon as possible: Allergic reactions or angioedema--skin rash, itching or hives, swelling of the face, eyes, lips, tongue, arms, or legs, trouble swallowing or breathing Bowel blockage--stomach cramping, unable to have a bowel movement or pass gas, loss of appetite, vomiting Change in vision Dehydration--increased thirst, dry mouth, feeling faint or lightheaded, headache,  dark yellow or brown urine Gallbladder problems--severe stomach pain, nausea, vomiting, fever Kidney injury--decrease in the amount of urine, swelling of the ankles, hands, or feet Pancreatitis--severe stomach pain that spreads to your back or gets worse after eating or when touched, fever, nausea, vomiting Thoughts of suicide or self-harm, worsening mood, feelings of depression Thyroid cancer--new mass or lump in the neck, pain or trouble swallowing, trouble breathing, hoarseness Side effects that usually do not require medical attention (report these to your care team if they continue or are bothersome): Diarrhea Loss of appetite Nausea Upset stomach This list may not describe all possible side effects. Call your doctor for medical advice about side effects. You may report side effects to FDA at 1-800-FDA-1088. Where should I keep my medication? Keep out of the reach of children and pets. Store in a refrigerator or at room temperature up to 30 degrees C (86 degrees F). Keep it in the original container. Protect from light. Refrigeration (preferred): Store in the refrigerator. Do not freeze. Get rid of any unused medication after the expiration date. Room temperature: This medication may be  stored at room temperature for up to 21 days. If it is stored at room temperature, get rid of any unused medication after 21 days or after it expires, whichever is first. To get rid of medications that are no longer needed or have expired: Take the medication to a medication take-back program. Check with your pharmacy or law enforcement to find a location. If you cannot return the medication, ask your pharmacist or care team how to get rid of this medication safely. NOTE: This sheet is a summary. It may not cover all possible information. If you have questions about this medicine, talk to your doctor, pharmacist, or health care provider.  2024 Elsevier/Gold Standard (2023-05-24 00:00:00)

## 2023-10-03 NOTE — Assessment & Plan Note (Signed)
 Noncompliant with cipap.   Discussed risk of not treating sleep apnea including stroke, heart attack, heart arrhythmia.  He was agreeable to try Zepbound.  Counseled on blackbox warning, mechanism of action, titration.  Follow-up in 1 month.

## 2023-10-03 NOTE — Telephone Encounter (Signed)
 PA has been submitted and documented in separate encounter, please sign off on rx in this encounter as PA team is unable to resolve RX requests. Thank you

## 2023-10-03 NOTE — Assessment & Plan Note (Signed)
 Chronic, stable.  Continue amlodipine 5 mg, Bystolic 2.5 mg BID.

## 2023-10-03 NOTE — Telephone Encounter (Signed)
 Pharmacy Patient Advocate Encounter  Received notification from [facility] that Prior Authorization for Zepbound 2.5MG /0.5ML pen-injectors  has been APPROVED from 10/03/23 to 06/24/24. Ran test claim, Copay is $622.71. This test claim was processed through [facility]- copay amounts may vary at other pharmacies due to pharmacy/plan contracts, or as the patient moves through the different stages of their insurance plan.   PA #/Case ID/Reference #: [id number]

## 2023-10-03 NOTE — Progress Notes (Signed)
 Assessment & Plan:  OSA (obstructive sleep apnea) Assessment & Plan: Noncompliant with cipap.   Discussed risk of not treating sleep apnea including stroke, heart attack, heart arrhythmia.  He was agreeable to try Zepbound.  Counseled on blackbox warning, mechanism of action, titration.  Follow-up in 1 month.   Orders: -     Tirzepatide-Weight Management; Inject 2.5 mg into the skin once a week.  Dispense: 2 mL; Refill: 2  Essential hypertension Assessment & Plan: Chronic, stable.  Continue amlodipine 5 mg, Bystolic 2.5 mg BID.   Orders: -     Comprehensive metabolic panel with GFR -     TSH  Prediabetes -     Lipid panel -     Hemoglobin A1c  Encounter for lipid screening for cardiovascular disease -     Lipid panel  Encounter for screening for HIV -     HIV Antibody (routine testing w rflx)  Encounter for hepatitis C screening test for low risk patient -     Hepatitis C antibody  Cervical radiculopathy -     B12 and Folate Panel  Screening for prostate cancer -     PSA  Enlarged prostate -     PSA     Return precautions given.   Risks, benefits, and alternatives of the medications and treatment plan prescribed today were discussed, and patient expressed understanding.   Education regarding symptom management and diagnosis given to patient on AVS either electronically or printed.  Return in about 1 month (around 11/02/2023).  Rennie Plowman, FNP  Subjective:    Patient ID: Wesley Bridges, male    DOB: [birth year], 53 y.o.   MRN: [id number]  CC: Wesley Bridges is a 53 y.o. male who presents today for follow up, transfer of care   HPI: Feels well today No new complaints.   Exercise limited by chronic neck pain. He does light house work.   Denies CP, SOB.    H/o HA. He takes topamax 100mg  BID which has kept HA overall controlled. No longer having migraines.    He has chronic bilateral numbness and pain. He has deferred surgery at this  time.   He takes mobic 15mg  prn.   Follows with [facility] spine neurosurgery for myelopathy, cervical stenosis  He also follows with [facility] pain management Dr Oda Kilts whom started him on Effexor. He has follow up with pain management tomorrow.  Compliant with lyrica 100mg  TID  Denies decreased urine stream, urinary frequency.   No h/o MTC, pancreatitis, MEN.   He is noncompliant with his CPAP machine.  Difficult to wear and dries his nose out.   Allergies: Bee pollen, Short ragweed pollen ext, and Pollen extract Current Outpatient Medications on File Prior to Visit  Medication Sig Dispense Refill   albuterol (VENTOLIN HFA) 108 (90 Base) MCG/ACT inhaler Inhale 1-2 puffs into the lungs every 6 (six) hours as needed. 8 g 0   amLODipine (NORVASC) 5 MG tablet Take 1 tablet (5 mg total) by mouth daily. 90 tablet 3   aspirin EC 81 MG tablet Take 81 mg by mouth daily.     atorvastatin (LIPITOR) 80 MG tablet TAKE 1 TABLET DAILY 90 tablet 3   azelastine (ASTELIN) 0.1 % nasal spray USE 2 SPRAYS IN EACH NOSTRIL TWICE DAILY AS DIRECTED 120 mL 3   fluticasone (FLONASE) 50 MCG/ACT nasal spray USE 2 SPRAYS IN EACH NOSTRIL DAILY 48 g 3   loratadine (CLARITIN) 10 MG tablet Take 1 tablet (10  mg total) by mouth daily. (Patient taking differently: Take 20 mg by mouth daily.) 30 tablet 11   meloxicam (MOBIC) 15 MG tablet TAKE 1 TABLET DAILY AS NEEDED FOR PAIN 90 tablet 1   Multiple Vitamin (MULTIVITAMIN WITH MINERALS) TABS tablet Take 1 tablet by mouth at bedtime.     nebivolol (BYSTOLIC) 2.5 MG tablet Take 1 tablet (2.5 mg total) by mouth daily. 90 tablet 3   nitroGLYCERIN (NITROSTAT) 0.4 MG SL tablet Place 1 tablet (0.4 mg total) under the tongue every 5 (five) minutes as needed for chest pain. 25 tablet 0   pantoprazole (PROTONIX) 40 MG tablet Take 1 tablet (40 mg total) by mouth 2 (two) times daily. 180 tablet 1   pregabalin (LYRICA) 100 MG capsule Take by mouth.     topiramate (TOPAMAX) 100 MG tablet Take 1  tablet (100 mg total) by mouth 2 (two) times daily. 180 tablet 3   venlafaxine (EFFEXOR) 75 MG tablet Take 75 mg by mouth daily.     No current facility-administered medications on file prior to visit.    Review of Systems  Constitutional:  Negative for chills and fever.  Respiratory:  Negative for cough.   Cardiovascular:  Negative for chest pain and palpitations.  Gastrointestinal:  Negative for nausea and vomiting.  Genitourinary:  Negative for decreased urine volume and difficulty urinating.  Musculoskeletal:  Positive for back pain and neck pain.  Neurological:  Positive for numbness.      Objective:    BP 130/78   Pulse 66   Temp 97.6 F (36.4 C) (Oral)   Ht 5\' 7"  (1.702 m)   Wt 204 lb 12.8 oz (92.9 kg)   SpO2 99%   BMI 32.08 kg/m  BP Readings from Last 3 Encounters:  10/03/23 130/78  04/29/23 130/82  11/26/22 118/78   Wt Readings from Last 3 Encounters:  10/03/23 204 lb 12.8 oz (92.9 kg)  04/29/23 201 lb (91.2 kg)  11/26/22 196 lb (88.9 kg)    Physical Exam Vitals reviewed.  Constitutional:      Appearance: He is well-developed.  Neck:     Thyroid: No thyroid mass, thyromegaly or thyroid tenderness.  Cardiovascular:     Rate and Rhythm: Regular rhythm.     Heart sounds: Normal heart sounds.  Pulmonary:     Effort: Pulmonary effort is normal. No respiratory distress.     Breath sounds: Normal breath sounds. No wheezing, rhonchi or rales.  Skin:    General: Skin is warm and dry.  Neurological:     Mental Status: He is alert.  Psychiatric:        Speech: Speech normal.        Behavior: Behavior normal.

## 2023-10-04 LAB — HIV ANTIBODY (ROUTINE TESTING W REFLEX): HIV 1&2 Ab, 4th Generation: NONREACTIVE

## 2023-10-04 LAB — HEPATITIS C ANTIBODY: Hepatitis C Ab: NONREACTIVE

## 2023-10-04 NOTE — Telephone Encounter (Signed)
 Spoke to pt informed him of approval  for Zepbound pens made pt aware of co pay as well and pt verbalized understanding

## 2023-10-15 ENCOUNTER — Other Ambulatory Visit: Payer: Self-pay

## 2023-10-15 MED ORDER — TIRZEPATIDE-WEIGHT MANAGEMENT 2.5 MG/0.5ML ~~LOC~~ SOAJ: 2.5000 mg | SUBCUTANEOUS | 0 refills | Status: DC

## 2023-10-15 NOTE — Telephone Encounter (Signed)
 Spoke to pt and called the pharmacy and the Zepbound  pens are ready for pickup

## 2023-10-17 ENCOUNTER — Other Ambulatory Visit: Payer: Self-pay | Admitting: Family

## 2023-10-17 ENCOUNTER — Encounter: Payer: Self-pay | Admitting: Family

## 2023-10-17 DIAGNOSIS — E538 Deficiency of other specified B group vitamins: Secondary | ICD-10-CM

## 2023-10-24 ENCOUNTER — Encounter: Payer: Self-pay | Admitting: Family

## 2023-10-24 ENCOUNTER — Other Ambulatory Visit: Payer: Self-pay | Admitting: Family

## 2023-10-24 NOTE — Progress Notes (Signed)
 close

## 2023-10-30 ENCOUNTER — Other Ambulatory Visit (INDEPENDENT_AMBULATORY_CARE_PROVIDER_SITE_OTHER)

## 2023-10-30 DIAGNOSIS — E538 Deficiency of other specified B group vitamins: Secondary | ICD-10-CM | POA: Diagnosis not present

## 2023-11-02 LAB — METHYLMALONIC ACID, SERUM: Methylmalonic Acid, Quant: 107 nmol/L (ref 55–335)

## 2023-11-02 LAB — HOMOCYSTEINE: Homocysteine: 10.1 umol/L (ref <11.4)

## 2023-11-02 LAB — INTRINSIC FACTOR ANTIBODIES: Intrinsic Factor: NEGATIVE

## 2023-11-03 LAB — CELIAC DISEASE AB SCREEN W/RFX
Antigliadin Abs, IgA: 57 U — ABNORMAL HIGH (ref 0–19)
IgA/Immunoglobulin A, Serum: 334 mg/dL (ref 90–386)
Transglutaminase IgA: <2 U/mL (ref 0–3)

## 2023-11-04 ENCOUNTER — Ambulatory Visit: Admitting: Family

## 2023-11-07 ENCOUNTER — Encounter: Payer: Medicare Other | Admitting: Family

## 2023-11-11 ENCOUNTER — Encounter: Payer: Self-pay | Admitting: Family

## 2023-11-11 ENCOUNTER — Ambulatory Visit (INDEPENDENT_AMBULATORY_CARE_PROVIDER_SITE_OTHER): Admitting: Family

## 2023-11-11 ENCOUNTER — Ambulatory Visit: Payer: Self-pay | Admitting: Family

## 2023-11-11 VITALS — BP 128/78 | HR 78 | Temp 98.7°F | Ht 67.0 in | Wt 205.0 lb

## 2023-11-11 DIAGNOSIS — K900 Celiac disease: Secondary | ICD-10-CM | POA: Insufficient documentation

## 2023-11-11 DIAGNOSIS — E66811 Obesity, class 1: Secondary | ICD-10-CM | POA: Diagnosis not present

## 2023-11-11 DIAGNOSIS — M5412 Radiculopathy, cervical region: Secondary | ICD-10-CM | POA: Diagnosis not present

## 2023-11-11 DIAGNOSIS — J40 Bronchitis, not specified as acute or chronic: Secondary | ICD-10-CM | POA: Insufficient documentation

## 2023-11-11 DIAGNOSIS — G4733 Obstructive sleep apnea (adult) (pediatric): Secondary | ICD-10-CM

## 2023-11-11 DIAGNOSIS — R519 Headache, unspecified: Secondary | ICD-10-CM

## 2023-11-11 DIAGNOSIS — E538 Deficiency of other specified B group vitamins: Secondary | ICD-10-CM | POA: Insufficient documentation

## 2023-11-11 MED ORDER — METFORMIN HCL ER 500 MG PO TB24: 500.0000 mg | ORAL_TABLET | Freq: Every evening | ORAL | 2 refills | Status: DC

## 2023-11-11 MED ORDER — PROMETHAZINE-DM 6.25-15 MG/5ML PO SYRP
5.0000 mL | ORAL_SOLUTION | Freq: Three times a day (TID) | ORAL | 0 refills | Status: DC | PRN
Start: 2023-11-11 — End: 2024-01-14

## 2023-11-11 MED ORDER — PREGABALIN 100 MG PO CAPS: 100.0000 mg | ORAL_CAPSULE | Freq: Three times a day (TID) | ORAL | 2 refills | Status: DC

## 2023-11-11 NOTE — Assessment & Plan Note (Addendum)
 Duration 3 days. No acute respiratory distress. Discussed more likely viral in etiology. Advised conservative management with mucinex ; provided promethazine  DM refill as previously helpful for cough suppression.Advised not to use  mucinex DM with promethazine  DM.

## 2023-11-11 NOTE — Assessment & Plan Note (Addendum)
 Chronic, at baseline. Continue lyrica  100mg  TID ; he would like to resume prescription with me. I have refilled.  Completed [facility].  Follow up in 3 months.

## 2023-11-11 NOTE — Patient Instructions (Addendum)
 For cough and sinus pressure, I suspect viral etiology  You may use PLAIN mucinex if you are using promethazine  DM ; do not use mucinex DM and promethazine  DM as you would not require both medications as they both have dextromethorphan (DM) which is a cough suppressant  Let me know if symptoms worsen or persist    Please download Myfitness Pal App ( basic version is free).   You may log every thing you eat for even 2-3 days to get a better of idea of total daily calories. To loose weight, we have to create caloric deficit to loose weight. The goal is 1-2 lbs per week of weight loss.   Excellent article below from [facility].   [email or web address].it  Calorie counting made easy  Eat less, exercise more. If only it were that simple! As most dieters know, losing weight can be very challenging. As this report details, a range of influences can affect how people gain and lose weight. But a basic understanding of how to tip your energy balance in favor of weight loss is a good place to start.  Start by determining how many calories you should consume each day. To do so, you need to know how many calories you need to maintain your current weight. Doing this requires a few simple calculations.  First, multiply your current weight by 15 -- that's roughly the number of calories per pound of body weight needed to maintain your current weight if you are moderately active. Moderately active means getting at least 30 minutes of physical activity a day in the form of exercise (walking at a brisk pace, climbing stairs, or active gardening). Let's say you're a woman who is 5 feet, 4 inches tall and weighs 155 pounds, and you need to lose about 15 pounds to put you in a healthy weight range. If you multiply 155 by 15, you will get 2,325, which is the number of calories per day that you need in order to maintain your current weight (weight-maintenance  calories). To lose weight, you will need to get below that total.  For example, to lose 1 to 2 pounds a week -- a rate that experts consider safe -- your food consumption should provide 500 to 1,000 calories less than your total weight-maintenance calories. If you need 2,325 calories a day to maintain your current weight, reduce your daily calories to between 1,325 and 1,825. If you are sedentary, you will also need to build more activity into your day. In order to lose at least a pound a week, try to do at least 30 minutes of physical activity on most days, and reduce your daily calorie intake by at least 500 calories. However, calorie intake should not fall below 1,200 a day in women or 1,500 a day in men, except under the supervision of a health professional. Eating too few calories can endanger your health by depriving you of needed nutrients.  Meeting your calorie target How can you meet your daily calorie target? One approach is to add up the number of calories per serving of all the foods that you eat, and then plan your menus accordingly. You can buy books that list calories per serving for many foods. In addition, the nutrition labels on all packaged foods and beverages provide calories per serving information. Make a point of reading the labels of the foods and drinks you use, noting the number of calories and the serving sizes. Many recipes published in cookbooks, newspapers, and magazines  provide similar information.  If you hate counting calories, a different approach is to restrict how much and how often you eat, and to eat meals that are low in calories. Dietary guidelines issued by the American Heart Association stress common sense in choosing your foods rather than focusing strictly on numbers, such as total calories or calories from fat. Whichever method you choose, research shows that a regular eating schedule -- with meals and snacks planned for certain times each day -- makes for the most  successful approach. The same applies after you have lost weight and want to keep it off. Sticking with an eating schedule increases your chance of maintaining your new weight.    This is  Dr. Tullo's  ( an amazing physician in my office!)  example of a  "Low GI"  Diet:  It will allow you to lose 4 to 8  lbs  per month if you follow it carefully.  Your goal with exercise is a minimum of 30 minutes of aerobic exercise 5 days per week (Walking does not count once it becomes easy!)    All of the foods can be found at grocery stores and in bulk at [employer].  The Atkins protein bars and shakes are available in more varieties at [employer], [employer] and [employer].     7 AM Breakfast:  Choose from the following:  Low carbohydrate Protein  Shakes (I recommend the  Premier Protein chocolate shakes,  EAS AdvantEdge "Carb Control" shakes  Or the Atkins shakes all are under 3 net carbs)     a scrambled egg/bacon/cheese burrito made with Mission's "carb balance" whole wheat tortilla  (about 10 net carbs )  Medical laboratory scientific officer (basically a quiche without the pastry crust) that is eaten cold and very convenient way to get your eggs.  8 carbs)  If you make your own protein shakes, avoid bananas and pineapple,  And use low carb greek yogurt or original /unsweetened almond or soy milk    Avoid cereal and bananas, oatmeal and cream of wheat and grits. They are loaded with carbohydrates!   10 AM: high protein snack:  Protein bar by Atkins (the snack size, under 200 cal, usually < 6 net carbs).    A stick of cheese:  Around 1 carb,  100 cal     Dannon Light n Fit [place] Yogurt  (80 cal, 8 carbs)  Other so called "protein bars" and Greek yogurts tend to be loaded with carbohydrates.  Remember, in food advertising, the word "energy" is synonymous for " carbohydrate."  Lunch:   A Sandwich using the bread choices listed, Can use any  Eggs,  lunchmeat, grilled meat or canned tuna), avocado, regular  mayo/mustard  and cheese.  A Salad using blue cheese, ranch,  Goddess or vinagrette,  Avoid taco shells, croutons or "confetti" and no "candied nuts" but regular nuts OK.   No pretzels, nabs  or chips.  Pickles and miniature sweet peppers are a good low carb alternative that provide a "crunch"  The bread is the only source of carbohydrate in a sandwich and  can be decreased by trying some of the attached alternatives to traditional loaf bread   Avoid "Low fat dressings, as well as Alex Andrew and [employer] dressings They are loaded with sugar!   3 PM/ Mid day  Snack:  Consider  1 ounce of  almonds, walnuts, pistachios, pecans, peanuts,  Macadamia nuts or a nut medley.  Avoid "  granola and granola bars "  Mixed nuts are ok in moderation as long as there are no raisins,  cranberries or dried fruit.   KIND bars are OK if you get the low glycemic index variety   Try the prosciutto/mozzarella cheese sticks by Fiorruci  In deli /backery section   High protein      6 PM  Dinner:     Meat/fowl/fish with a green salad, and either broccoli, cauliflower, green beans, spinach, brussel sprouts or  Lima beans. DO NOT BREAD THE PROTEIN!!      There is a low carb pasta by Dreamfield's that is acceptable and tastes great: only 5 digestible carbs/serving.( All grocery stores but BJs carry it ) Several ready made meals are available low carb:   Try Michel Angelo's chicken piccata or chicken or eggplant parm over low carb pasta.(Lowes and BJs)   Thurston Flow Sanchez's "Carnitas" (pulled pork, no sauce,  0 carbs) or his beef pot roast to make a dinner burrito (at [employer]J's)  Pesto over low carb pasta (bj's sells a good quality pesto in the center refrigerated section of the deli   Try satueeing  Vanessa General with mushroooms as a good side   [employer] Giant makes a mashed cauliflower that tastes like mashed potatoes  Whole wheat pasta is still full of digestible carbs and  Not as low in glycemic index as Dreamfield's.   Brown  rice is still rice,  So skip the rice and noodles if you eat [place] or [place] (or at least limit to 1/2 cup)  9 PM snack :   Breyer's "low carb" fudgsicle or  ice cream bar (Carb Smart line), or  Weight Watcher's ice cream bar , or another "no sugar added" ice cream;  a serving of fresh berries/cherries with whipped cream   Cheese or DANNON'S LlGHT N FIT GREEK YOGURT  8 ounces of [place] unsweetened almond/cococunut milk    Treat yourself to a parfait made with whipped cream blueberiies, walnuts and vanilla greek yogurt  Avoid bananas, pineapple, grapes  and watermelon on a regular basis because they are high in sugar.  THINK OF THEM AS DESSERT  Remember that snack Substitutions should be less than 10 NET carbs per serving and meals < 20 carbs. Remember to subtract fiber grams to get the "net carbs."  Celiac Disease  Celiac disease is a condition in which the body's disease-fighting system (immune system) attacks the small intestine and causes damage. This is known as an autoimmune disease. When a person with celiac disease eats a food that has gluten in it, the immune system attacks the cells that line the small intestine. Over time, this reaction damages the small intestine and makes the small intestine unable to absorb nutrients from food. Gluten is found in wheat, rye, and barley, and in foods like pasta, pizza, and cereal. Celiac disease is also known as celiac sprue, nontropical sprue, and gluten-sensitive enteropathy. What are the causes? This condition is caused by a gene that is passed from parent to child (inherited). What increases the risk? You are more likely to develop this condition if you: Have a family member with the disease. Have an autoimmune condition, such as Type 1 diabetes or a thyroid  disorder. Are male. What are the signs or symptoms? Symptoms of this condition include: Recurring bloating, pain in the abdomen, and gas. Long-term (chronic) diarrhea and pale,  bad-smelling, greasy, or oily stool. Weight loss. Missed menstrual periods. Weak bones (osteoporosis). Fatigue and weakness.  Tingling or other signs of nerve damage. Depression. Poor appetite. Rash. Anemia. In some cases, there are no symptoms of this condition. How is this diagnosed? This condition is diagnosed with a physical exam and tests. Tests may include: Blood tests to check for nutritional deficiencies. Blood tests to look for evidence that the body is attacking cells in the small intestine. A test in which a sample of tissue is taken from the small intestine and looked at under a microscope (biopsy). X-rays of the intestine. Stool tests. Tests to check for nutrient absorption from the intestine. How is this treated? There is no cure for this condition. It is managed by following a strict gluten-free diet. Treatment may also involve avoiding dairy foods, such as milk and cheese, because they are difficult to digest. Most people who follow a strict gluten-free diet feel better and stop having symptoms. The intestine usually heals within 3 months to 2 years. In a small percentage of people, this condition does not improve on a gluten-free diet. If your condition does not improve, more tests may be done. You may also need to work with a celiac disease specialist to find the best treatment for you. Follow these instructions at home:  Follow instructions from your health care provider about what you may eat and drink. Monitor your body's response to the gluten-free diet. Write down any changes in your symptoms and in how you feel. If you eat while you are away from home, prepare your food ahead of time. Or, make sure that the place where you are going has gluten-free options. Suggest to family members that they get screened for early signs of celiac disease. Keep all follow-up visits. This is important. Contact a health care provider if: You continue to have symptoms, even when  you are eating a gluten-free diet. You have trouble following a gluten-free diet. You develop an itchy rash with groups of tiny blisters. You develop severe weakness. You develop balance problems. You develop new symptoms. Summary Celiac disease is a condition in which the body's disease-fighting system (immune system) attacks the small intestine and causes damage. There is no cure for this condition. It is managed by following a strict gluten-free diet. For some people, following a low-dairy or dairy-free diet also helps. Follow instructions from your health care provider about what you may eat and drink. Write down any changes in your symptoms and in how you feel. Contact a health care provider if you continue to have symptoms, even when you are eating a gluten-free diet, or if you have new symptoms. Keep all follow-up visits. This is important. This information is not intended to replace advice given to you by your health care provider. Make sure you discuss any questions you have with your health care provider. Document Revised: 05/02/2021 Document Reviewed: 05/02/2021 Elsevier Patient Education  2024 Elsevier Inc. Gluten-Free Diet for Celiac Disease, Adult  The gluten-free diet includes all foods that do not contain gluten. Gluten is a protein that is found in wheat, rye, barley, and some other grains. Following the gluten-free diet is the only treatment for people with celiac disease. It helps to prevent damage to the intestines and improves or eliminates the symptoms of celiac disease. Following the gluten-free diet requires some planning. It can be challenging at first, but it gets easier with time and practice. There are more gluten-free options available today than ever before. If you need help finding gluten-free foods or if you have questions, talk with your dietitian  or health care provider. What are tips for following this plan? Reading food labels Read all food labels. Gluten is  often added to foods. Always check the ingredient list and look for warnings that the food may contain gluten. Foods that list any of these key words on the label usually contain gluten: Wheat, flour, enriched flour, bromated flour, white flour, durum flour, graham flour, phosphated flour, self-rising flour, semolina, farina, barley (malt), rye, and oats. Starch, dextrin, modified food starch, or cereal. Thickening, fillers, or emulsifiers. Malt flavoring, malt extract, or malt syrup. Hydrolyzed vegetable protein. In the [place] packaged foods that are gluten-free are required to be labeled "GF." These foods should be easy to identify and are safe to eat. In the [place] food companies are also required to list common food allergens, including wheat, on their labels. Shopping When grocery shopping, start in the produce, meat, and dairy sections. These areas are more likely to contain gluten-free foods. Then move to the aisles that contain packaged foods if you need to. Meal planning All fruits, vegetables, and meats are safe to eat and do not contain gluten. Talk with your dietitian or health care provider before taking a gluten-free multivitamin or mineral supplement. Be aware of gluten-free foods having contact with foods that contain gluten (cross-contamination). This can happen at home and with any processed foods. Talk with your health care provider or dietitian about how to reduce the risk of cross-contamination in your home. If you have questions about how a food is processed, ask the manufacturer. What foods can I eat? Fruits All plain fresh, frozen, canned, and dried fruits, and 100% fruit juices. Vegetables All plain fresh, frozen, and canned vegetables, and 100% vegetable juices. Grains Amaranth, bean flours, 100% buckwheat flour, corn, millet, nut flours or nut meals, GF oats, quinoa, rice, sorghum, teff, rice wafers, pure cornmeal tortillas, popcorn, and hot cereals made from cornmeal or  GF grains. Hominy, rice, and wild rice. Some Asian rice noodles or bean noodles. Arrowroot starch, corn bran, corn flour, corn germ, cornmeal, corn starch, potato flour, potato starch flour, and rice bran. Plain, brown, and sweet rice flours. Rice polish, soy flour, and tapioca starch. Meats and other protein foods All fresh beef, pork, poultry, fish, seafood, and eggs. Fish canned in water , oil, brine, or vegetable broth. Plain nuts and seeds, peanut butter.  Some precooked or cured meat, such as sausages or meat loaves. Some frankfurters. Dried beans, dried peas, and lentils. Dairy Fresh plain, dry, evaporated, or condensed milk. Cream, butter, sour cream, whipping cream, and most yogurts. Unprocessed cheese, most processed cheeses, some cottage cheeses, and some cream cheeses. Beverages Coffee, tea, and most herbal teas. Carbonated beverages and some root beers. Wine, sake, and pure distilled spirits, such as gin, vodka, and whiskey. Most hard ciders. Fats and oils Butter, margarine, vegetable oil, hydrogenated butter, olive oil, shortening, lard, cream, and some mayonnaise. Some commercial salad dressings. Olives. Sweets and desserts Sugar, honey, some syrups, molasses, jelly, and jam. Plain hard candy, marshmallows, and gumdrops.  Pure cocoa powder. Plain chocolate. Custard and some pudding mixes. Gelatin desserts, sorbets, frozen ice pops, and sherbet.  Cake, cookies, and other desserts prepared with allowed flours. Some commercial ice creams. Cornstarch, tapioca, and rice puddings. Seasoning and other foods Some canned or frozen soups. Monosodium glutamate (MSG). Cider, rice, and wine vinegar. Baking soda and baking powder.  Cream of tartar. Baking and nutritional yeast. Certain soy sauces made without wheat. Ask your dietitian about specific brands that are  allowed. Nuts, coconut, and chocolate. Salt, pepper, herbs, spices, flavoring extracts, imitation or artificial flavorings, natural  flavorings, and food colorings.  Some medicines and supplements. Rice syrups. The items listed above may not be a complete list of foods and beverages you can eat and drink. Contact a dietitian for more information. What foods should I avoid? Fruits Thickened or prepared fruits and some pie fillings. Some fruit snacks and fruit roll-ups. Vegetables Most creamed vegetables and most vegetables canned in sauces. Some commercially prepared vegetables and salads. Vegetables in a soy sauce marinade or dressing. Grains Barley, bran, bulgur, couscous, cracked wheat, Boyds, farro, graham, malt, matzo, semolina, wheat germ, and all wheat and rye cereals, including spelt and kamut. Cereals containing malt as a flavoring, such as rice cereal. Noodles, spaghetti, macaroni, most packaged rice mixes, and all mixes containing wheat, rye, barley, or triticale. Meats and other protein foods Any meat or meat alternative containing wheat, rye, barley, or gluten stabilizers. These are often marinated or packaged meats, and precooked or cured meat, such as sausages or meat loaves. Bread-containing products, such as Swiss steak, croquettes, meatballs, and meatloaf. Most tuna canned in vegetable broth. [place] with hydrolyzed vegetable protein (HVP) injected as part of the basting. Seitan. Imitation fish. Eggs in sauces made from ingredients to avoid. Dairy Commercial chocolate milk drinks and malted milk. Some non-dairy creamers. Any cheese product containing ingredients to avoid. Beverages Certain cereal beverages. Beer, ale, malted milk, and some root beers. Some hard ciders. Some instant flavored coffees. Some herbal teas made with barley or with barley malt added. Fats and oils Some commercial salad dressings. Sour cream containing modified food starch. Sweets and desserts Some toffees. Chocolate-coated nuts (may be rolled in wheat flour) and some commercial candies and candy bars.  Most cakes, cookies, donuts,  pastries, and other baked goods. Some commercial ice cream. Ice cream cones.  Commercially prepared mixes for cakes, cookies, and other desserts. Bread pudding and other puddings thickened with flour.  Products containing brown rice syrup made with barley malt enzyme. Desserts and sweets made with malt flavoring. Seasoning and other foods Some curry powders, some dry seasoning mixes, some gravy extracts, some meat sauces, some ketchups, some prepared mustards, and horseradish.  Certain soy sauces. Malt vinegar. Bouillon and bouillon cubes that contain HVP. Some chip dips. Some chewing gum. Yeast extract. Brewer's yeast. Caramel color.  Some medicines and supplements. The items listed above may not be a complete list of foods and beverages you should avoid. Contact a dietitian for more information. Summary Gluten is a protein that is found in wheat, rye, barley, and some other grains. The gluten-free diet includes all foods that do not contain gluten. If you need help finding gluten-free foods or if you have questions, talk with your dietitian or your health care provider. Read all food labels. Gluten is often added to foods. Always check the ingredient list and look for warnings that the food may contain gluten. This information is not intended to replace advice given to you by your health care provider. Make sure you discuss any questions you have with your health care provider. Document Revised: 05/02/2021 Document Reviewed: 05/02/2021 Elsevier Patient Education  2024 [employer].

## 2023-11-11 NOTE — Assessment & Plan Note (Addendum)
 Chronic, at baseline. No longer following with Dr Walden Guise. Continue topamax  100mg  BID.

## 2023-11-11 NOTE — Progress Notes (Signed)
 Assessment & Plan:  Cervical radiculopathy Assessment & Plan: Chronic, at baseline. Continue lyrica  100mg  TID ; he would like to resume prescription with me. I have refilled.  Completed CSC.  Follow up in 3 months.    Orders: -     Pregabalin ; Take 1 capsule (100 mg total) by mouth 3 (three) times daily.  Dispense: 90 capsule; Refill: 2  Headache disorder Assessment & Plan: Chronic, at baseline. No longer following with Dr Walden Guise. Continue topamax  100mg  BID.    Bronchitis Assessment & Plan: Duration 3 days. No acute respiratory distress. Discussed more likely viral in etiology. Advised conservative management with mucinex ; provided promethazine  DM refill as previously helpful for cough suppression.Advised not to use  mucinex DM with promethazine  DM.  Orders: -     Promethazine -DM; Take 5 mLs by mouth 3 (three) times daily as needed for cough.  Dispense: 40 mL; Refill: 0  Obesity (BMI 30.0-34.9) -     metFORMIN  HCl ER; Take 1 tablet (500 mg total) by mouth every evening.  Dispense: 90 tablet; Refill: 2  OSA (obstructive sleep apnea) Assessment & Plan: Zepbound  expensive for patient. Discussed weight loss with metformin .    B12 deficiency  Celiac disease Assessment & Plan: Discussed positive antibodies, Antigliadin Abs, IgA  today. Education provided on gluten free diet.       Return precautions given.   Risks, benefits, and alternatives of the medications and treatment plan prescribed today were discussed, and patient expressed understanding.   Education regarding symptom management and diagnosis given to patient on AVS either electronically or printed.  No follow-ups on file.  Bascom Bossier, FNP  Subjective:    Patient ID: Wesley Bridges, male    DOB: 1971/02/21, 53 y.o.   MRN: [id number]  CC: Wesley Bridges is a 53 y.o. male who presents today for follow up.   HPI: Complains of nasal congestion, cough x 3 days Endorses throat is 'wraspy',  sinus pressure  Coughing yellow mucous and cough worse at laying down.   He took one dose of mucinex dm the past 2 nights.   No fever, chills, wheezing sob.   Children and wife have had similar URI symptoms.   Treated with augmentin  for URI 08/3023    He would like for me to resume Lyrica  refill  Zepbound  approved  Seen by [facility] orthopedic 10/24/23 for left hip and left knee pain.   He is compliant with lyrica  100mg  TID which is helpful for neck pain. Occasional use of mobic  7.5mg .   Previously seen by Pain Management  neck and bilateral arm pain 08/08/23 ; he doesn't plan to follow up. Neck pain is at baseline. He is on disability for this.   No h/o substance abuse.  He is no longer on effexor  due to nausea, dizziness, since resolve.    MRI cervical spine 05/12/23 Compared to 09/05/2022, unchanged degenerative disc disease and facet arthropathy with moderate to severe spinal canal and neural foraminal narrowing greatest at C5-C6.    Never smoker  03/2020 Neurology Dr Walden Guise whom originally started topamax  125 mg at bedtime for headache  HA are less severe, less frequent.  HA similar to HAs in the past.    Allergies: Bee pollen, Short ragweed pollen ext, and Pollen extract Current Outpatient Medications on File Prior to Visit  Medication Sig Dispense Refill   albuterol  (VENTOLIN  HFA) 108 (90 Base) MCG/ACT inhaler Inhale 1-2 puffs into the lungs every 6 (six) hours as needed. 8 g  0   amLODipine  (NORVASC ) 5 MG tablet Take 1 tablet (5 mg total) by mouth daily. 90 tablet 3   aspirin  EC 81 MG tablet Take 81 mg by mouth daily.     atorvastatin  (LIPITOR) 80 MG tablet TAKE 1 TABLET DAILY 90 tablet 3   azelastine  (ASTELIN ) 0.1 % nasal spray USE 2 SPRAYS IN EACH NOSTRIL TWICE DAILY AS DIRECTED 120 mL 3   fluticasone  (FLONASE ) 50 MCG/ACT nasal spray USE 2 SPRAYS IN EACH NOSTRIL DAILY 48 g 3   loratadine  (CLARITIN ) 10 MG tablet Take 1 tablet (10 mg total) by mouth daily. (Patient taking  differently: Take 20 mg by mouth daily.) 30 tablet 11   meloxicam  (MOBIC ) 15 MG tablet TAKE 1 TABLET DAILY AS NEEDED FOR PAIN 90 tablet 1   Multiple Vitamin (MULTIVITAMIN WITH MINERALS) TABS tablet Take 1 tablet by mouth at bedtime.     nebivolol  (BYSTOLIC ) 2.5 MG tablet Take 1 tablet (2.5 mg total) by mouth daily. 90 tablet 3   nitroGLYCERIN  (NITROSTAT ) 0.4 MG SL tablet Place 1 tablet (0.4 mg total) under the tongue every 5 (five) minutes as needed for chest pain. 25 tablet 0   pantoprazole  (PROTONIX ) 40 MG tablet Take 1 tablet (40 mg total) by mouth 2 (two) times daily. 180 tablet 1   topiramate  (TOPAMAX ) 100 MG tablet Take 1 tablet (100 mg total) by mouth 2 (two) times daily. 180 tablet 3   No current facility-administered medications on file prior to visit.    Review of Systems  Constitutional:  Negative for chills and fever.  HENT:  Positive for congestion and sinus pressure. Negative for sore throat.   Respiratory:  Positive for cough. Negative for shortness of breath and wheezing.   Cardiovascular:  Negative for chest pain and palpitations.  Gastrointestinal:  Negative for nausea and vomiting.  Musculoskeletal:  Positive for neck pain.  Neurological:  Positive for numbness (BUE). Negative for headaches (at baseline).      Objective:    BP 128/78   Pulse 78   Temp 98.7 F (37.1 C) (Oral)   Ht 5\' 7"  (1.702 m)   Wt 205 lb (93 kg)   SpO2 98%   BMI 32.11 kg/m  BP Readings from Last 3 Encounters:  11/11/23 128/78  10/03/23 130/78  04/29/23 130/82   Wt Readings from Last 3 Encounters:  11/11/23 205 lb (93 kg)  10/03/23 204 lb 12.8 oz (92.9 kg)  04/29/23 201 lb (91.2 kg)    Physical Exam Vitals reviewed.  Constitutional:      Appearance: He is well-developed.  HENT:     Head: Normocephalic and atraumatic.     Right Ear: Hearing, tympanic membrane, ear canal and external ear normal. No decreased hearing noted. No drainage, swelling or tenderness. No middle ear effusion.  Tympanic membrane is not injected, erythematous or bulging.     Left Ear: Hearing, tympanic membrane, ear canal and external ear normal. No decreased hearing noted. No drainage, swelling or tenderness.  No middle ear effusion. Tympanic membrane is not injected, erythematous or bulging.     Nose: Nose normal.     Right Sinus: No maxillary sinus tenderness or frontal sinus tenderness.     Left Sinus: No maxillary sinus tenderness or frontal sinus tenderness.     Mouth/Throat:     Pharynx: Uvula midline. No oropharyngeal exudate or posterior oropharyngeal erythema.     Tonsils: No tonsillar abscesses.  Eyes:     Conjunctiva/sclera: Conjunctivae normal.  Cardiovascular:  Rate and Rhythm: Regular rhythm.     Heart sounds: Normal heart sounds.  Pulmonary:     Effort: Pulmonary effort is normal. No respiratory distress.     Breath sounds: Normal breath sounds. No wheezing, rhonchi or rales.  Lymphadenopathy:     Head:     Right side of head: No submental, submandibular, tonsillar, preauricular, posterior auricular or occipital adenopathy.     Left side of head: No submental, submandibular, tonsillar, preauricular, posterior auricular or occipital adenopathy.     Cervical: No cervical adenopathy.  Skin:    Wesley: Skin is warm and dry.  Neurological:     Mental Status: He is alert.  Psychiatric:        Speech: Speech normal.        Behavior: Behavior normal.

## 2023-11-11 NOTE — Assessment & Plan Note (Addendum)
 Discussed positive antibodies, Antigliadin Abs, IgA  today. Education provided on gluten free diet.

## 2023-11-11 NOTE — Assessment & Plan Note (Signed)
 Zepbound  expensive for patient. Discussed weight loss with metformin .

## 2023-11-16 ENCOUNTER — Encounter: Payer: Self-pay | Admitting: Family

## 2023-11-19 ENCOUNTER — Other Ambulatory Visit: Payer: Self-pay | Admitting: Family

## 2023-11-19 DIAGNOSIS — H109 Unspecified conjunctivitis: Secondary | ICD-10-CM

## 2023-11-19 MED ORDER — ERYTHROMYCIN 5 MG/GM OP OINT: TOPICAL_OINTMENT | OPHTHALMIC | 0 refills | Status: DC

## 2023-11-20 ENCOUNTER — Other Ambulatory Visit: Payer: Self-pay | Admitting: Family

## 2023-11-20 ENCOUNTER — Other Ambulatory Visit: Payer: Self-pay

## 2023-11-20 DIAGNOSIS — J40 Bronchitis, not specified as acute or chronic: Secondary | ICD-10-CM

## 2023-11-20 DIAGNOSIS — K219 Gastro-esophageal reflux disease without esophagitis: Secondary | ICD-10-CM

## 2023-11-20 MED ORDER — AMOXICILLIN-POT CLAVULANATE 875-125 MG PO TABS: 1.0000 | ORAL_TABLET | Freq: Two times a day (BID) | ORAL | 0 refills | Status: AC

## 2023-11-20 NOTE — Telephone Encounter (Signed)
 Previously filled by Dr. Shirly Dow

## 2023-11-21 MED ORDER — PANTOPRAZOLE SODIUM 40 MG PO TBEC: 40.0000 mg | DELAYED_RELEASE_TABLET | Freq: Two times a day (BID) | ORAL | 3 refills | Status: DC

## 2023-12-02 ENCOUNTER — Telehealth: Payer: Self-pay | Admitting: Family

## 2023-12-02 NOTE — Telephone Encounter (Signed)
 Spoke to pt pt verbalized understanding stated he would call if he had any problem getting it from the pharmacy

## 2023-12-02 NOTE — Telephone Encounter (Signed)
 Call pt  Rec'ed request for lyrica  to be sent to mail order  I do not prescribe controlled substances over mail over  He has 2 more refills of lyrica  at [facility] in [place]

## 2023-12-04 ENCOUNTER — Telehealth: Payer: Self-pay | Admitting: Family

## 2023-12-04 NOTE — Telephone Encounter (Signed)
 Good afternoon,  Wesley Bossier, FNP will not be in the office on the day of your scheduled visit, 12/23/23. Please give the office a call back at [phone] and we will reschedule your appointment.  Thank you  LMOM and sent above MyChart message to have pt call back and reschedule. E2C2, please reschedule this visit when the pt calls back. [facility]

## 2023-12-23 ENCOUNTER — Ambulatory Visit: Admitting: Family

## 2024-01-14 ENCOUNTER — Encounter: Payer: Self-pay | Admitting: Family

## 2024-01-14 ENCOUNTER — Ambulatory Visit (INDEPENDENT_AMBULATORY_CARE_PROVIDER_SITE_OTHER): Admitting: Family

## 2024-01-14 VITALS — BP 126/70 | HR 67 | Temp 98.0°F | Ht 67.0 in | Wt 205.2 lb

## 2024-01-14 DIAGNOSIS — F321 Major depressive disorder, single episode, moderate: Secondary | ICD-10-CM | POA: Diagnosis not present

## 2024-01-14 DIAGNOSIS — B351 Tinea unguium: Secondary | ICD-10-CM | POA: Diagnosis not present

## 2024-01-14 DIAGNOSIS — G6289 Other specified polyneuropathies: Secondary | ICD-10-CM | POA: Diagnosis not present

## 2024-01-14 DIAGNOSIS — J309 Allergic rhinitis, unspecified: Secondary | ICD-10-CM | POA: Diagnosis not present

## 2024-01-14 DIAGNOSIS — M5412 Radiculopathy, cervical region: Secondary | ICD-10-CM

## 2024-01-14 MED ORDER — DULOXETINE HCL 30 MG PO CPEP: 30.0000 mg | ORAL_CAPSULE | Freq: Every day | ORAL | 3 refills | Status: DC

## 2024-01-14 MED ORDER — TERBINAFINE HCL 250 MG PO TABS: 250.0000 mg | ORAL_TABLET | Freq: Every day | ORAL | 0 refills | Status: DC

## 2024-01-14 MED ORDER — LEVOCETIRIZINE DIHYDROCHLORIDE 5 MG PO TABS: 5.0000 mg | ORAL_TABLET | Freq: Every evening | ORAL | 3 refills | Status: DC

## 2024-01-14 NOTE — Patient Instructions (Addendum)
 Stop  claritin  Trial of Xyzal   Referral to neurology for second opinion in regards to peripheral neuropathy; continue lyrica  for now  Start lamisil  for fungal infection; you will need liver function labs in 6 weeks  Start cymbalta  for depression and anxiety  Our hope is for gradual improvement of mood since starting medication; however this may take several weeks.   If you start to have unusual thoughts, thoughts of hurting yourself, or anyone else, please go immediately to the emergency department.   Follow up in 6 weeks.   Please text to [phone] and write the word 'home'. This will put you in touch with trained crisis counselor and resources.    National Suicide Prevention Hotline - available 24 hours a day, 7 days a week.  [phone]  Major Depressive Disorder Major depressive disorder is a mental illness. It also may be called clinical depression or unipolar depression. Major depressive disorder usually causes feelings of sadness, hopelessness, or helplessness. Some people with this disorder do not feel particularly sad but lose interest in doing things they used to enjoy (anhedonia). Major depressive disorder also can cause physical symptoms. It can interfere with work, school, relationships, and other normal everyday activities. The disorder varies in severity but is longer lasting and more serious than the sadness we all feel from time to time in our lives. Major depressive disorder often is triggered by stressful life events or major life changes. Examples of these triggers include divorce, loss of your job or home, a move, and the death of a family member or close friend. Sometimes this disorder occurs for no obvious reason at all. People who have family members with major depressive disorder or bipolar disorder are at higher risk for developing this disorder, with or without life stressors. Major depressive disorder can occur at any age. It may occur just once in your life  (single episode major depressive disorder). It may occur multiple times (recurrent major depressive disorder). SYMPTOMS People with major depressive disorder have either anhedonia or depressed mood on nearly a daily basis for at least 2 weeks or longer. Symptoms of depressed mood include: Feelings of sadness (blue or down in the dumps) or emptiness. Feelings of hopelessness or helplessness. Tearfulness or episodes of crying (may be observed by others). Irritability (children and adolescents). In addition to depressed mood or anhedonia or both, people with this disorder have at least four of the following symptoms: Difficulty sleeping or sleeping too much.   Significant change (increase or decrease) in appetite or weight.   Lack of energy or motivation. Feelings of guilt and worthlessness.   Difficulty concentrating, remembering, or making decisions. Unusually slow movement (psychomotor retardation) or restlessness (as observed by others).   Recurrent wishes for death, recurrent thoughts of self-harm (suicide), or a suicide attempt. People with major depressive disorder commonly have persistent negative thoughts about themselves, other people, and the world. People with severe major depressive disorder may experience distorted beliefs or perceptions about the world (psychotic delusions). They also may see or hear things that are not real (psychotic hallucinations). DIAGNOSIS Major depressive disorder is diagnosed through an assessment by your health care provider. Your health care provider will ask about aspects of your daily life, such as mood, sleep, and appetite, to see if you have the diagnostic symptoms of major depressive disorder. Your health care provider may ask about your medical history and use of alcohol or drugs, including prescription medicines. Your health care provider also may do a physical exam  and blood work. This is because certain medical conditions and the use of certain  substances can cause major depressive disorder-like symptoms (secondary depression). Your health care provider also may refer you to a mental health specialist for further evaluation and treatment. TREATMENT It is important to recognize the symptoms of major depressive disorder and seek treatment. The following treatments can be prescribed for this disorder:   Medicine. Antidepressant medicines usually are prescribed. Antidepressant medicines are thought to correct chemical imbalances in the brain that are commonly associated with major depressive disorder. Other types of medicine may be added if the symptoms do not respond to antidepressant medicines alone or if psychotic delusions or hallucinations occur. Talk therapy. Talk therapy can be helpful in treating major depressive disorder by providing support, education, and guidance. Certain types of talk therapy also can help with negative thinking (cognitive behavioral therapy) and with relationship issues that trigger this disorder (interpersonal therapy). A mental health specialist can help determine which treatment is best for you. Most people with major depressive disorder do well with a combination of medicine and talk therapy. Treatments involving electrical stimulation of the brain can be used in situations with extremely severe symptoms or when medicine and talk therapy do not work over time. These treatments include electroconvulsive therapy, transcranial magnetic stimulation, and vagal nerve stimulation.   This information is not intended to replace advice given to you by your health care provider. Make sure you discuss any questions you have with your health care provider.   Document Released: 10/06/2012 Document Revised: 07/02/2014 Document Reviewed: 10/06/2012 Elsevier Interactive Patient Education [employer]

## 2024-01-14 NOTE — Assessment & Plan Note (Addendum)
 Chronic, uncontrolled.  Discussed passive suicidal ideation.  He denies suicidal plan or prior attempts.  He adamantly denies thoughts today of harming himself.He politely declines counseling. Tried cymbalta  ( blurry vision), effexor  ( visual hallucinations).  He is interested in retrial of cymbalta . Denies vision loss on cymbalta .  Close follow-up

## 2024-01-14 NOTE — Progress Notes (Signed)
 Assessment & Plan:  Current moderate episode of major depressive disorder without prior episode [facility]) Assessment & Plan: Chronic, uncontrolled.  Discussed passive suicidal ideation.  He denies suicidal plan or prior attempts.  He adamantly denies thoughts today of harming himself.He politely declines counseling. Tried cymbalta  ( blurry vision), effexor  ( visual hallucinations).  He is interested in retrial of cymbalta . Denies vision loss on cymbalta .  Close follow-up   Orders: -     DULoxetine  HCl; Take 1 capsule (30 mg total) by mouth daily.  Dispense: 30 capsule; Refill: 3  Onychomycosis Assessment & Plan: Presentation consistent with onychomycosis.  Discussed Lamisil  and potential for elevated liver enzymes, liver toxicity.  He would like to retrial medication.  Hepatic enzyme ordered  Orders: -     Terbinafine  HCl; Take 1 tablet (250 mg total) by mouth daily. 12 weeks of treatment  Dispense: 90 tablet; Refill: 0 -     Hepatic function panel; Future  Other polyneuropathy -     Ambulatory referral to Neurology -     B12 and Folate Panel; Future  Allergic rhinitis, unspecified seasonality, unspecified trigger Assessment & Plan: Chronic, suboptimal control.  Stop Claritin .  Trial of Xyzal .  Continue Flonase  and azelastine .  Discussed if symptoms persist despite optimizing antihistamine therapy, will place referral to allergist  Orders: -     Levocetirizine Dihydrochloride ; Take 1 tablet (5 mg total) by mouth every evening.  Dispense: 90 tablet; Refill: 3  Cervical radiculopathy Assessment & Plan: Chronic, suboptimal control . reviewed recent neurosurgery consult note.  Patient would like second opinion in regards to neuropathy.  Referral placed to neurology in [place].  Continue Lyrica  100 mg 3 times daily for now.  Orders: -     B12 and Folate Panel; Future     Return precautions given.   Risks, benefits, and alternatives of the medications and treatment plan  prescribed today were discussed, and patient expressed understanding.   Education regarding symptom management and diagnosis given to patient on AVS either electronically or printed.  Return in about 6 weeks (around 02/25/2024).  Rollene Northern, FNP  Subjective:    Patient ID: Wesley Bridges, male    DOB: [birth year], 53 y.o.   MRN: [id number]  CC: Wesley Bridges is a 53 y.o. male who presents today for follow up.   HPI: Complains of yellow discoloration of toenails.  Similar presentation in 2021 which resolved with Lamisil    Describes episodic fleeing thoght of not being here.  He adamantly declines suicidal plan or homicidal ideation. He close to his wife and children.  He confides in his children.  Denies prior attempt of suicide  Guns store unloaded in the home.    Tried cymbalta  ( blurry vision), effexor  ( visual hallucinations)  Compliant with lyrica  100mg  TID without adequate control of peripheral neuropathy in his arms.  Compliant with topamax  100mg  BID  Recent dx celiac disease  He also complains of clear runny discharge from his eyes despite use of flonase , azelastine , claritin .   MRI cervical spine 05/12/23 Compared to 09/05/2022, unchanged degenerative disc disease and facet arthropathy with moderate to severe spinal canal and neural foraminal narrowing greatest at C5-C6.    He also follows with [facility] neurosurgery, Dr Rocco; last seen 09/2023 Allergies: Bee pollen, Short ragweed pollen ext, and Pollen extract Current Outpatient Medications on File Prior to Visit  Medication Sig Dispense Refill   albuterol  (VENTOLIN  HFA) 108 (90 Base) MCG/ACT inhaler Inhale 1-2 puffs into the lungs every  6 (six) hours as needed. 8 g 0   amLODipine  (NORVASC ) 5 MG tablet Take 1 tablet (5 mg total) by mouth daily. 90 tablet 3   aspirin  EC 81 MG tablet Take 81 mg by mouth daily.     atorvastatin  (LIPITOR) 80 MG tablet TAKE 1 TABLET DAILY 90 tablet 3   azelastine   (ASTELIN ) 0.1 % nasal spray USE 2 SPRAYS IN EACH NOSTRIL TWICE DAILY AS DIRECTED 120 mL 3   fluticasone  (FLONASE ) 50 MCG/ACT nasal spray USE 2 SPRAYS IN EACH NOSTRIL DAILY 48 g 3   meloxicam  (MOBIC ) 15 MG tablet TAKE 1 TABLET DAILY AS NEEDED FOR PAIN 90 tablet 1   Multiple Vitamin (MULTIVITAMIN WITH MINERALS) TABS tablet Take 1 tablet by mouth at bedtime.     nebivolol  (BYSTOLIC ) 2.5 MG tablet Take 1 tablet (2.5 mg total) by mouth daily. 90 tablet 3   nitroGLYCERIN  (NITROSTAT ) 0.4 MG SL tablet Place 1 tablet (0.4 mg total) under the tongue every 5 (five) minutes as needed for chest pain. 25 tablet 0   pantoprazole  (PROTONIX ) 40 MG tablet Take 1 tablet (40 mg total) by mouth 2 (two) times daily. 180 tablet 3   pregabalin  (LYRICA ) 100 MG capsule Take 1 capsule (100 mg total) by mouth 3 (three) times daily. 90 capsule 2   topiramate  (TOPAMAX ) 100 MG tablet Take 1 tablet (100 mg total) by mouth 2 (two) times daily. 180 tablet 3   metFORMIN  (GLUCOPHAGE -XR) 500 MG 24 hr tablet Take 1 tablet (500 mg total) by mouth every evening. (Patient not taking: Reported on 01/14/2024) 90 tablet 2   No current facility-administered medications on file prior to visit.    Review of Systems  Constitutional:  Negative for chills and fever.  HENT:  Negative for congestion and sinus pain.   Eyes:  Positive for discharge.  Respiratory:  Negative for cough and shortness of breath.   Cardiovascular:  Negative for chest pain and palpitations.  Gastrointestinal:  Negative for nausea and vomiting.  Musculoskeletal:  Positive for neck pain.  Neurological:  Positive for numbness.  Psychiatric/Behavioral:  Negative for suicidal ideas.       Objective:    BP 126/70   Pulse 67   Temp 98 F (36.7 C) (Oral)   Ht 5' 7 (1.702 m)   Wt 205 lb 3.2 oz (93.1 kg)   SpO2 97%   BMI 32.14 kg/m  BP Readings from Last 3 Encounters:  01/14/24 126/70  11/11/23 128/78  10/03/23 130/78   Wt Readings from Last 3 Encounters:   01/14/24 205 lb 3.2 oz (93.1 kg)  11/11/23 205 lb (93 kg)  10/03/23 204 lb 12.8 oz (92.9 kg)      01/14/2024   11:00 AM 11/11/2023    9:18 AM 10/03/2023    9:19 AM  Depression screen PHQ 2/9  Decreased Interest 1 0 0  Down, Depressed, Hopeless 1 0 0  PHQ - 2 Score 2 0 0  Altered sleeping 0 0 0  Tired, decreased energy 1 0 0  Change in appetite 0 0 0  Feeling bad or failure about yourself  1 0 0  Trouble concentrating 0 0 0  Moving slowly or fidgety/restless 0 0 0  Suicidal thoughts 1 0 0  PHQ-9 Score 5 0 0  Difficult doing work/chores Somewhat difficult Not difficult at all Not difficult at all      01/14/2024   11:20 AM 11/11/2023    9:18 AM 10/03/2023    9:19 AM  04/29/2023    9:33 AM  GAD 7 : Generalized Anxiety Score  Nervous, Anxious, on Edge 1 0 0 0  Control/stop worrying 1 0 0 1  Worry too much - different things 1 0 0 1  Trouble relaxing 1 0 0 0  Restless 0 0 0 0  Easily annoyed or irritable 0 0 0 1  Afraid - awful might happen 0 0 0 1  Total GAD 7 Score 4 0 0 4  Anxiety Difficulty Somewhat difficult Not difficult at all Not difficult at all Somewhat difficult      Physical Exam Vitals reviewed.  Constitutional:      Appearance: He is well-developed.  Cardiovascular:     Rate and Rhythm: Regular rhythm.     Heart sounds: Normal heart sounds.  Pulmonary:     Effort: Pulmonary effort is normal. No respiratory distress.     Breath sounds: Normal breath sounds. No wheezing, rhonchi or rales.  Feet:     Comments: Bilateral toenails, multiple toenails yellow, thickened.  No black streaks Skin:    General: Skin is warm and dry.  Neurological:     Mental Status: He is alert.  Psychiatric:        Speech: Speech normal.        Behavior: Behavior normal.

## 2024-01-16 ENCOUNTER — Ambulatory Visit: Admitting: Family

## 2024-01-19 ENCOUNTER — Telehealth: Payer: Self-pay | Admitting: Family

## 2024-01-19 NOTE — Telephone Encounter (Signed)
 Call patient I noticed that his labs are not scheduled.  I started Cymbalta  and Lamisil .  It is very important that his liver enzymes are checked 4 weeks after starting these medications due to risk of liver toxicity.  Please schedule

## 2024-01-19 NOTE — Assessment & Plan Note (Signed)
 Chronic, suboptimal control.  Stop Claritin .  Trial of Xyzal .  Continue Flonase  and azelastine .  Discussed if symptoms persist despite optimizing antihistamine therapy, will place referral to allergist

## 2024-01-19 NOTE — Assessment & Plan Note (Addendum)
 Chronic, suboptimal control . reviewed recent neurosurgery consult note.  Patient would like second opinion in regards to neuropathy.  Referral placed to neurology in [place].  Continue Lyrica  100 mg 3 times daily for now.

## 2024-01-19 NOTE — Assessment & Plan Note (Signed)
 Presentation consistent with onychomycosis.  Discussed Lamisil  and potential for elevated liver enzymes, liver toxicity.  He would like to retrial medication.  Hepatic enzyme ordered

## 2024-01-20 NOTE — Telephone Encounter (Signed)
 LVM  and sent pt my chart message as well to inform pt that he needs to call and schedule lab appt due to beginning new medication    I noticed that his labs are not scheduled.  I started Cymbalta  and Lamisil .  It is very important that his liver enzymes are checked 4 weeks after starting these medications due to risk of liver toxicity.  Please schedule

## 2024-01-21 NOTE — Telephone Encounter (Signed)
Appt has been scheduled with provider

## 2024-02-03 ENCOUNTER — Other Ambulatory Visit: Payer: Self-pay

## 2024-02-03 DIAGNOSIS — K219 Gastro-esophageal reflux disease without esophagitis: Secondary | ICD-10-CM

## 2024-02-03 DIAGNOSIS — J309 Allergic rhinitis, unspecified: Secondary | ICD-10-CM

## 2024-02-03 MED ORDER — PANTOPRAZOLE SODIUM 40 MG PO TBEC: 40.0000 mg | DELAYED_RELEASE_TABLET | Freq: Two times a day (BID) | ORAL | 3 refills | Status: AC

## 2024-02-03 MED ORDER — LEVOCETIRIZINE DIHYDROCHLORIDE 5 MG PO TABS: 5.0000 mg | ORAL_TABLET | Freq: Every evening | ORAL | 3 refills | Status: DC

## 2024-02-05 ENCOUNTER — Other Ambulatory Visit: Payer: Self-pay | Admitting: Family

## 2024-02-05 DIAGNOSIS — F321 Major depressive disorder, single episode, moderate: Secondary | ICD-10-CM

## 2024-02-27 ENCOUNTER — Encounter: Payer: Self-pay | Admitting: Family

## 2024-02-27 ENCOUNTER — Ambulatory Visit (INDEPENDENT_AMBULATORY_CARE_PROVIDER_SITE_OTHER): Admitting: Family

## 2024-02-27 VITALS — BP 130/86 | HR 61 | Temp 98.2°F | Ht 67.0 in | Wt 207.1 lb

## 2024-02-27 DIAGNOSIS — H9193 Unspecified hearing loss, bilateral: Secondary | ICD-10-CM

## 2024-02-27 DIAGNOSIS — Z23 Encounter for immunization: Secondary | ICD-10-CM

## 2024-02-27 DIAGNOSIS — B351 Tinea unguium: Secondary | ICD-10-CM

## 2024-02-27 DIAGNOSIS — R519 Headache, unspecified: Secondary | ICD-10-CM | POA: Diagnosis not present

## 2024-02-27 DIAGNOSIS — F321 Major depressive disorder, single episode, moderate: Secondary | ICD-10-CM

## 2024-02-27 DIAGNOSIS — G4733 Obstructive sleep apnea (adult) (pediatric): Secondary | ICD-10-CM

## 2024-02-27 DIAGNOSIS — M5412 Radiculopathy, cervical region: Secondary | ICD-10-CM

## 2024-02-27 DIAGNOSIS — G6289 Other specified polyneuropathies: Secondary | ICD-10-CM | POA: Diagnosis not present

## 2024-02-27 NOTE — Patient Instructions (Addendum)
 Start taking cymbalta  30mg  every other day for one week and then stop the medication entirely.  Start magnesium citrate 400mg  daily for headache prevention.   I am concerned with untreated sleep apnea and placed a referral to pulmonology and also to pharmacy ( to discuss Zepbound  cost).   Let me know if headaches persist.    For hearing testing, I have referred you to [facility]   Let us  know if you dont hear back within 2 weeks in regards to an appointment being scheduled for all referrals   So that you are aware, if you are [facility] MyChart user , please pay attention to your MyChart messages as you may receive a MyChart message with a phone number to call and schedule this test/appointment own your own from our referral coordinator. This is a new process so I do not want you to miss this message.  If you are not a MyChart user, you will receive a phone call.

## 2024-02-27 NOTE — Assessment & Plan Note (Signed)
 PHQ-9 6 today. He denies current depressive symptoms. Wean off Cymbalta  as per headache management plan. Monitor mood and depressive symptoms post-discontinuation.

## 2024-02-27 NOTE — Assessment & Plan Note (Signed)
 Unable to wear cipap. Insurance didn't cover the price of Zepbound  ( $~700/mo). Referral pharmD and to pulmonology.

## 2024-02-27 NOTE — Progress Notes (Signed)
 Assessment & Plan:  Headache disorder Assessment & Plan: Chronic posterior headache.  Reassuring neurologic exam today.  No alarm features at this time.  Question if related to side effect of Cymbalta .  Also discussed my concern as a relates to headache and noncompliance with CPAP.  Wean off Cymbalta  by taking 30 mg every other day for one week, then stop. Start magnesium supplement for headache prevention. Refer to pulmonology for CPAP evaluation/alternatives.  Pending neurology consult.  Continue Topamax  100 mg twice daily.  Close follow-up   Other polyneuropathy -     B12 and Folate Panel  Cervical radiculopathy -     B12 and Folate Panel  Onychomycosis -     Hepatic function panel  OSA (obstructive sleep apnea) Assessment & Plan: Unable to wear cipap. Insurance didn't cover the price of Zepbound  ( $~700/mo). Referral pharmD and to pulmonology.     Orders: -     Pulmonary Visit -     AMB Referral [facility] Care Management  Bilateral hearing loss, unspecified hearing loss type -     Ambulatory referral to ENT  Need for influenza vaccination -     Flu vaccine trivalent PF, 6mos and older(Flulaval,Afluria,Fluarix,Fluzone)  Current moderate episode of major depressive disorder without prior episode (HCC) Assessment & Plan: PHQ-9 6 today. He denies current depressive symptoms. Wean off Cymbalta  as per headache management plan. Monitor mood and depressive symptoms post-discontinuation.      Return precautions given.   Risks, benefits, and alternatives of the medications and treatment plan prescribed today were discussed, and patient expressed understanding.   Education regarding symptom management and diagnosis given to patient on AVS either electronically or printed.  Return in about 6 weeks (around 04/09/2024).  Rollene Northern, FNP  Subjective:    Patient ID: Wesley Bridges, male    DOB: December 16, 1970, 53 y.o.   MRN: [id number]  CC: Wesley Bridges is a 53  y.o. male who presents today for follow up.   HPI: HPI Discussed the use of AI scribe software for clinical note transcription with the patient, who gave verbal consent to proceed.  History of Present Illness   Wesley Bridges is a 53 year old male who presents for a follow-up visit for depression and joint pain.  He is experiencing throbbing headaches located at the occipital region, which began after starting Cymbalta . These headaches differ from his previous ones, occurring daily. HA is not worse HA of like. HA doesn't awaken him from sleep. HA is not associated neck pain, vomiting, fever, rash, or vision changes.  He is currently taking Topamax  100 mg twice a day for headache management.  He does not feel the need for an antidepressant as he does not have thoughts of self-harm or increased tearfulness.  He has a history of obstructive sleep apnea diagnosed in January 2020, for which he was prescribed a CPAP machine. He does not use it regularly due to throat dryness and hoarseness after use.  He previously took metformin  for weight loss but discontinued it after two weeks due to nausea and diarrhea. He is not currently taking metformin . He is interested in weight loss to help manage sleep apnea.   His wife has noted he does not hear her well, suggesting possible hearing issues. He would like his hearing tested.       Follow-up depression.  Retrial of Cymbalta .  Compliant with Cymbalta  30 mg daily.  Seen yesterday by [facility] orthopedics for left hip and knee osteoarthritis.  Pending MRI left hip, MRI left knee.  EMG referral placed for carpal tunnel joint osteoarthritis.  Tried cymbalta  ( blurry vision), effexor  ( visual hallucinations)    Allergies: Bee pollen, Short ragweed pollen ext, and Pollen extract Current Outpatient Medications on File Prior to Visit  Medication Sig Dispense Refill   albuterol  (VENTOLIN  HFA) 108 (90 Base) MCG/ACT inhaler Inhale 1-2 puffs into the lungs  every 6 (six) hours as needed. 8 g 0   amLODipine  (NORVASC ) 5 MG tablet Take 1 tablet (5 mg total) by mouth daily. 90 tablet 3   aspirin  EC 81 MG tablet Take 81 mg by mouth daily.     atorvastatin  (LIPITOR) 80 MG tablet TAKE 1 TABLET DAILY 90 tablet 3   azelastine  (ASTELIN ) 0.1 % nasal spray USE 2 SPRAYS IN EACH NOSTRIL TWICE DAILY AS DIRECTED 120 mL 3   fluticasone  (FLONASE ) 50 MCG/ACT nasal spray USE 2 SPRAYS IN EACH NOSTRIL DAILY 48 g 3   levocetirizine (XYZAL  ALLERGY 24HR) 5 MG tablet Take 1 tablet (5 mg total) by mouth every evening. 90 tablet 3   meloxicam  (MOBIC ) 15 MG tablet TAKE 1 TABLET DAILY AS NEEDED FOR PAIN 90 tablet 1   Multiple Vitamin (MULTIVITAMIN WITH MINERALS) TABS tablet Take 1 tablet by mouth at bedtime.     nebivolol  (BYSTOLIC ) 2.5 MG tablet Take 1 tablet (2.5 mg total) by mouth daily. 90 tablet 3   nitroGLYCERIN  (NITROSTAT ) 0.4 MG SL tablet Place 1 tablet (0.4 mg total) under the tongue every 5 (five) minutes as needed for chest pain. 25 tablet 0   pantoprazole  (PROTONIX ) 40 MG tablet Take 1 tablet (40 mg total) by mouth 2 (two) times daily. 180 tablet 3   pregabalin  (LYRICA ) 100 MG capsule Take 1 capsule (100 mg total) by mouth 3 (three) times daily. 90 capsule 2   terbinafine  (LAMISIL ) 250 MG tablet Take 1 tablet (250 mg total) by mouth daily. 12 weeks of treatment 90 tablet 0   topiramate  (TOPAMAX ) 100 MG tablet Take 1 tablet (100 mg total) by mouth 2 (two) times daily. 180 tablet 3   No current facility-administered medications on file prior to visit.    Review of Systems  Constitutional:  Negative for chills and fever.  HENT:  Negative for congestion, ear pain, rhinorrhea, sinus pressure and sore throat.   Eyes:  Negative for visual disturbance.  Respiratory:  Negative for cough, shortness of breath and wheezing.   Cardiovascular:  Negative for chest pain and palpitations.  Gastrointestinal:  Negative for diarrhea, nausea and vomiting.  Genitourinary:  Negative  for dysuria.  Musculoskeletal:  Negative for myalgias and neck pain.  Skin:  Negative for rash.  Neurological:  Positive for headaches. Negative for dizziness and numbness.  Hematological:  Negative for adenopathy.      Objective:    BP 130/86   Pulse 61   Temp 98.2 F (36.8 C) (Oral)   Ht 5' 7 (1.702 m)   Wt 207 lb 1.6 oz (93.9 kg)   SpO2 98%   BMI 32.44 kg/m  BP Readings from Last 3 Encounters:  02/27/24 130/86  01/14/24 126/70  11/11/23 128/78   Wt Readings from Last 3 Encounters:  02/27/24 207 lb 1.6 oz (93.9 kg)  01/14/24 205 lb 3.2 oz (93.1 kg)  11/11/23 205 lb (93 kg)      02/27/2024   10:30 AM 02/27/2024   10:22 AM 01/14/2024   11:00 AM  Depression screen PHQ 2/9  Decreased Interest 1 0  1  Down, Depressed, Hopeless 1 0 1  PHQ - 2 Score 2 0 2  Altered sleeping 0 0 0  Tired, decreased energy 2 0 1  Change in appetite 0 0 0  Feeling bad or failure about yourself  1 0 1  Trouble concentrating 1 0 0  Moving slowly or fidgety/restless 0 0 0  Suicidal thoughts 0 0 1  PHQ-9 Score 6 0 5  Difficult doing work/chores Somewhat difficult Not difficult at all Somewhat difficult      02/27/2024   10:31 AM 02/27/2024   10:22 AM 01/14/2024   11:20 AM 11/11/2023    9:18 AM  GAD 7 : Generalized Anxiety Score  Nervous, Anxious, on Edge 0 0 1 0  Control/stop worrying 0 0 1 0  Worry too much - different things 1 0 1 0  Trouble relaxing 1 0 1 0  Restless 0 0 0 0  Easily annoyed or irritable 1 0 0 0  Afraid - awful might happen 1 0 0 0  Total GAD 7 Score 4 0 4 0  Anxiety Difficulty Very difficult Not difficult at all Somewhat difficult Not difficult at all     Physical Exam Vitals reviewed.  Constitutional:      Appearance: He is well-developed.  HENT:     Right Ear: Hearing normal.     Left Ear: Hearing normal.     Mouth/Throat:     Pharynx: Uvula midline. No posterior oropharyngeal erythema.  Eyes:     General: Lids are normal. Lids are everted, no foreign bodies  appreciated.     Conjunctiva/sclera: Conjunctivae normal.     Pupils: Pupils are equal, round, and reactive to light.     Comments: Normal fundus bilaterally.  Cardiovascular:     Rate and Rhythm: Regular rhythm.     Heart sounds: Normal heart sounds.  Pulmonary:     Effort: Pulmonary effort is normal. No respiratory distress.     Breath sounds: Normal breath sounds. No wheezing, rhonchi or rales.  Musculoskeletal:     Cervical back: No torticollis. No spinous process tenderness or muscular tenderness.  Lymphadenopathy:     Head:     Right side of head: No submental, submandibular, tonsillar, preauricular, posterior auricular or occipital adenopathy.     Left side of head: No submental, submandibular, tonsillar, preauricular, posterior auricular or occipital adenopathy.     Cervical: No cervical adenopathy.  Skin:    General: Skin is warm and dry.  Neurological:     Mental Status: He is alert.     Cranial Nerves: No cranial nerve deficit.     Sensory: No sensory deficit.     Deep Tendon Reflexes:     Reflex Scores:      Bicep reflexes are 2+ on the right side and 2+ on the left side.      Patellar reflexes are 2+ on the right side and 2+ on the left side.    Comments: Grip equal and strong bilateral upper extremities. Gait strong and steady. Able to perform rapid alternating movement without difficulty.  Psychiatric:        Speech: Speech normal.        Behavior: Behavior normal.

## 2024-02-27 NOTE — Assessment & Plan Note (Signed)
 Chronic posterior headache.  Reassuring neurologic exam today.  No alarm features at this time.  Question if related to side effect of Cymbalta .  Also discussed my concern as a relates to headache and noncompliance with CPAP.  Wean off Cymbalta  by taking 30 mg every other day for one week, then stop. Start magnesium supplement for headache prevention. Refer to pulmonology for CPAP evaluation/alternatives.  Pending neurology consult.  Continue Topamax  100 mg twice daily.  Close follow-up

## 2024-02-28 ENCOUNTER — Telehealth: Payer: Self-pay

## 2024-02-28 NOTE — Progress Notes (Signed)
 Care Guide Pharmacy Note  02/28/2024 Name: Wesley Bridges MRN: [id number] DOB: January 16, 1971  Referred By: Dineen Rollene MATSU, FNP Reason for referral: Complex Care Management and Call Attempt #1 (Unsuccessful initial outreach to schedule with PHARM D- Manuelita)   Wesley Bridges is a 53 y.o. year old male who is a primary care patient of Dineen Rollene MATSU, FNP.  Wesley Bridges was referred to the pharmacist for assistance related to: medication adherence  An unsuccessful telephone outreach was attempted today to contact the patient who was referred to the pharmacy team for assistance with medication adherence. Additional attempts will be made to contact the patient.  Leotis Rase [facility], [facility] Guide  Direct Dial: [phone]  Fax [phone]

## 2024-03-02 NOTE — Progress Notes (Unsigned)
 Care Guide Pharmacy Note  03/02/2024 Name: Wesley Bridges MRN: [id number] DOB: 12/27/1970  Referred By: Dineen Rollene MATSU, FNP Reason for referral: Complex Care Management, Call Attempt #1 (Unsuccessful initial outreach to schedule with PHARM DGLENWOOD Shaver), and Call Attempt #2 (Unsuccessful initial outreach to schedule with Lindsay-PHARM D)   Wesley Bridges is a 53 y.o. year old male who is a primary care patient of Dineen Rollene MATSU, FNP.  Wesley Bridges was referred to the pharmacist for assistance related to: Medication adherence  A second unsuccessful telephone outreach was attempted today to contact the patient who was referred to the pharmacy team for assistance with medication adherence. Additional attempts will be made to contact the patient.  Leotis Rase [facility], [facility] Guide  Direct Dial: [phone]  Fax [phone]

## 2024-03-03 NOTE — Progress Notes (Signed)
 Care Guide Pharmacy Note  03/03/2024 Name: Wesley Bridges MRN: [id number] DOB: [birth year]  Referred By: Dineen Rollene MATSU, FNP Reason for referral: Complex Care Management, Call Attempt #1 (Unsuccessful initial outreach to schedule with PHARM DGLENWOOD Shaver), Call Attempt #2 (Unsuccessful initial outreach to schedule with Lindsay-PHARM D), and Call Attempt #3 (Successful initial outreach scheduled with Shaver- PHARM D)   [name] is a 53 y.o. year old male who is a primary care patient of Dineen Rollene MATSU, FNP.  Carlas A [name] was referred to the pharmacist for assistance related to: Medication adherence.  Successful contact was made with the patient to discuss pharmacy services including being ready for the pharmacist to call at least 5 minutes before the scheduled appointment time and to have medication bottles and any blood pressure readings ready for review. The patient agreed to meet with the pharmacist via telephone visit on (date/time). 03-09-24 @ 9 AM.  Leotis Rase [facility], [facility] Guide  Direct Dial: [phone]  Fax [phone]   SIG

## 2024-03-06 ENCOUNTER — Other Ambulatory Visit: Payer: Self-pay

## 2024-03-06 ENCOUNTER — Telehealth: Payer: Self-pay | Admitting: Family

## 2024-03-06 DIAGNOSIS — E785 Hyperlipidemia, unspecified: Secondary | ICD-10-CM

## 2024-03-06 NOTE — Telephone Encounter (Signed)
 Copied from CRM [id number]. Topic: General - Other >> Mar 06, 2024  9:37 AM Ahlexyia S wrote: Reason for CRM: Pharmacy tech Harlene with [facility] called in stating that the medication atorvastatin  (LIPITOR) 80 MG tablet has no claim on file. Pt has had the insurance since January 2025, medication is covered but the pt hasn't received the medication and they are wanting to know why. Harlene stated they have been trying to reach pt since Aug 28th, left multiple voicemail's and pt hasn't gotten back to them.  Callback# [id number]

## 2024-03-08 ENCOUNTER — Other Ambulatory Visit: Payer: Self-pay | Admitting: Family

## 2024-03-08 DIAGNOSIS — M5412 Radiculopathy, cervical region: Secondary | ICD-10-CM

## 2024-03-08 NOTE — Telephone Encounter (Signed)
 Requesting: Lyrica  Contract: No UDS: N/A Last Visit: 02/27/2024 Next Visit: 04/09/2024 Last Refill: 01/28/2024   Please Advise

## 2024-03-09 ENCOUNTER — Encounter: Payer: Self-pay | Admitting: Family

## 2024-03-09 ENCOUNTER — Other Ambulatory Visit (INDEPENDENT_AMBULATORY_CARE_PROVIDER_SITE_OTHER): Admitting: Pharmacist

## 2024-03-09 DIAGNOSIS — M5412 Radiculopathy, cervical region: Secondary | ICD-10-CM

## 2024-03-09 DIAGNOSIS — G4733 Obstructive sleep apnea (adult) (pediatric): Secondary | ICD-10-CM

## 2024-03-09 MED ORDER — ATORVASTATIN CALCIUM 80 MG PO TABS: 80.0000 mg | ORAL_TABLET | Freq: Every day | ORAL | 3 refills | Status: DC

## 2024-03-09 NOTE — Patient Instructions (Signed)
 Mr. Betzalel Umbarger [name],   It was a pleasure to speak with you today! As we discussed:?   It looks like your Medicare plan has a drug deductible of $590. This means that each year, your medicine cost may be higher until you have spent a total of $590 at the pharmacy. After that $590 is reached, your medication cost would then decrease. However, with your current Medicare plan, it looks like the insurance is wanting to charge you around 35% of the cash price of the drug for brand-name medications (this is called coinsurance). This means that even after spending your $590 deductible, the monthly price of Zepbound  would still be around $464.  Not all Medicare plans are the same. Some plans for example, charge a flat rate for all brand-name medications (a common copay is $47 per month for a brand medicine after reaching your deductible).   Each year, Medicare patients may keep their same plan, or choose to select a different plan.   Department of Health and Health and safety inspector [facility]) provides access to all Medicare patients to a Medicare specialist Engineer, production Information Program [facility]) Coordinator). This is a FREE and non-biased service for all [place]  citizens. I recommend speaking with them with a list of your medications as they can provide some insight into different Medicare plans based on your needs. To schedule an appointment to speak with them, you may contact the office by phone, below:   [facility] Resources [email or web address]  Local Senior [employer] Information Program [facility]) Coordinator Norleen Glenn [facility] [address]     [place]  [phone] (Ask for [facility] help)    If you have any questions, you may respond directly to this message, or leave me a voicemail at [phone] and I will get back to you shortly.   Thank you!   Future Appointments  Date Time Provider Department Center  04/09/2024 10:00  AM Dineen Rollene MATSU, FNP [facility] [address]  04/10/2024  9:00 AM Isaiah Scrivener, MD [facility] [address]    Manuelita FABIENE Kobs, PharmD Clinical Pharmacist [facility] [phone]

## 2024-03-09 NOTE — Addendum Note (Signed)
 Addended by: Lealon Vanputten on: 03/09/2024 02:34 PM   Modules accepted: Orders

## 2024-03-09 NOTE — Telephone Encounter (Signed)
 Spoke to pt he is taking the Lipitor 80 mg daily, refill sent in to pharmacy as well

## 2024-03-09 NOTE — Progress Notes (Signed)
   03/09/2024 Name: Wesley Bridges MRN: [id number] DOB: Feb 16, 1971  Subjective  Chief Complaint  Patient presents with   Medication Access    Care Team: Primary Care Provider: Dineen Rollene MATSU, FNP  Reason for visit: ?  Wesley Bridges is a 53 y.o. male who presents today for a telephone visit with the pharmacist due to medication access concerns.   Medication Access: ?  Reports that all medications are not affordable.  Zepbound  was prescribed for OSA though patient reports a copay of $700.  Insurance medication deductible: $590.  Co-insurance : 35% (~$464/month after deductible)  Prescription drug coverage: YES Payor: MEDICARE / Plan: MEDICARE PART A AND B / Product Type: *No Product type* / .  Summary of Benefit: Prior authorization approved: Zepbound . Approved through 06/24/2024  Assessment and Plan:   1. Medication Access Current plan has a high co-insurance, making all brand medications expensive.  With [employer], patient is not eligible for copay cards, though this would only bring cost down by ~$150.  There are no PAP programs available for any weight loss or OSA GLP1s.  [place] Direct program offers vials for ~$499/month which is not cheaper than insurance price on test claim.  Discussed medicare plan with patient in detail including his deductible and coinsurance.  Reviewed SHIIP coordinator resource for [employer] regarding help with reviewing Medicare plans and benefits during open-enrollment as desired.  Local Senior [employer] Information Program [facility]) Coordinator Norleen Glenn [facility] [address]     [place]  [phone] (Ask for [facility] help)  Future Appointments  Date Time Provider Department Center  04/09/2024 10:00 AM Dineen Rollene MATSU, FNP [facility] [address]  04/10/2024  9:00 AM Isaiah Scrivener, MD [facility] [address]   Manuelita FABIENE Kobs, PharmD Clinical Pharmacist [facility]  [facility] [phone]

## 2024-03-11 ENCOUNTER — Encounter: Payer: Self-pay | Admitting: Internal Medicine

## 2024-03-11 ENCOUNTER — Ambulatory Visit (INDEPENDENT_AMBULATORY_CARE_PROVIDER_SITE_OTHER): Admitting: Internal Medicine

## 2024-03-11 VITALS — BP 130/80 | HR 60 | Temp 97.9°F | Ht 67.0 in | Wt 209.4 lb

## 2024-03-11 DIAGNOSIS — Z6832 Body mass index (BMI) 32.0-32.9, adult: Secondary | ICD-10-CM

## 2024-03-11 DIAGNOSIS — E669 Obesity, unspecified: Secondary | ICD-10-CM

## 2024-03-11 DIAGNOSIS — G4733 Obstructive sleep apnea (adult) (pediatric): Secondary | ICD-10-CM | POA: Diagnosis not present

## 2024-03-11 NOTE — Patient Instructions (Signed)
  RECOMMEND HOME SLEEP TEST FOR FURTHER DIAGNOSIS

## 2024-03-11 NOTE — Progress Notes (Unsigned)
 Name: Wesley Bridges MRN: [id number] DOB: 1970/11/22    CHIEF COMPLAINT:  ASSESSMENT OF SLEEP APNEA EXCESSIVE DAYTIME SLEEPINESS   HISTORY OF PRESENT ILLNESS: Patient is seen today for problems and issues with sleep related to excessive daytime sleepiness Patient  has been having sleep problems for many years Patient has been having excessive daytime sleepiness for a long time Patient has been having extreme fatigue and tiredness, lack of energy +  very Loud snoring every night + struggling breathe at night and gasps for air + morning headaches + Nonrefreshing sleep  Discussed sleep data and reviewed with patient.  Encouraged proper weight management.  Discussed driving precautions and its relationship with hypersomnolence.  Discussed operating dangerous equipment and its relationship with hypersomnolence.  Discussed sleep hygiene, and benefits of a fixed sleep waked time.  The importance of getting eight or more hours of sleep discussed with patient.  Discussed limiting the use of the computer and television before bedtime.  Decrease naps during the day, so night time sleep will become enhanced.  Limit caffeine, and sleep deprivation.  HTN, stroke, and heart failure are potential risk factors.       03/11/2024    1:00 PM  Results of the Epworth flowsheet  Sitting and reading 3  Watching TV 3  Sitting, inactive in a public place (e.g. a theatre or a meeting) 3  As a passenger in a car for an hour without a break 3  Lying down to rest in the afternoon when circumstances permit 3  Sitting and talking to someone 0  Sitting quietly after a lunch without alcohol 2  In a car, while stopped for a few minutes in traffic 1  Total score 18   Patient with a previous diagnosis of sleep apnea AHI of 25 With severe hypoxia Patient started CPAP therapy however had significant issues with her dry mouth dry lips Patient intolerant of CPAP and discontinued his therapy 5 years  ago  No exacerbation at this time No evidence of heart failure at this time No evidence or signs of infection at this time No respiratory distress No fevers, chills, nausea, vomiting, diarrhea No evidence of lower extremity edema No evidence hemoptysis  Plan to reestablish diagnosis of sleep apnea     PAST MEDICAL HISTORY :   has a past medical history of Anginal pain (HCC), Arthritis, Asthma, Broken jaw (HCC), Chronic headaches, Complication of anesthesia, Depression, Dyspnea, Dysrhythmia, Family history of adverse reaction to anesthesia, GERD (gastroesophageal reflux disease), Hyperlipidemia, Hypertension, Osteoporosis, Sleep apnea, and Subclinical hyperthyroidism.  has a past surgical history that includes Ankle fracture surgery; Ankle Fusion; Femur fracture surgery; Mandible fracture surgery; LEFT HEART CATH AND CORONARY ANGIOGRAPHY (N/A, 01/08/2017); Fracture surgery; Robot assisted inguinal hernia repair (Bilateral, 04/17/2018); I & D extremity (Right, 07/30/2018); Knee arthroscopy with medial menisectomy (Left, 05/13/2019); Cardiac catheterization; Hernia repair (Bilateral); Elbow arthroscopy (Right, 09/01/2019); Colonoscopy with propofol  (N/A, 12/21/2021); Esophagogastroduodenoscopy (egd) with propofol  (N/A, 12/21/2021); and right elbow surgery (07/25/2020). Prior to Admission medications   Medication Sig Start Date End Date Taking? Authorizing Provider  albuterol  (VENTOLIN  HFA) 108 (90 Base) MCG/ACT inhaler Inhale 1-2 puffs into the lungs every 6 (six) hours as needed. 09/17/23   Vivienne Delon HERO, PA-C  amLODipine  (NORVASC ) 5 MG tablet Take 1 tablet (5 mg total) by mouth daily. 06/24/23   Maribeth Camellia MATSU, MD  aspirin  EC 81 MG tablet Take 81 mg by mouth daily.    [provider]  atorvastatin  (LIPITOR) 80 MG  tablet Take 1 tablet (80 mg total) by mouth daily. 03/09/24   Dineen Rollene MATSU, FNP  azelastine  (ASTELIN ) 0.1 % nasal spray USE 2 SPRAYS IN EACH NOSTRIL TWICE  DAILY AS DIRECTED 06/20/23   Maribeth Camellia MATSU, MD  fluticasone  (FLONASE ) 50 MCG/ACT nasal spray USE 2 SPRAYS IN EACH NOSTRIL DAILY 07/09/23   Maribeth Camellia MATSU, MD  levocetirizine (XYZAL  ALLERGY 24HR) 5 MG tablet Take 1 tablet (5 mg total) by mouth every evening. 02/03/24   Dineen Rollene MATSU, FNP  meloxicam  (MOBIC ) 15 MG tablet TAKE 1 TABLET DAILY AS NEEDED FOR PAIN 04/10/23   Maribeth Camellia MATSU, MD  Multiple Vitamin (MULTIVITAMIN WITH MINERALS) TABS tablet Take 1 tablet by mouth at bedtime.    [provider]  nebivolol  (BYSTOLIC ) 2.5 MG tablet Take 1 tablet (2.5 mg total) by mouth daily. 06/24/23   Maribeth Camellia MATSU, MD  nitroGLYCERIN  (NITROSTAT ) 0.4 MG SL tablet Place 1 tablet (0.4 mg total) under the tongue every 5 (five) minutes as needed for chest pain. 12/05/22   Webb, Padonda B, FNP  pantoprazole  (PROTONIX ) 40 MG tablet Take 1 tablet (40 mg total) by mouth 2 (two) times daily. 02/03/24   Dineen Rollene MATSU, FNP  pregabalin  (LYRICA ) 100 MG capsule TAKE 1 CAPSULE (100 MG TOTAL) BY MOUTH THREE TIMES DAILY. 03/09/24   Dineen Rollene MATSU, FNP  terbinafine  (LAMISIL ) 250 MG tablet Take 1 tablet (250 mg total) by mouth daily. 12 weeks of treatment 01/14/24   Dineen Rollene MATSU, FNP  topiramate  (TOPAMAX ) 100 MG tablet Take 1 tablet (100 mg total) by mouth 2 (two) times daily. 06/24/23   Maribeth Camellia MATSU, MD   Allergies  Allergen Reactions   Bee Pollen Other (See Comments) and Cough    Cough runny eyes   Short Ragweed Pollen Ext Other (See Comments)   Pollen Extract     FAMILY HISTORY:  family history includes Arthritis in an other family member; Hypertension in his brother, father, and mother; Non-Hodgkin's lymphoma (age of onset: 23) in his father; Osteoporosis in his mother; Syncope episode in his mother; Thyroid  disease in his mother. SOCIAL HISTORY:  reports that he has never smoked. He has never used smokeless tobacco. He reports current alcohol use of about 14.0 standard drinks  of alcohol per week. He reports that he does not use drugs.    BP 130/80   Pulse 60   Temp 97.9 F (36.6 C)   Ht 5' 7 (1.702 m)   Wt 209 lb 6.4 oz (95 kg)   SpO2 100%   BMI 32.80 kg/m    Review of Systems: Gen:  Denies  fever, sweats, chills weight loss  HEENT: Denies blurred vision, double vision, ear pain, eye pain, hearing loss, nose bleeds, sore throat Cardiac:  No dizziness, chest pain or heaviness, chest tightness,edema, No JVD Resp:   No cough, -sputum production, -shortness of breath,-wheezing, -hemoptysis,  Other:  All other systems negative   Physical Examination:   General Appearance: No distress  EYES PERRLA, EOM intact.   NECK Supple, No JVD Pulmonary: normal breath sounds, No wheezing.  CardiovascularNormal S1,S2.  No m/r/g.   Abdomen: Benign, Soft, non-tender. Neurology UE/LE 5/5 strength, no focal deficits Ext pulses intact, cap refill intact ALL OTHER ROS ARE NEGATIVE     ASSESSMENT AND PLAN SYNOPSIS  53 year old pleasant Hispanic male seen today for signs and symptoms of excessive daytime sleepiness with underlying diagnosis of obstructive sleep apnea in the setting of obesity and deconditioned state  Plan for reassessment with home sleep study for further evaluation and diagnosis   I have discussed all options with patient at this time  Option #1 Try autoCPAP therapy 4-14 cm h20, with face mask of choice  Option #2  Assess for hypoglossal nerve stimulator after trying CPAP for 30 days  Option #3 Referral to dental office for oral appliance and repeat sleep study test  Option #4  Aggressive weight loss and repeat HST  Option #5 is to do nothing   Obesity -recommend significant weight loss -recommend changing diet  Deconditioned state -Recommend increased daily activity and exercise   MEDICATION ADJUSTMENTS/LABS AND TESTS ORDERED: Recommend home sleep Study Avoid Allergens and Irritants Avoid secondhand smoke Avoid SICK  contacts Recommend  Masking  when appropriate Recommend Keep up-to-date with vaccinations    CURRENT MEDICATIONS REVIEWED AT LENGTH WITH PATIENT TODAY   Patient  satisfied with Plan of action and management. All questions answered   Follow up 3 months   I spent a total of 62 minutes dedicated to the care of this patient on the date of this encounter to include pre-visit review of records, face-to-face time with the patient discussing conditions above, post visit ordering of testing, clinical documentation with the electronic health record, making appropriate referrals as documented, and communicating necessary information to the patient's healthcare team.     Nickolas Alm Cellar, M.D.  [facility]  Medical Director [facility] Medical Director [facility] Cardio-Pulmonary Department

## 2024-03-23 ENCOUNTER — Ambulatory Visit (INDEPENDENT_AMBULATORY_CARE_PROVIDER_SITE_OTHER): Admitting: *Deleted

## 2024-03-23 VITALS — Ht 67.0 in | Wt 207.0 lb

## 2024-03-23 DIAGNOSIS — Z0000 Encounter for general adult medical examination without abnormal findings: Secondary | ICD-10-CM

## 2024-03-23 NOTE — Progress Notes (Signed)
 Subjective:   Wesley Bridges is a 53 y.o. who presents for a Medicare Wellness preventive visit.  As a reminder, Annual Wellness Visits don't include a physical exam, and some assessments may be limited, especially if this visit is performed virtually. We may recommend an in-person follow-up visit with your provider if needed.  Visit Complete: Virtual I connected with  Wesley Bridges on 03/23/24 by a audio enabled telemedicine application and verified that I am speaking with the correct person using two identifiers.  Patient Location: Home  Provider Location: Home Office  I discussed the limitations of evaluation and management by telemedicine. The patient expressed understanding and agreed to proceed.  Vital Signs: Because this visit was a virtual/telehealth visit, some criteria may be missing or patient reported. Any vitals not documented were not able to be obtained and vitals that have been documented are patient reported.  VideoError- Librarian, academic were attempted between this provider and patient, however failed, due to patient having technical difficulties OR patient did not have access to video capability.  We continued and completed visit with audio only.   Persons Participating in Visit: Patient.  AWV Questionnaire: No: Patient Medicare AWV questionnaire was not completed prior to this visit.  Cardiac Risk Factors include: hypertension;male gender;obesity (BMI >30kg/m2);dyslipidemia;sedentary lifestyle     Objective:    Today's Vitals   03/23/24 0930 03/23/24 0931  Weight: 207 lb (93.9 kg)   Height: 5' 7 (1.702 m)   PainSc:  7    Body mass index is 32.42 kg/m.     03/23/2024    9:49 AM 11/15/2022   10:47 AM 08/15/2022    6:06 PM 12/21/2021    8:37 AM 11/06/2021   10:54 AM 05/01/2020    2:18 PM 09/01/2019    6:25 AM  Advanced Directives  Does Patient Have a Medical Advance Directive? No No No No No No No  Would patient  like information on creating a medical advance directive? No - Patient declined No - Patient declined  No - Patient declined No - Patient declined  No - Patient declined    Current Medications (verified) Outpatient Encounter Medications as of 03/23/2024  Medication Sig   albuterol  (VENTOLIN  HFA) 108 (90 Base) MCG/ACT inhaler Inhale 1-2 puffs into the lungs every 6 (six) hours as needed.   amLODipine  (NORVASC ) 5 MG tablet Take 1 tablet (5 mg total) by mouth daily.   aspirin  EC 81 MG tablet Take 81 mg by mouth daily.   atorvastatin  (LIPITOR) 80 MG tablet Take 1 tablet (80 mg total) by mouth daily.   azelastine  (ASTELIN ) 0.1 % nasal spray USE 2 SPRAYS IN EACH NOSTRIL TWICE DAILY AS DIRECTED   fluticasone  (FLONASE ) 50 MCG/ACT nasal spray USE 2 SPRAYS IN EACH NOSTRIL DAILY   levocetirizine (XYZAL  ALLERGY 24HR) 5 MG tablet Take 1 tablet (5 mg total) by mouth every evening.   meloxicam  (MOBIC ) 15 MG tablet TAKE 1 TABLET DAILY AS NEEDED FOR PAIN   Multiple Vitamin (MULTIVITAMIN WITH MINERALS) TABS tablet Take 1 tablet by mouth at bedtime.   nebivolol  (BYSTOLIC ) 2.5 MG tablet Take 1 tablet (2.5 mg total) by mouth daily.   nitroGLYCERIN  (NITROSTAT ) 0.4 MG SL tablet Place 1 tablet (0.4 mg total) under the tongue every 5 (five) minutes as needed for chest pain.   pantoprazole  (PROTONIX ) 40 MG tablet Take 1 tablet (40 mg total) by mouth 2 (two) times daily.   pregabalin  (LYRICA ) 100 MG capsule TAKE 1 CAPSULE (  100 MG TOTAL) BY MOUTH THREE TIMES DAILY.   terbinafine  (LAMISIL ) 250 MG tablet Take 1 tablet (250 mg total) by mouth daily. 12 weeks of treatment   topiramate  (TOPAMAX ) 100 MG tablet Take 1 tablet (100 mg total) by mouth 2 (two) times daily.   No facility-administered encounter medications on file as of 03/23/2024.    Allergies (verified) Bee pollen, Pollen extract, and Short ragweed pollen ext   History: Past Medical History:  Diagnosis Date   Allergy 2016   Anginal pain    normal cath 2018    Anxiety    Arthritis    Asthma    Broken jaw (HCC)    approx 25 yrs afo.  some limitations opening mouth   Chronic headaches    Complication of anesthesia    takes longer to wake up   Depression January 2018   Dyspnea    Dysrhythmia    Family history of adverse reaction to anesthesia    son takes longer to wake up than a normal person   GERD (gastroesophageal reflux disease)    Hyperlipidemia    Hypertension    Osteoporosis    Sleep apnea    cpap   Subclinical hyperthyroidism    Past Surgical History:  Procedure Laterality Date   ANKLE FRACTURE SURGERY     ANKLE FUSION     [facility]   CARDIAC CATHETERIZATION     no stent   COLONOSCOPY WITH PROPOFOL  N/A 12/21/2021   Procedure: COLONOSCOPY WITH PROPOFOL ;  Surgeon: Jinny Carmine, MD;  Location: [facility];  Service: Endoscopy;  Laterality: N/A;   ELBOW ARTHROSCOPY Right 09/01/2019   Procedure: RIGHT ELBOW ARTHROSCOPY WITH DEBRIDEMENT AND EXCISION OF LOOSE BODIES;  Surgeon: Edie Norleen PARAS, MD;  Location: [facility];  Service: Orthopedics;  Laterality: Right;   ESOPHAGOGASTRODUODENOSCOPY (EGD) WITH PROPOFOL  N/A 12/21/2021   Procedure: ESOPHAGOGASTRODUODENOSCOPY (EGD) WITH PROPOFOL ;  Surgeon: Jinny Carmine, MD;  Location: [facility];  Service: Endoscopy;  Laterality: N/A;   FEMUR FRACTURE SURGERY     FRACTURE SURGERY     HERNIA REPAIR Bilateral    inguinal   I & D EXTREMITY Right 07/30/2018   Procedure: DEBRIDEMENT OF THE COMMON EXTENSOR ORIGIN OF ELBOW;  Surgeon: Edie Norleen PARAS, MD;  Location: [facility];  Service: Orthopedics;  Laterality: Right;  1.45 BIOMET JUGGERKNOT ANCHORS   KNEE ARTHROSCOPY WITH MEDIAL MENISECTOMY Left 05/13/2019   Procedure: KNEE ARTHROSCOPY WITH DEBRIDEMENT AND REPAIR CH;  Surgeon: Edie Norleen PARAS, MD;  Location: [facility];  Service: Orthopedics;  Laterality: Left;   LEFT HEART CATH AND CORONARY ANGIOGRAPHY N/A 01/08/2017   Procedure: Left Heart Cath and Coronary Angiography;  Surgeon:  Mady Bruckner, MD;  Location: [facility];  Service: Cardiovascular;  Laterality: N/A;   MANDIBLE FRACTURE SURGERY     right elbow surgery  07/25/2020   [facility]   ROBOT ASSISTED INGUINAL HERNIA REPAIR Bilateral 04/17/2018   Procedure: ROBOT ASSISTED INGUINAL HERNIA REPAIR;  Surgeon: Jordis Laneta FALCON, MD;  Location: [facility];  Service: General;  Laterality: Bilateral;   Family History  Problem Relation Age of Onset   Hypertension Mother    Thyroid  disease Mother    Osteoporosis Mother    Syncope episode Mother    Arthritis Mother    Depression Mother    Heart disease Mother    Hypertension Father    Non-Hodgkin's lymphoma Father 50   Alcohol abuse Father    Cancer Father    Hypertension Brother  Arthritis Other        parents   Cancer Maternal Grandfather    Asthma Paternal Grandmother    Hypertension Brother    Asthma Daughter    Prostate cancer Neg Hx    Colon polyps Neg Hx    Thyroid  cancer Neg Hx    Social History   Socioeconomic History   Marital status: Married    Spouse name: Not on file   Number of children: 6   Years of education: Not on file   Highest education level: Some college, no degree  Occupational History   Not on file  Tobacco Use   Smoking status: Never   Smokeless tobacco: Never  Vaping Use   Vaping status: Never Used  Substance and Sexual Activity   Alcohol use: Yes    Alcohol/week: 14.0 standard drinks of alcohol    Types: 14 Cans of beer per week    Comment: 2-3 a day    Drug use: No   Sexual activity: Not Currently  Other Topics Concern   Not on file  Social History Narrative   Lives at home with family. Independent at baseline.   He is on disability   He has 7 children. From 32 yrs old to 70 year old.    Social Drivers of Health   Financial Resource Strain: Medium Risk (03/23/2024)   Overall Financial Resource Strain (CARDIA)    Difficulty of Paying Living Expenses: Somewhat hard  Food Insecurity: No Food  Insecurity (03/23/2024)   Hunger Vital Sign    Worried About Running Out of Food in the Last Year: Never true    Ran Out of Food in the Last Year: Never true  Recent Concern: Food Insecurity - Food Insecurity Present (01/10/2024)   Hunger Vital Sign    Worried About Running Out of Food in the Last Year: Often true    Ran Out of Food in the Last Year: Sometimes true  Transportation Needs: No Transportation Needs (03/23/2024)   PRAPARE - Administrator, Civil Service (Medical): No    Lack of Transportation (Non-Medical): No  Recent Concern: Transportation Needs - Unmet Transportation Needs (01/10/2024)   PRAPARE - Transportation    Lack of Transportation (Medical): Yes    Lack of Transportation (Non-Medical): Yes  Physical Activity: Inactive (03/23/2024)   Exercise Vital Sign    Days of Exercise per Week: 0 days    Minutes of Exercise per Session: 0 min  Stress: No Stress Concern Present (03/23/2024)   [employer] of Occupational Health - Occupational Stress Questionnaire    Feeling of Stress: Only a little  Recent Concern: Stress - Stress Concern Present (01/10/2024)   [employer] of Occupational Health - Occupational Stress Questionnaire    Feeling of Stress: To some extent  Social Connections: Moderately Isolated (03/23/2024)   Social Connection and Isolation Panel    Frequency of Communication with Friends and Family: More than three times a week    Frequency of Social Gatherings with Friends and Family: More than three times a week    Attends Religious Services: Never    Database administrator or Organizations: No    Attends Engineer, structural: Never    Marital Status: Married    Tobacco Counseling Counseling given: Not Answered    Clinical Intake:  Pre-visit preparation completed: Yes  Pain : 0-10 (arthritis pain) Pain Score: 7  Pain Type: Chronic pain Pain Location: Hip Pain Orientation: Left Pain Descriptors / Indicators:  Aching Pain  Onset: More than a month ago Pain Frequency: Constant     BMI - recorded: 32.42 Nutritional Status: BMI > 30  Obese Nutritional Risks: None Diabetes: No  Lab Results  Component Value Date   HGBA1C 5.6 10/03/2023   HGBA1C 5.7 04/29/2023   HGBA1C 5.8 09/03/2022     How often do you need to have someone help you when you read instructions, pamphlets, or other written materials from your doctor or pharmacy?: 1 - Never  Interpreter Needed?: No  Information entered by :: R. Trishia Cuthrell LPN   Activities of Daily Living     03/23/2024    9:33 AM  In your present state of health, do you have any difficulty performing the following activities:  Hearing? 0  Vision? 0  Difficulty concentrating or making decisions? 1  Walking or climbing stairs? 1  Dressing or bathing? 0  Doing errands, shopping? 1  Preparing Food and eating ? N  Using the Toilet? N  In the past six months, have you accidently leaked urine? N  Do you have problems with loss of bowel control? N  Managing your Medications? N  Managing your Finances? N  Housekeeping or managing your Housekeeping? Y    Patient Care Team: Dineen Rollene MATSU, FNP as PCP - General (Family Medicine) End, Lonni, MD as PCP - Cardiology (Cardiology) Maree Jannett POUR, MD as Consulting Physician (Neurology) Isaiah Scrivener, MD as Consulting Physician (Pulmonary Disease)  I have updated your Care Teams any recent Medical Services you may have received from other providers in the past year.     Assessment:   This is a routine wellness examination for [facility].  Hearing/Vision screen Hearing Screening - Comments:: No issues Vision Screening - Comments:: glasses   Goals Addressed             This Visit's Progress    Patient Stated       Wants to try to lose some weight       Depression Screen     03/23/2024    9:41 AM 02/27/2024   10:30 AM 02/27/2024   10:22 AM 01/14/2024   11:00 AM 11/11/2023    9:18 AM 10/03/2023    9:19 AM  10/03/2023    9:18 AM  PHQ 2/9 Scores  PHQ - 2 Score 0 2 0 2 0 0 0  PHQ- 9 Score 2 6 0 5 0 0     Fall Risk     03/23/2024    9:36 AM 02/27/2024   10:22 AM 01/14/2024   11:00 AM 11/11/2023    9:18 AM 10/03/2023    9:17 AM  Fall Risk   Falls in the past year? 1 0 0 0 0  Number falls in past yr: 1 0 0 0 0  Injury with Fall? 0 0 0 0 0  Risk for fall due to : History of fall(s);Impaired balance/gait No Fall Risks No Fall Risks No Fall Risks No Fall Risks  Follow up Falls evaluation completed;Falls prevention discussed Falls evaluation completed Falls evaluation completed Falls evaluation completed Falls evaluation completed    MEDICARE RISK AT HOME:  Medicare Risk at Home Any stairs in or around the home?: Yes If so, are there any without handrails?: No Home free of loose throw rugs in walkways, pet beds, electrical cords, etc?: Yes Adequate lighting in your home to reduce risk of falls?: Yes Life alert?: No Use of a cane, walker or w/c?: Yes Grab bars in the  bathroom?: No Shower chair or bench in shower?: Yes Elevated toilet seat or a handicapped toilet?: No  TIMED UP AND GO:  Was the test performed?  No  Cognitive Function: 6CIT completed        03/23/2024    9:50 AM 11/15/2022   10:41 AM 11/06/2021   10:57 AM  6CIT Screen  What Year? 0 points 0 points 0 points  What month? 0 points 0 points 0 points  What time? 0 points 0 points 0 points  Count back from 20 0 points 0 points   Months in reverse 2 points 0 points 0 points  Repeat phrase 4 points 0 points   Total Score 6 points 0 points     Immunizations Immunization History  Administered Date(s) Administered   Influenza, Seasonal, Injecte, Preservative Fre 04/09/2012, 04/29/2023, 02/27/2024   Influenza,inj,Quad PF,6+ Mos 04/08/2013, 04/11/2015, 03/22/2016, 03/26/2017, 04/02/2018, 04/07/2019, 04/26/2021, 02/19/2022   Influenza-Unspecified 04/02/2018, 04/07/2019   Moderna Sars-Covid-2 Vaccination 08/28/2019, 09/25/2019    PFIZER Comirnaty(Gray Top)Covid-19 Tri-Sucrose Vaccine 04/28/2020   Tdap 04/02/2018, 05/01/2020    Screening Tests Health Maintenance  Topic Date Due   Hepatitis B Vaccines 19-59 Average Risk (1 of 3 - 19+ 3-dose series) Never done   Pneumococcal Vaccine: 50+ Years (1 of 1 - PCV) Never done   Zoster Vaccines- Shingrix (1 of 2) Never done   COVID-19 Vaccine (4 - 2025-26 season) 02/24/2024   Medicare Annual Wellness (AWV)  03/23/2025   DTaP/Tdap/Td (3 - Td or Tdap) 05/01/2030   Colonoscopy  12/22/2031   Influenza Vaccine  Completed   Hepatitis C Screening  Completed   HIV Screening  Completed   HPV VACCINES  Aged Out   Meningococcal B Vaccine  Aged Out    Health Maintenance Items Addressed: Discussed the need to update pneumonia and shingles vaccines. Patient wants to discuss covid vaccine with PCP at upcoming appointment.  Additional Screening:  Vision Screening: Recommended annual ophthalmology exams for early detection of glaucoma and other disorders of the eye. Is the patient up to date with their annual eye exam?  Yes  Who is the provider or what is the name of the office in which the patient attends annual eye exams? [facility]  Dental Screening: Recommended annual dental exams for proper oral hygiene  Community Resource Referral / Chronic Care Management: CRR required this visit?  No   CCM required this visit?  No   Plan:    I have personally reviewed and noted the following in the patient's chart:   Medical and social history Use of alcohol, tobacco or illicit drugs  Current medications and supplements including opioid prescriptions. Patient is not currently taking opioid prescriptions. Functional ability and status Nutritional status Physical activity Advanced directives List of other physicians Hospitalizations, surgeries, and ER visits in previous 12 months Vitals Screenings to include cognitive, depression, and falls Referrals and  appointments  In addition, I have reviewed and discussed with patient certain preventive protocols, quality metrics, and best practice recommendations. A written personalized care plan for preventive services as well as general preventive health recommendations were provided to patient.   Angeline Fredericks, LPN   [id number]   After Visit Summary: (MyChart) Due to this being a telephonic visit, the after visit summary with patients personalized plan was offered to patient via MyChart   Notes: Nothing significant to report at this time.  Patient needs pneumonia vaccine.

## 2024-03-23 NOTE — Patient Instructions (Signed)
 Wesley Bridges,  Thank you for taking the time for your Medicare Wellness Visit. I appreciate your continued commitment to your health goals. Please review the care plan we discussed, and feel free to reach out if I can assist you further.  Medicare recommends these wellness visits once per year to help you and your care team stay ahead of potential health issues. These visits are designed to focus on prevention, allowing your provider to concentrate on managing your acute and chronic conditions during your regular appointments.  Please note that Annual Wellness Visits do not include a physical exam. Some assessments may be limited, especially if the visit was conducted virtually. If needed, we may recommend a separate in-person follow-up with your provider.  Ongoing Care Seeing your primary care provider every 3 to 6 months helps us  monitor your health and provide consistent, personalized care.  Remember to update your pneumonia and shingles vaccines and discuss the covid vaccine at your upcoming office visit with your provider.  Referrals If a referral was made during today's visit and you haven't received any updates within two weeks, please contact the referred provider directly to check on the status.  Recommended Screenings:  Health Maintenance  Topic Date Due   Hepatitis B Vaccine (1 of 3 - 19+ 3-dose series) Never done   Pneumococcal Vaccine for age over 31 (1 of 1 - PCV) Never done   Zoster (Shingles) Vaccine (1 of 2) Never done   COVID-19 Vaccine (4 - 2025-26 season) 02/24/2024   Medicare Annual Wellness Visit  03/23/2025   DTaP/Tdap/Td vaccine (3 - Td or Tdap) 05/01/2030   Colon Cancer Screening  12/22/2031   Flu Shot  Completed   Hepatitis C Screening  Completed   HIV Screening  Completed   HPV Vaccine  Aged Out   Meningitis B Vaccine  Aged Out       03/23/2024    9:49 AM  Advanced Directives  Does Patient Have a Medical Advance Directive? No  Would patient like  information on creating a medical advance directive? No - Patient declined   Advance Care Planning is important because it: Ensures you receive medical care that aligns with your values, goals, and preferences. Provides guidance to your family and loved ones, reducing the emotional burden of decision-making during critical moments.  Vision: Annual vision screenings are recommended for early detection of glaucoma, cataracts, and diabetic retinopathy. These exams can also reveal signs of chronic conditions such as diabetes and high blood pressure.  Dental: Annual dental screenings help detect early signs of oral cancer, gum disease, and other conditions linked to overall health, including heart disease and diabetes.  Please see the attached documents for additional preventive care recommendations.

## 2024-03-24 ENCOUNTER — Encounter: Payer: Self-pay | Admitting: Internal Medicine

## 2024-03-24 NOTE — Telephone Encounter (Signed)
 The order was sent to [facility] on 03/12/24 has the patient tried to contact them

## 2024-03-31 ENCOUNTER — Other Ambulatory Visit: Payer: Self-pay | Admitting: Orthopedic Surgery

## 2024-03-31 DIAGNOSIS — M1612 Unilateral primary osteoarthritis, left hip: Secondary | ICD-10-CM

## 2024-04-01 ENCOUNTER — Other Ambulatory Visit: Payer: Self-pay | Admitting: *Deleted

## 2024-04-01 ENCOUNTER — Other Ambulatory Visit: Payer: Self-pay

## 2024-04-01 ENCOUNTER — Other Ambulatory Visit (INDEPENDENT_AMBULATORY_CARE_PROVIDER_SITE_OTHER)

## 2024-04-01 DIAGNOSIS — G6289 Other specified polyneuropathies: Secondary | ICD-10-CM | POA: Diagnosis not present

## 2024-04-01 DIAGNOSIS — B351 Tinea unguium: Secondary | ICD-10-CM

## 2024-04-01 DIAGNOSIS — M5412 Radiculopathy, cervical region: Secondary | ICD-10-CM

## 2024-04-01 LAB — B12 AND FOLATE PANEL
Folate: 19.7 ng/mL (ref >5.9)
Vitamin B-12: 387 pg/mL (ref 211–911)

## 2024-04-01 LAB — HEPATIC FUNCTION PANEL
ALT: 20 U/L (ref 0–53)
AST: 18 U/L (ref 0–37)
Albumin: 4 g/dL (ref 3.5–5.2)
Alkaline Phosphatase: 69 U/L (ref 39–117)
Bilirubin, Direct: 0.1 mg/dL (ref 0.0–0.3)
Total Bilirubin: 0.4 mg/dL (ref 0.2–1.2)
Total Protein: 6.5 g/dL (ref 6.0–8.3)

## 2024-04-01 NOTE — Addendum Note (Signed)
 Addended by: BRIEN SHARENE RAMAN on: 04/01/2024 09:29 AM   Modules accepted: Orders

## 2024-04-02 ENCOUNTER — Ambulatory Visit: Payer: Self-pay | Admitting: Family

## 2024-04-05 ENCOUNTER — Encounter

## 2024-04-05 DIAGNOSIS — G4733 Obstructive sleep apnea (adult) (pediatric): Secondary | ICD-10-CM

## 2024-04-09 ENCOUNTER — Ambulatory Visit (INDEPENDENT_AMBULATORY_CARE_PROVIDER_SITE_OTHER): Admitting: Family

## 2024-04-09 VITALS — BP 130/74 | HR 60 | Temp 97.8°F | Ht 67.0 in | Wt 211.2 lb

## 2024-04-09 DIAGNOSIS — J309 Allergic rhinitis, unspecified: Secondary | ICD-10-CM

## 2024-04-09 DIAGNOSIS — R519 Headache, unspecified: Secondary | ICD-10-CM

## 2024-04-09 MED ORDER — PREDNISONE 10 MG PO TABS: ORAL_TABLET | ORAL | 0 refills | Status: DC

## 2024-04-09 NOTE — Progress Notes (Signed)
 Assessment & Plan:  Headache disorder Assessment & Plan: Chronic, poorly controlled.HA presentation is not c/w migraine at this time.  Discussed triggers including neck pain, allergies. He has a h/o OSA.  Continue topamax  100mg  . Referral back to neurology for evaluation and treatment. Pending MRI brain. Provided prednisone  to break cycle today. Counseled on side effects.   Orders: -     Ambulatory referral to Neurology -     predniSONE ; Take 4 tablets ( total 40 mg) by mouth for 2 days; take 3 tablets ( total 30 mg) by mouth for 2 days; take 2 tablets ( total 20 mg) by mouth for 1 day; take 1 tablet ( total 10 mg) by mouth for 1 day.  Dispense: 17 tablet; Refill: 0 -     MR BRAIN W WO CONTRAST; Future  Allergic rhinitis, unspecified seasonality, unspecified trigger Assessment & Plan: Chronic, uncontrolled despite use of Flonase , Xyzal , and azelastine  nasal spray. Referral to allergy.   Orders: -     Ambulatory referral to Allergy -     predniSONE ; Take 4 tablets ( total 40 mg) by mouth for 2 days; take 3 tablets ( total 30 mg) by mouth for 2 days; take 2 tablets ( total 20 mg) by mouth for 1 day; take 1 tablet ( total 10 mg) by mouth for 1 day.  Dispense: 17 tablet; Refill: 0     Return precautions given.   Risks, benefits, and alternatives of the medications and treatment plan prescribed today were discussed, and patient expressed understanding.   Education regarding symptom management and diagnosis given to patient on AVS either electronically or printed.  Return in about 3 months (around 07/10/2024).  Rollene Northern, FNP  Subjective:    Patient ID: Wesley Bridges, male    DOB: Jan 09, 1971, 53 y.o.   MRN: [id number]  CC: Wesley Bridges is a 53 y.o. male who presents today for follow up.   HPI: HPI Discussed the use of AI scribe software for clinical note transcription with the patient, who gave verbal consent to proceed.  History of Present Illness   Wesley Bridges is a 53 year old male who presents with ongoing daily headaches.  He experiences continuous headaches, primarily described as a band-like sensation across the top of his head. He reports that these headaches occur daily. The headaches are present upon waking and persist throughout the day. He has photophobia, particularly to sunlight, which he finds bothersome. He uses Topamax  100 mg twice daily, which he reports helps control the severity of his headaches and prevents them from escalating into severe migraines. Despite this treatment, he continues to experience daily headaches. No HA with valsalva. HA doesn't awaken him from sleep.   He previously used Ajovy injections in combination with Topamax , which he found effective, but discontinued Ajovy due to insurance changes that made it unaffordable. He has not had an MRI since 2018 and last saw his neurologist in October 2021.  He reports symptoms suggestive of allergies, including morning nasal congestion and frequent sneezing. He uses Flonase , Xyzal , and azelastine  nasal spray for these symptoms. Despite this regimen, he continues to experience significant nasal congestion and sneezing, particularly in the mornings. Endorses hoarseness ' raspy voice'. No trouble swallowing or pain with swallowing. No weight loss. No choking. Compliant with protonix  40mg  BID.  Eye exam is UTD    Tried Ajovy, effexor  , nortriptyline, clonazepam  No longer on cymbalta   Compliant with topamax  100mg  BID  Pending  home sleep study ordered by Dr Isaiah  [facility] with [facility] neurology for neck pain  MRI brain 2018 1. Normal MRI of the brain. 2. Chronic sinusitis. The right maxillary sinus is completely opacified/obstructed, new from 2016.  Last seen Dr Lane 04/18/2020   He has been on prednisone  in the past   No h/o preDM  Allergies: Bee pollen, Pollen extract, and Short ragweed pollen ext Current Outpatient Medications on File  Prior to Visit  Medication Sig Dispense Refill   albuterol  (VENTOLIN  HFA) 108 (90 Base) MCG/ACT inhaler Inhale 1-2 puffs into the lungs every 6 (six) hours as needed. 8 g 0   amLODipine  (NORVASC ) 5 MG tablet Take 1 tablet (5 mg total) by mouth daily. 90 tablet 3   aspirin  EC 81 MG tablet Take 81 mg by mouth daily.     atorvastatin  (LIPITOR) 80 MG tablet Take 1 tablet (80 mg total) by mouth daily. 90 tablet 3   azelastine  (ASTELIN ) 0.1 % nasal spray USE 2 SPRAYS IN EACH NOSTRIL TWICE DAILY AS DIRECTED 120 mL 3   fluticasone  (FLONASE ) 50 MCG/ACT nasal spray USE 2 SPRAYS IN EACH NOSTRIL DAILY 48 g 3   levocetirizine (XYZAL  ALLERGY 24HR) 5 MG tablet Take 1 tablet (5 mg total) by mouth every evening. 90 tablet 3   meloxicam  (MOBIC ) 15 MG tablet TAKE 1 TABLET DAILY AS NEEDED FOR PAIN 90 tablet 1   Multiple Vitamin (MULTIVITAMIN WITH MINERALS) TABS tablet Take 1 tablet by mouth at bedtime.     nebivolol  (BYSTOLIC ) 2.5 MG tablet Take 1 tablet (2.5 mg total) by mouth daily. 90 tablet 3   nitroGLYCERIN  (NITROSTAT ) 0.4 MG SL tablet Place 1 tablet (0.4 mg total) under the tongue every 5 (five) minutes as needed for chest pain. 25 tablet 0   pantoprazole  (PROTONIX ) 40 MG tablet Take 1 tablet (40 mg total) by mouth 2 (two) times daily. 180 tablet 3   pregabalin  (LYRICA ) 100 MG capsule TAKE 1 CAPSULE (100 MG TOTAL) BY MOUTH THREE TIMES DAILY. 90 capsule 2   terbinafine  (LAMISIL ) 250 MG tablet Take 1 tablet (250 mg total) by mouth daily. 12 weeks of treatment 90 tablet 0   topiramate  (TOPAMAX ) 100 MG tablet Take 1 tablet (100 mg total) by mouth 2 (two) times daily. 180 tablet 3   No current facility-administered medications on file prior to visit.    Review of Systems  Constitutional:  Negative for chills and fever.  HENT:  Positive for congestion, sinus pressure and voice change. Negative for trouble swallowing.   Eyes:  Positive for photophobia. Negative for visual disturbance.  Respiratory:  Negative for  cough.   Cardiovascular:  Negative for chest pain and palpitations.  Gastrointestinal:  Negative for nausea and vomiting.  Neurological:  Positive for headaches. Negative for dizziness.      Objective:    BP 130/74   Pulse 60   Temp 97.8 F (36.6 C) (Oral)   Ht 5' 7 (1.702 m)   Wt 211 lb 3.2 oz (95.8 kg)   SpO2 99%   BMI 33.08 kg/m  BP Readings from Last 3 Encounters:  04/09/24 130/74  03/11/24 130/80  02/27/24 130/86   Wt Readings from Last 3 Encounters:  04/09/24 211 lb 3.2 oz (95.8 kg)  03/23/24 207 lb (93.9 kg)  03/11/24 209 lb 6.4 oz (95 kg)    Physical Exam Vitals reviewed.  Constitutional:      Appearance: He is well-developed.  HENT:  Head: Normocephalic and atraumatic.     Right Ear: Hearing, tympanic membrane, ear canal and external ear normal. No decreased hearing noted. No drainage, swelling or tenderness. No middle ear effusion. Tympanic membrane is not injected, erythematous or bulging.     Left Ear: Hearing, tympanic membrane, ear canal and external ear normal. No decreased hearing noted. No drainage, swelling or tenderness.  No middle ear effusion. Tympanic membrane is not injected, erythematous or bulging.     Nose: Congestion present.     Right Sinus: Maxillary sinus tenderness present. No frontal sinus tenderness.     Left Sinus: Maxillary sinus tenderness present. No frontal sinus tenderness.     Mouth/Throat:     Pharynx: Uvula midline. No oropharyngeal exudate or posterior oropharyngeal erythema.     Tonsils: No tonsillar abscesses.  Eyes:     General: Lids are normal. Lids are everted, no foreign bodies appreciated.     Conjunctiva/sclera: Conjunctivae normal.     Pupils: Pupils are equal, round, and reactive to light.     Comments: Normal fundus bilaterally   Cardiovascular:     Rate and Rhythm: Regular rhythm.     Heart sounds: Normal heart sounds.  Pulmonary:     Effort: Pulmonary effort is normal. No respiratory distress.     Breath  sounds: Normal breath sounds. No wheezing, rhonchi or rales.  Lymphadenopathy:     Head:     Right side of head: No submental, submandibular, tonsillar, preauricular, posterior auricular or occipital adenopathy.     Left side of head: No submental, submandibular, tonsillar, preauricular, posterior auricular or occipital adenopathy.     Cervical: No cervical adenopathy.  Skin:    General: Skin is warm and dry.  Neurological:     Mental Status: He is alert.     Cranial Nerves: No cranial nerve deficit.     Sensory: No sensory deficit.     Deep Tendon Reflexes:     Reflex Scores:      Bicep reflexes are 2+ on the right side and 2+ on the left side.      Patellar reflexes are 2+ on the right side and 2+ on the left side.    Comments: Grip equal and strong bilateral upper extremities. Gait strong and steady. Able to perform  finger-to-nose without difficulty.   Psychiatric:        Speech: Speech normal.        Behavior: Behavior normal.

## 2024-04-09 NOTE — Assessment & Plan Note (Addendum)
 Chronic, poorly controlled.HA presentation is not c/w migraine at this time.  Discussed triggers including neck pain, allergies. He has a h/o OSA.  Continue topamax  100mg  . Referral back to neurology for evaluation and treatment. Pending MRI brain. Provided prednisone  to break cycle today. Counseled on side effects.

## 2024-04-09 NOTE — Assessment & Plan Note (Signed)
 Chronic, uncontrolled despite use of Flonase , Xyzal , and azelastine  nasal spray. Referral to allergy.

## 2024-04-09 NOTE — Patient Instructions (Signed)
 Start prednisone  tomorrow morning and get all doses in by noon each day as can interfere with sleep  Referral to Dr Lane ( neurology) and I have order MRI brain  Let us  know if you dont hear back within 2 weeks in regards to an appointment being scheduled.   So that you are aware, if you are [facility] MyChart user , please pay attention to your MyChart messages as you may receive a MyChart message with a phone number to call and schedule this test/appointment own your own from our referral coordinator. This is a new process so I do not want you to miss this message.  If you are not a MyChart user, you will receive a phone call.

## 2024-04-10 ENCOUNTER — Ambulatory Visit: Admitting: Internal Medicine

## 2024-04-14 DIAGNOSIS — G4733 Obstructive sleep apnea (adult) (pediatric): Secondary | ICD-10-CM | POA: Diagnosis not present

## 2024-04-15 ENCOUNTER — Ambulatory Visit: Payer: Self-pay | Admitting: Internal Medicine

## 2024-04-15 DIAGNOSIS — G4733 Obstructive sleep apnea (adult) (pediatric): Secondary | ICD-10-CM

## 2024-04-21 ENCOUNTER — Inpatient Hospital Stay
Admission: RE | Admit: 2024-04-21 | Discharge: 2024-04-21 | Disposition: A | Source: Ambulatory Visit | Attending: Orthopedic Surgery | Admitting: Orthopedic Surgery

## 2024-04-21 ENCOUNTER — Ambulatory Visit
Admission: RE | Admit: 2024-04-21 | Discharge: 2024-04-21 | Disposition: A | Discharge status: Home / Self Care | Source: Ambulatory Visit | Attending: Family | Admitting: Family

## 2024-04-21 DIAGNOSIS — R519 Headache, unspecified: Secondary | ICD-10-CM

## 2024-04-21 DIAGNOSIS — M1612 Unilateral primary osteoarthritis, left hip: Secondary | ICD-10-CM

## 2024-04-21 MED ORDER — GADOPICLENOL 0.5 MMOL/ML IV SOLN
10.0000 mL | Freq: Once | INTRAVENOUS | Status: AC | PRN
  Administered 2024-04-21: 10 mL via INTRAVENOUS

## 2024-04-23 ENCOUNTER — Other Ambulatory Visit: Payer: Self-pay | Admitting: Family

## 2024-04-23 MED ORDER — TOPIRAMATE 100 MG PO TABS: 100.0000 mg | ORAL_TABLET | Freq: Two times a day (BID) | ORAL | 3 refills | Status: AC

## 2024-04-27 ENCOUNTER — Ambulatory Visit: Payer: Self-pay | Admitting: Family

## 2024-05-07 NOTE — Telephone Encounter (Signed)
 Sorry about that. I have now sent the cpap order to [facility]

## 2024-05-25 ENCOUNTER — Encounter: Payer: Self-pay | Admitting: Family

## 2024-05-25 ENCOUNTER — Other Ambulatory Visit: Payer: Self-pay

## 2024-05-25 DIAGNOSIS — I10 Essential (primary) hypertension: Secondary | ICD-10-CM

## 2024-05-25 MED ORDER — AMLODIPINE BESYLATE 5 MG PO TABS: 5.0000 mg | ORAL_TABLET | Freq: Every day | ORAL | 3 refills | Status: DC

## 2024-05-26 ENCOUNTER — Encounter: Payer: Self-pay | Admitting: Allergy and Immunology

## 2024-05-26 ENCOUNTER — Other Ambulatory Visit: Payer: Self-pay | Admitting: Allergy and Immunology

## 2024-05-26 ENCOUNTER — Other Ambulatory Visit: Payer: Self-pay

## 2024-05-26 ENCOUNTER — Ambulatory Visit: Admitting: Allergy and Immunology

## 2024-05-26 VITALS — BP 128/80 | HR 56 | Temp 98.2°F | Resp 18 | Ht 65.25 in | Wt 210.5 lb

## 2024-05-26 DIAGNOSIS — J4520 Mild intermittent asthma, uncomplicated: Secondary | ICD-10-CM | POA: Diagnosis not present

## 2024-05-26 DIAGNOSIS — T7840XS Allergy, unspecified, sequela: Secondary | ICD-10-CM

## 2024-05-26 DIAGNOSIS — J3089 Other allergic rhinitis: Secondary | ICD-10-CM | POA: Diagnosis not present

## 2024-05-26 DIAGNOSIS — K219 Gastro-esophageal reflux disease without esophagitis: Secondary | ICD-10-CM

## 2024-05-26 DIAGNOSIS — J309 Allergic rhinitis, unspecified: Secondary | ICD-10-CM

## 2024-05-26 DIAGNOSIS — B9689 Other specified bacterial agents as the cause of diseases classified elsewhere: Secondary | ICD-10-CM

## 2024-05-26 MED ORDER — FLUTICASONE PROPIONATE 50 MCG/ACT NA SUSP: 2.0000 | Freq: Every day | NASAL | 3 refills | Status: DC

## 2024-05-26 MED ORDER — ALBUTEROL SULFATE HFA 108 (90 BASE) MCG/ACT IN AERS: 1.0000 | INHALATION_SPRAY | Freq: Four times a day (QID) | RESPIRATORY_TRACT | 0 refills | Status: DC | PRN

## 2024-05-26 MED ORDER — AZELASTINE HCL 0.1 % NA SOLN: 1.0000 | Freq: Two times a day (BID) | NASAL | 3 refills | Status: DC

## 2024-05-26 MED ORDER — MONTELUKAST SODIUM 10 MG PO TABS: 10.0000 mg | ORAL_TABLET | Freq: Two times a day (BID) | ORAL | 2 refills | Status: AC

## 2024-05-26 MED ORDER — FLUTICASONE PROPIONATE HFA 44 MCG/ACT IN AERO: 2.0000 | INHALATION_SPRAY | Freq: Four times a day (QID) | RESPIRATORY_TRACT | 6 refills | Status: DC | PRN

## 2024-05-26 MED ORDER — FAMOTIDINE 40 MG PO TABS: 40.0000 mg | ORAL_TABLET | Freq: Every evening | ORAL | 2 refills | Status: DC

## 2024-05-26 NOTE — Patient Instructions (Addendum)
  1. Return to clinic for skin testing (no antihistamines)  2. Treat and prevent inflammation of airway:   A. Fluticasone  - 1 spray each nostril 2 times per day  B. Azelastine  - 1 spray each nostril 2 times per day  C. Montelukast 10 mg - 1 tablet 2 times per day  3. Treat and prevent reflux induced inflammation of airway:   A.  Replace all throat clearing with swallowing/drinking maneuver  B.  Consolidate caffeine consumption  C.  Continue pantoprazole  40 mg twice a day  D.  Start famotidine 40 mg tablet in the evening  4. If needed:   A. Nasal saline  B. Albuterol  + fluticasone  44 - 2 inhalations TOGETHER every 4-6 hours (empty lungs)  C. Cetirizine 10 mg - 1 tablet 1-2 times per day  5. Immunotherapy???  6. Influenza = Tamiflu . Covid = Paxlovid

## 2024-05-26 NOTE — Progress Notes (Unsigned)
  - [place] - [place] - [place]   Dear Wesley Bridges,  Thank you for referring Wesley Bridges to the [facility] [place]  on 05/26/2024.   Below is a summation of this patient's evaluation and recommendations.  Thank you for your referral. I will keep you informed about this patient's response to treatment.   If you have any questions please do not hesitate to contact me.   Sincerely,  Wesley DOROTHA Denis, MD Allergy / Immunology [facility] [place]    ______________________________________________________________________    NEW PATIENT NOTE  Referring Provider: [place] Wesley MATSU, FNP Primary Provider: [place] Wesley MATSU, FNP Date of office visit: 05/26/2024    Subjective:   Chief Complaint:  Wesley Bridges (DOB: 04-11-1971) is a 53 y.o. male who presents to the clinic on 05/26/2024 with a chief complaint of Allergic Rhinitis  (Patient states that he was referred to our office by his PCP. He states that he has been taking OTC allergy medication and prescribed allergy medications. He states that he knows that he is allergic to some environmentals (ie: RW, b-pollen, etc). He states that he sneezes and has a raspy voice all time. He states that he constantly clears his throat and has dry eyes.) .     HPI: [name] to this clinic in evaluation of allergies.  He states that he has a history of seasonal allergic rhinitis with nasal congestion and sneezing and maybe some itchy eyes that has assumed a perennial pattern over the course of the past 3 years.  Even in the face of utilizing multiple medications including nasal antihistamines and nasal steroids and oral antihistamines he remains symptomatic.  He does not really note an obvious provoking factor giving rise to this issue.  He does not have any anosmia or ugly nasal discharge or history of recurrent  headaches.  He also has constant postnasal drip and throat clearing and a coating in his throat and raspy voice.  He does have reflux disease with regurgitation and burning which appears to be active even if he uses pantoprazole  twice a day.  He drinks 3 caffeinated sodas in the form of [facility] every day.  He has some alcohol on the weekends.  He does not consume any chocolate.  He apparently has a history of childhood asthma that for the most part has resolved.  He can exercise with any difficulty.  He can have cold air exposure without any difficulty.  But if he gets a respiratory tract infection he will develop some wheezing and coughing and it is at the point when he uses a short acting bronchodilator very intermittently maybe just a few times a year.  Past Medical History:  Diagnosis Date   Allergy 2016   Anginal pain    normal cath 2018   Anxiety    Arthritis    Asthma    Broken jaw (HCC)    approx 25 yrs afo.  some limitations opening mouth   Chronic headaches    Complication of anesthesia    takes longer to wake up   Depression January 2018   Dyspnea    Dysrhythmia    Family history of adverse reaction to anesthesia    son takes longer to wake up than a normal person   GERD (gastroesophageal reflux disease)    Hyperlipidemia    Hypertension    Osteoporosis    Sleep apnea  cpap   Subclinical hyperthyroidism     Past Surgical History:  Procedure Laterality Date   ANKLE FRACTURE SURGERY     ANKLE FUSION     [facility]   CARDIAC CATHETERIZATION     no stent   COLONOSCOPY WITH PROPOFOL  N/A 12/21/2021   Procedure: COLONOSCOPY WITH PROPOFOL ;  Surgeon: Wesley Carmine, MD;  Location: [facility];  Service: Endoscopy;  Laterality: N/A;   ELBOW ARTHROSCOPY Right 09/01/2019   Procedure: RIGHT ELBOW ARTHROSCOPY WITH DEBRIDEMENT AND EXCISION OF LOOSE BODIES;  Surgeon: Wesley Norleen PARAS, MD;  Location: [facility];  Service: Orthopedics;  Laterality: Right;    ESOPHAGOGASTRODUODENOSCOPY (EGD) WITH PROPOFOL  N/A 12/21/2021   Procedure: ESOPHAGOGASTRODUODENOSCOPY (EGD) WITH PROPOFOL ;  Surgeon: Wesley Carmine, MD;  Location: [facility];  Service: Endoscopy;  Laterality: N/A;   FEMUR FRACTURE SURGERY     FRACTURE SURGERY     HERNIA REPAIR Bilateral    inguinal   I & D EXTREMITY Right 07/30/2018   Procedure: DEBRIDEMENT OF THE COMMON EXTENSOR ORIGIN OF ELBOW;  Surgeon: Wesley Norleen PARAS, MD;  Location: [facility];  Service: Orthopedics;  Laterality: Right;  1.45 BIOMET JUGGERKNOT ANCHORS   KNEE ARTHROSCOPY WITH MEDIAL MENISECTOMY Left 05/13/2019   Procedure: KNEE ARTHROSCOPY WITH DEBRIDEMENT AND REPAIR CH;  Surgeon: Wesley Norleen PARAS, MD;  Location: [facility];  Service: Orthopedics;  Laterality: Left;   LEFT HEART CATH AND CORONARY ANGIOGRAPHY N/A 01/08/2017   Procedure: Left Heart Cath and Coronary Angiography;  Surgeon: Wesley Bruckner, MD;  Location: [facility];  Service: Cardiovascular;  Laterality: N/A;   MANDIBLE FRACTURE SURGERY     right elbow surgery  07/25/2020   [facility]   ROBOT ASSISTED INGUINAL HERNIA REPAIR Bilateral 04/17/2018   Procedure: ROBOT ASSISTED INGUINAL HERNIA REPAIR;  Surgeon: Wesley Laneta FALCON, MD;  Location: [facility];  Service: General;  Laterality: Bilateral;    Allergies as of 05/26/2024       Reactions   Bee Pollen Cough, Other (See Comments)   Cough runny eyes   Pollen Extract Cough, Other (See Comments)   Short Ragweed Pollen Ext Other (See Comments), Cough        Medication List    albuterol  108 (90 Base) MCG/ACT inhaler Commonly known as: VENTOLIN  HFA Inhale 1-2 puffs into the lungs every 6 (six) hours as needed.   amLODipine  5 MG tablet Commonly known as: NORVASC  Take 1 tablet (5 mg total) by mouth daily.   aspirin  EC 81 MG tablet Take 81 mg by mouth daily.   atorvastatin  80 MG tablet Commonly known as: LIPITOR Take 1 tablet (80 mg total) by mouth daily.   azelastine  0.1 %  nasal spray Commonly known as: ASTELIN  USE 2 SPRAYS IN EACH NOSTRIL TWICE DAILY AS DIRECTED   fluticasone  50 MCG/ACT nasal spray Commonly known as: FLONASE  USE 2 SPRAYS IN EACH NOSTRIL DAILY   levocetirizine 5 MG tablet Commonly known as: Xyzal  Allergy 24HR Take 1 tablet (5 mg total) by mouth every evening.   meloxicam  15 MG tablet Commonly known as: MOBIC  TAKE 1 TABLET DAILY AS NEEDED FOR PAIN   multivitamin with minerals Tabs tablet Take 1 tablet by mouth at bedtime.   nebivolol  2.5 MG tablet Commonly known as: BYSTOLIC  Take 1 tablet (2.5 mg total) by mouth daily.   nitroGLYCERIN  0.4 MG SL tablet Commonly known as: NITROSTAT  Place 1 tablet (0.4 mg total) under the tongue every 5 (five) minutes as needed for chest pain.   pantoprazole  40  MG tablet Commonly known as: PROTONIX  Take 1 tablet (40 mg total) by mouth 2 (two) times daily.   predniSONE  10 MG tablet Commonly known as: DELTASONE  Take 4 tablets ( total 40 mg) by mouth for 2 days; take 3 tablets ( total 30 mg) by mouth for 2 days; take 2 tablets ( total 20 mg) by mouth for 1 day; take 1 tablet ( total 10 mg) by mouth for 1 day.   pregabalin  100 MG capsule Commonly known as: LYRICA  TAKE 1 CAPSULE (100 MG TOTAL) BY MOUTH THREE TIMES DAILY.   terbinafine  250 MG tablet Commonly known as: LAMISIL  Take 1 tablet (250 mg total) by mouth daily. 12 weeks of treatment   topiramate  100 MG tablet Commonly known as: TOPAMAX  Take 1 tablet (100 mg total) by mouth 2 (two) times daily.    Review of systems negative except as noted in HPI / PMHx or noted below:  Review of Systems  Constitutional: Negative.   HENT: Negative.    Eyes: Negative.   Respiratory: Negative.    Cardiovascular: Negative.   Gastrointestinal: Negative.   Genitourinary: Negative.   Musculoskeletal: Negative.   Skin: Negative.   Neurological: Negative.   Endo/Heme/Allergies: Negative.   Psychiatric/Behavioral: Negative.      Family History   Problem Relation Age of Onset   Hypertension Mother    Thyroid  disease Mother    Osteoporosis Mother    Syncope episode Mother    Arthritis Mother    Depression Mother    Heart disease Mother    Hypertension Father    Non-Hodgkin's lymphoma Father 29   Alcohol abuse Father    Cancer Father    Hypertension Brother    Hypertension Brother    Asthma Maternal Grandmother    Cancer Maternal Grandfather    Asthma Paternal Grandmother    Asthma Daughter    Arthritis Other        parents   Prostate cancer Neg Hx    Colon polyps Neg Hx    Thyroid  cancer Neg Hx     Social History   Socioeconomic History   Marital status: Married    Spouse name: Not on file   Number of children: 6   Years of education: Not on file   Highest education level: Some college, no degree  Occupational History   Not on file  Tobacco Use   Smoking status: Never   Smokeless tobacco: Never  Vaping Use   Vaping status: Never Used  Substance and Sexual Activity   Alcohol use: Yes    Alcohol/week: 14.0 standard drinks of alcohol    Types: 14 Cans of beer per week    Comment: 2-3 a day    Drug use: No   Sexual activity: Not Currently  Other Topics Concern   Not on file  Social History Narrative   Lives at home with family. Independent at baseline.   He is on disability   He has 7 children. From 57 yrs old to 45 year old.    Social Drivers of Corporate Investment Banker Strain: Low Risk  (04/09/2024)   Overall Financial Resource Strain (CARDIA)    Difficulty of Paying Living Expenses: Not very hard  Recent Concern: Financial Resource Strain - Medium Risk (03/23/2024)   Overall Financial Resource Strain (CARDIA)    Difficulty of Paying Living Expenses: Somewhat hard  Food Insecurity: No Food Insecurity (04/09/2024)   Hunger Vital Sign    Worried About Running Out of Food  in the Last Year: Never true    Ran Out of Food in the Last Year: Never true  Recent Concern: Food Insecurity - Food  Insecurity Present (01/10/2024)   Hunger Vital Sign    Worried About Running Out of Food in the Last Year: Often true    Ran Out of Food in the Last Year: Sometimes true  Transportation Needs: No Transportation Needs (04/09/2024)   PRAPARE - Administrator, Civil Service (Medical): No    Lack of Transportation (Non-Medical): No  Recent Concern: Transportation Needs - Unmet Transportation Needs (01/10/2024)   PRAPARE - Transportation    Lack of Transportation (Medical): Yes    Lack of Transportation (Non-Medical): Yes  Physical Activity: Inactive (04/09/2024)   Exercise Vital Sign    Days of Exercise per Week: 0 days    Minutes of Exercise per Session: Not on file  Stress: No Stress Concern Present (04/09/2024)   [employer] of Occupational Health - Occupational Stress Questionnaire    Feeling of Stress: Only a little  Recent Concern: Stress - Stress Concern Present (01/10/2024)   [employer] of Occupational Health - Occupational Stress Questionnaire    Feeling of Stress: To some extent  Social Connections: Moderately Isolated (04/09/2024)   Social Connection and Isolation Panel    Frequency of Communication with Friends and Family: Three times a week    Frequency of Social Gatherings with Friends and Family: Once a week    Attends Religious Services: Never    Database Administrator or Organizations: No    Attends Engineer, Structural: Not on file    Marital Status: Married  Catering Manager Violence: Not At Risk (03/23/2024)   Humiliation, Afraid, Rape, and Kick questionnaire    Fear of Current or Ex-Partner: No    Emotionally Abused: No    Physically Abused: No    Sexually Abused: No    Environmental and Social history  Lives in a house with a dry environment, a cat and dog located inside the household, no carpet in the bedroom, no plastic on the bed, no plastic on the pillow, and no smoking ongoing with inside the household.  Objective:    Vitals:   05/26/24 0946  BP: 128/80  Pulse: (!) 56  Resp: 18  Temp: 98.2 F (36.8 C)  SpO2: 98%   Height: 5' 5.25 (165.7 cm) Weight: 210 lb 8 oz (95.5 kg)  Physical Exam Constitutional:      Appearance: He is not diaphoretic.  HENT:     Head: Normocephalic.     Right Ear: Tympanic membrane, ear canal and external ear normal.     Left Ear: Tympanic membrane, ear canal and external ear normal.     Nose: Nose normal. No mucosal edema or rhinorrhea.     Mouth/Throat:     Pharynx: Uvula midline. No oropharyngeal exudate.  Eyes:     Conjunctiva/sclera: Conjunctivae normal.  Neck:     Thyroid : No thyromegaly.     Trachea: Trachea normal. No tracheal tenderness or tracheal deviation.  Cardiovascular:     Rate and Rhythm: Normal rate and regular rhythm.     Heart sounds: Normal heart sounds, S1 normal and S2 normal. No murmur heard. Pulmonary:     Effort: No respiratory distress.     Breath sounds: Normal breath sounds. No stridor. No wheezing or rales.  Lymphadenopathy:     Head:     Right side of head: No tonsillar adenopathy.  Left side of head: No tonsillar adenopathy.     Cervical: No cervical adenopathy.  Skin:    Findings: No erythema or rash.     Nails: There is no clubbing.  Neurological:     Mental Status: He is alert.     Diagnostics: Allergy skin tests were not performed.   Spirometry was performed and demonstrated an FEV1 of 1.01 @ 34 % of predicted. FEV1/FVC = 0.47.  He had a less than optimal effort on the spirometric maneuver.  Results of a MRI head performed 21 April 2024 identified the following:  SINUSES: Minor mucosal thickening.  Results of blood tests obtained 29 April 2023 identifies WBC 8.0, absolute eosinophil 200, absolute lymphocyte 3000, hemoglobin 14.9, platelet 251   Assessment and Plan:    1. Perennial allergic rhinitis   2. LPRD (laryngopharyngeal reflux disease)   3. Asthma, mild intermittent, well-controlled    1.  Return to clinic for skin testing (no antihistamines)  2. Treat and prevent inflammation of airway:   A. Fluticasone  - 1 spray each nostril 2 times per day  B. Azelastine  - 1 spray each nostril 2 times per day  C. Montelukast 10 mg - 1 tablet 2 times per day  3. Treat and prevent reflux induced inflammation of airway:   A.  Replace all throat clearing with swallowing/drinking maneuver  B.  Consolidate caffeine consumption  C.  Continue pantoprazole  40 mg twice a day  D.  Start famotidine 40 mg tablet in the evening  4. If needed:   A. Nasal saline  B. Albuterol  + fluticasone  44 - 2 inhalations TOGETHER every 4-6 hours (empty lungs)  C. Cetirizine 10 mg - 1 tablet 1-2 times per day  5. Immunotherapy???  6. Influenza = Tamiflu . Covid = Paxlovid   Manjot appears to have dual insults to his respiratory tract epithelium including atopic disease and reflux disease and we will address both these issues during today's visit and have him return to this clinic for skin testing to define his aeroallergen hypersensitivity profile in anticipation of possibly starting him on a course of immunotherapy.  I will see him back in this clinic for skin testing.  Wesley DOROTHA Denis, MD Allergy / Immunology [facility] [place] 

## 2024-05-27 ENCOUNTER — Encounter: Payer: Self-pay | Admitting: Allergy and Immunology

## 2024-05-29 NOTE — Addendum Note (Signed)
 Addended by: DANIEL NIVIA DEL on: 05/29/2024 04:51 PM   Modules accepted: Orders

## 2024-06-05 ENCOUNTER — Other Ambulatory Visit (HOSPITAL_COMMUNITY): Payer: Self-pay

## 2024-06-10 ENCOUNTER — Telehealth: Payer: Self-pay | Admitting: Family

## 2024-06-10 NOTE — Telephone Encounter (Signed)
 Call patient I received letter from [facility] pharmacy in regards to medication being sent to [facility].  I am not sure which medication or of all medications need to be sent to [facility].  Please update pharmacy list and pend medications after speaking to patient

## 2024-06-10 NOTE — Telephone Encounter (Signed)
 Spoke to pt he would like all his medications to be sent to [facility], changed in chart already

## 2024-06-15 ENCOUNTER — Encounter: Payer: Self-pay | Admitting: Family

## 2024-06-15 DIAGNOSIS — M5412 Radiculopathy, cervical region: Secondary | ICD-10-CM

## 2024-06-15 NOTE — Telephone Encounter (Signed)
 Spoke to pt he does have enough medication to last until Rollene returns on the 29th, will have her refill the morning of the 29th

## 2024-06-17 ENCOUNTER — Encounter: Payer: Self-pay | Admitting: Family

## 2024-06-17 MED ORDER — PREGABALIN 100 MG PO CAPS: ORAL_CAPSULE | ORAL | 2 refills | Status: AC

## 2024-06-23 ENCOUNTER — Ambulatory Visit: Admitting: Allergy and Immunology

## 2024-06-24 ENCOUNTER — Other Ambulatory Visit: Payer: Self-pay | Admitting: *Deleted

## 2024-06-24 DIAGNOSIS — K219 Gastro-esophageal reflux disease without esophagitis: Secondary | ICD-10-CM

## 2024-06-24 DIAGNOSIS — J3089 Other allergic rhinitis: Secondary | ICD-10-CM

## 2024-06-24 MED ORDER — FAMOTIDINE 40 MG PO TABS: 40.0000 mg | ORAL_TABLET | Freq: Every evening | ORAL | 1 refills | Status: DC

## 2024-06-24 MED ORDER — FLUTICASONE PROPIONATE 50 MCG/ACT NA SUSP: 2.0000 | Freq: Every day | NASAL | 1 refills | Status: AC

## 2024-06-24 MED ORDER — AZELASTINE HCL 0.1 % NA SOLN: 1.0000 | Freq: Two times a day (BID) | NASAL | 1 refills | Status: AC

## 2024-07-01 ENCOUNTER — Encounter: Payer: Self-pay | Admitting: Family

## 2024-07-02 ENCOUNTER — Other Ambulatory Visit: Payer: Self-pay | Admitting: *Deleted

## 2024-07-02 DIAGNOSIS — K219 Gastro-esophageal reflux disease without esophagitis: Secondary | ICD-10-CM

## 2024-07-02 MED ORDER — FAMOTIDINE 40 MG PO TABS: 40.0000 mg | ORAL_TABLET | Freq: Every evening | ORAL | 1 refills | Status: AC

## 2024-07-03 ENCOUNTER — Other Ambulatory Visit: Payer: Self-pay | Admitting: Medical Genetics

## 2024-07-03 ENCOUNTER — Other Ambulatory Visit: Payer: Self-pay | Admitting: Family

## 2024-07-03 DIAGNOSIS — I10 Essential (primary) hypertension: Secondary | ICD-10-CM

## 2024-07-03 MED ORDER — NEBIVOLOL HCL 2.5 MG PO TABS: 2.5000 mg | ORAL_TABLET | Freq: Every day | ORAL | 3 refills | Status: AC

## 2024-07-10 ENCOUNTER — Encounter: Payer: Self-pay | Admitting: Family

## 2024-07-10 ENCOUNTER — Ambulatory Visit: Admitting: Family

## 2024-07-10 VITALS — BP 126/76 | HR 70 | Temp 98.1°F | Ht 67.0 in | Wt 216.7 lb

## 2024-07-10 DIAGNOSIS — G43909 Migraine, unspecified, not intractable, without status migrainosus: Secondary | ICD-10-CM | POA: Insufficient documentation

## 2024-07-10 DIAGNOSIS — G43809 Other migraine, not intractable, without status migrainosus: Secondary | ICD-10-CM | POA: Diagnosis not present

## 2024-07-10 DIAGNOSIS — B351 Tinea unguium: Secondary | ICD-10-CM | POA: Diagnosis not present

## 2024-07-10 DIAGNOSIS — M5412 Radiculopathy, cervical region: Secondary | ICD-10-CM | POA: Diagnosis not present

## 2024-07-10 DIAGNOSIS — I10 Essential (primary) hypertension: Secondary | ICD-10-CM | POA: Diagnosis not present

## 2024-07-10 MED ORDER — TERBINAFINE HCL 250 MG PO TABS: 250.0000 mg | ORAL_TABLET | Freq: Every day | ORAL | 0 refills | Status: AC

## 2024-07-10 NOTE — Progress Notes (Unsigned)
 "  Assessment & Plan:  Other migraine without status migrainosus, not intractable     Return precautions given.   Risks, benefits, and alternatives of the medications and treatment plan prescribed today were discussed, and patient expressed understanding.   Education regarding symptom management and diagnosis given to patient on AVS either electronically or printed.  No follow-ups on file.  Rollene Northern, FNP  Subjective:    Patient ID: Wesley Bridges, male    DOB: Dec 15, 1970, 54 y.o.   MRN: [id number]  CC: Wesley Bridges is a 54 y.o. male who presents today for follow up.   HPI: HPI He is compliant with Lyrica  100 mg TID Consult neurology 05/27/2024 for migraine Continue Topamax  100 mg twice daily. - Start Emgality monthly injectable for headache prevention -Could consider restarting nortriptyline -Continue to follow with neurosurgery for neck pain - Prescribed Maxalt as a rescue medication for severe headache episodes, with instructions to take as soon as migraine symptoms begin and repeat in two hours if needed, not exceeding two doses in 24 hours.  Echocardiogram 11/2016 LVEF 50-55%  Left heart cath 12/2016  Conclusions: No angiographically significant coronary artery disease. Upper normal to mildly elevated left ventricular filling pressure.   Recommendations: Primary prevention. Consider evaluation for noncardiac causes of chest pain. Outpatient follow-up.  Dr End 03/05/2018  Chronic atypical chest pain Pain remains very atypical and unlikely to be related to significant coronary insufficiency.  Catheterization last year showed no significant CAD.  We have agreed to discontinue aspirin , as there is no indication for this for primary prevention and could potentially be exacerbating chest pain if it is due to a GI cause.  No further cardiac testing is recommended at this time.   Syncope No recurrent episodes since her last visit.  Vague dizzy feeling  persists.  I have encouraged the patient to continue to stay well-hydrated.  Event monitor last year was notable for occasional PACs and PVCs as well as a single brief atrial run.  No significant arrhythmia noted.  No further work-up at this time.   Hypertension Blood pressure well controlled today.  Recent secondary hypertension work-up was notable only for mildly elevated 24-hour dopamine level (less than 2 times ULN).  We have discussed utility of referral to endocrinology for further work-up but have agreed to defer at this given improving blood pressure control and only borderline abnormal 24-hour catecholamine testing.   Headache and paresthesias Present for the last few weeks to months.  Given the symptoms as well as dizziness and syncope in the past, I will refer Mr. Moya-Soborio to neurology for further evaluation.   Hyperlipidemia Patient remains on atorvastatin  40 mg daily.  Given lack of coronary disease, this could potentially be de-escalated.  No lipid panel in over a year.  I will defer testing and statin management to Dr. Lollie, whom the patient is seeing next month.    Allergies: Bee pollen, Pollen extract, and Short ragweed pollen ext Medications Ordered Prior to Encounter[1]  Review of Systems  Constitutional:  Negative for chills and fever.  Respiratory:  Negative for cough.   Cardiovascular:  Negative for chest pain and palpitations.  Gastrointestinal:  Negative for nausea and vomiting.      Objective:    BP 126/76   Pulse 70   Temp 98.1 F (36.7 C) (Oral)   Ht 5' 7 (1.702 m)   Wt 216 lb 11.2 oz (98.3 kg)   SpO2 96%   BMI 33.94 kg/m  BP  Readings from Last 3 Encounters:  07/10/24 126/76  05/26/24 128/80  04/09/24 130/74   Wt Readings from Last 3 Encounters:  07/10/24 216 lb 11.2 oz (98.3 kg)  05/26/24 210 lb 8 oz (95.5 kg)  04/09/24 211 lb 3.2 oz (95.8 kg)    Physical Exam Vitals reviewed.  Constitutional:      Appearance: He is well-developed.   Cardiovascular:     Rate and Rhythm: Regular rhythm.     Heart sounds: Normal heart sounds.  Pulmonary:     Effort: Pulmonary effort is normal. No respiratory distress.     Breath sounds: Normal breath sounds. No wheezing, rhonchi or rales.  Skin:    General: Skin is warm and dry.     Comments: Yellow to brown discoloration diffusely over great toe nails.  Neurological:     Mental Status: He is alert.  Psychiatric:        Speech: Speech normal.        Behavior: Behavior normal.            [1]  Current Outpatient Medications on File Prior to Visit  Medication Sig Dispense Refill   albuterol  (VENTOLIN  HFA) 108 (90 Base) MCG/ACT inhaler Inhale 1-2 puffs into the lungs every 6 (six) hours as needed. 8 g 0   amLODipine  (NORVASC ) 5 MG tablet Take 1 tablet (5 mg total) by mouth daily. 90 tablet 3   aspirin  EC 81 MG tablet Take 81 mg by mouth daily.     atorvastatin  (LIPITOR) 80 MG tablet Take 1 tablet (80 mg total) by mouth daily. 90 tablet 3   azelastine  (ASTELIN ) 0.1 % nasal spray Place 1 spray into both nostrils 2 (two) times daily. 120 mL 1   famotidine  (PEPCID ) 40 MG tablet Take 1 tablet (40 mg total) by mouth every evening. 90 tablet 1   fluticasone  (FLONASE ) 50 MCG/ACT nasal spray Place 2 sprays into both nostrils daily. 48 g 1   Fluticasone  Furoate (ARNUITY ELLIPTA) 100 MCG/ACT AEPB Inhale 2 puffs into the lungs every 6 (six) hours as needed. 30 each 1   levocetirizine (XYZAL  ALLERGY 24HR) 5 MG tablet Take 1 tablet (5 mg total) by mouth every evening. 90 tablet 3   meloxicam  (MOBIC ) 15 MG tablet TAKE 1 TABLET DAILY AS NEEDED FOR PAIN 90 tablet 1   montelukast  (SINGULAIR ) 10 MG tablet Take 1 tablet (10 mg total) by mouth 2 (two) times daily. 60 tablet 2   Multiple Vitamin (MULTIVITAMIN WITH MINERALS) TABS tablet Take 1 tablet by mouth at bedtime.     nebivolol  (BYSTOLIC ) 2.5 MG tablet Take 1 tablet (2.5 mg total) by mouth daily. 90 tablet 3   nitroGLYCERIN  (NITROSTAT ) 0.4 MG SL  tablet Place 1 tablet (0.4 mg total) under the tongue every 5 (five) minutes as needed for chest pain. 25 tablet 0   pantoprazole  (PROTONIX ) 40 MG tablet Take 1 tablet (40 mg total) by mouth 2 (two) times daily. 180 tablet 3   pregabalin  (LYRICA ) 100 MG capsule TAKE 1 CAPSULE (100 MG TOTAL) BY MOUTH THREE TIMES DAILY. 90 capsule 2   terbinafine  (LAMISIL ) 250 MG tablet Take 1 tablet (250 mg total) by mouth daily. 12 weeks of treatment 90 tablet 0   topiramate  (TOPAMAX ) 100 MG tablet Take 1 tablet (100 mg total) by mouth 2 (two) times daily. 180 tablet 3   No current facility-administered medications on file prior to visit.   "

## 2024-07-10 NOTE — Patient Instructions (Addendum)
 Repeat 3 month antifungal medication  Obtain from alcohol  Repeat labs in 6 weeks.  Referral back to cardiology , Dr End  Let us  know if you dont hear back within 2 weeks in regards to an appointment being scheduled.   So that you are aware, if you are [facility] MyChart user , please pay attention to your MyChart messages as you may receive a MyChart message with a phone number to call and schedule this test/appointment own your own from our referral coordinator. This is a new process so I do not want you to miss this message.  If you are not a MyChart user, you will receive a phone call.

## 2024-07-13 NOTE — Assessment & Plan Note (Signed)
 Chronic, stable. Continue Lyrica  100 mg TID.  Counseled on risk of sedation.

## 2024-07-13 NOTE — Assessment & Plan Note (Signed)
" °  Persistent onychomycosis shows partial improvement. Liver enzymes are normal, but caution is advised due to potential antifungal hepatotoxicity. Alcohol intake was discussed and advised to abstain. Start lamisil  three months and order liver enzyme tests in six weeks.  "

## 2024-07-13 NOTE — Assessment & Plan Note (Signed)
" °  Hypertension is managed with Bystolic  2.5mg  QD, is likely causing asymptomatic bradycardia which he has noticed on his watch.Fortunately, there are no recent episodes of syncope or dizziness. Referral back to cardiology for continued surveillance.   "

## 2024-07-15 ENCOUNTER — Encounter: Payer: Self-pay | Admitting: Internal Medicine

## 2024-07-15 ENCOUNTER — Ambulatory Visit: Admitting: Internal Medicine

## 2024-07-15 ENCOUNTER — Telehealth: Payer: Self-pay

## 2024-07-15 VITALS — BP 120/78 | HR 56 | Temp 99.8°F | Ht 67.0 in | Wt 219.0 lb

## 2024-07-15 DIAGNOSIS — J45909 Unspecified asthma, uncomplicated: Secondary | ICD-10-CM | POA: Diagnosis not present

## 2024-07-15 DIAGNOSIS — E669 Obesity, unspecified: Secondary | ICD-10-CM

## 2024-07-15 DIAGNOSIS — I10 Essential (primary) hypertension: Secondary | ICD-10-CM

## 2024-07-15 DIAGNOSIS — Z6834 Body mass index (BMI) 34.0-34.9, adult: Secondary | ICD-10-CM

## 2024-07-15 DIAGNOSIS — E785 Hyperlipidemia, unspecified: Secondary | ICD-10-CM

## 2024-07-15 DIAGNOSIS — K219 Gastro-esophageal reflux disease without esophagitis: Secondary | ICD-10-CM

## 2024-07-15 DIAGNOSIS — J309 Allergic rhinitis, unspecified: Secondary | ICD-10-CM

## 2024-07-15 DIAGNOSIS — G4733 Obstructive sleep apnea (adult) (pediatric): Secondary | ICD-10-CM

## 2024-07-15 NOTE — Progress Notes (Signed)
 "  Name: Wesley Bridges MRN: [id number] DOB: Nov 19, 1970    PREVIOUS HISTORY Patient with a previous diagnosis of sleep apnea AHI of 25 With severe hypoxia Patient started CPAP therapy however had significant issues with her dry mouth dry lips Patient intolerant of CPAP and discontinued his therapy 5 years ago  CHIEF COMPLAINT:  Follow up Assessment of OSA HST 02/2024 AHI 18-23   HISTORY OF PRESENT ILLNESS:  Discussed sleep data and reviewed with patient.  Encouraged proper weight management.  Discussed sleep hygiene Patient uses and benefits from therapy Using CPAP nightly and with naps Settings are comfortable and is sleeping well. Auto CPAP 4-16 AHI reduced to 1.4  No exacerbation at this time No evidence of heart failure at this time No evidence or signs of infection at this time No respiratory distress No fevers, chills, nausea, vomiting, diarrhea No evidence of lower extremity edema No evidence hemoptysis  Patient still complains of fogginess excessive daytime sleepiness fatigue excessive yawning during the day even though he is very compliant and his AHI reduced to 1.4 We will try a new mask F30 I air touch mask to see if that will help Patient states his humidity setting is on  PAST MEDICAL HISTORY :   has a past medical history of Allergy (2016), Anginal pain, Anxiety, Arthritis, Asthma, Broken jaw (HCC), Chronic headaches, Complication of anesthesia, Depression (January 2018), Dyspnea, Dysrhythmia, Family history of adverse reaction to anesthesia, GERD (gastroesophageal reflux disease), Hyperlipidemia, Hypertension, Osteoporosis, Sleep apnea, and Subclinical hyperthyroidism.  has a past surgical history that includes Ankle fracture surgery; Ankle Fusion; Femur fracture surgery; Mandible fracture surgery; LEFT HEART CATH AND CORONARY ANGIOGRAPHY (N/A, 01/08/2017); Fracture surgery; Robot assisted inguinal hernia repair (Bilateral, 04/17/2018); I & D extremity  (Right, 07/30/2018); Knee arthroscopy with medial menisectomy (Left, 05/13/2019); Cardiac catheterization; Hernia repair (Bilateral); Elbow arthroscopy (Right, 09/01/2019); Colonoscopy with propofol  (N/A, 12/21/2021); Esophagogastroduodenoscopy (egd) with propofol  (N/A, 12/21/2021); and right elbow surgery (07/25/2020). Prior to Admission medications   Medication Sig Start Date End Date Taking? Authorizing Provider  albuterol  (VENTOLIN  HFA) 108 (90 Base) MCG/ACT inhaler Inhale 1-2 puffs into the lungs every 6 (six) hours as needed. 09/17/23   Vivienne Delon HERO, PA-C  amLODipine  (NORVASC ) 5 MG tablet Take 1 tablet (5 mg total) by mouth daily. 06/24/23   Maribeth Camellia MATSU, MD  aspirin  EC 81 MG tablet Take 81 mg by mouth daily.    [provider]  atorvastatin  (LIPITOR) 80 MG tablet Take 1 tablet (80 mg total) by mouth daily. 03/09/24   Dineen Rollene MATSU, FNP  azelastine  (ASTELIN ) 0.1 % nasal spray USE 2 SPRAYS IN EACH NOSTRIL TWICE DAILY AS DIRECTED 06/20/23   Maribeth Camellia MATSU, MD  fluticasone  (FLONASE ) 50 MCG/ACT nasal spray USE 2 SPRAYS IN EACH NOSTRIL DAILY 07/09/23   Maribeth Camellia MATSU, MD  levocetirizine (XYZAL  ALLERGY 24HR) 5 MG tablet Take 1 tablet (5 mg total) by mouth every evening. 02/03/24   Dineen Rollene MATSU, FNP  meloxicam  (MOBIC ) 15 MG tablet TAKE 1 TABLET DAILY AS NEEDED FOR PAIN 04/10/23   Maribeth Camellia MATSU, MD  Multiple Vitamin (MULTIVITAMIN WITH MINERALS) TABS tablet Take 1 tablet by mouth at bedtime.    [provider]  nebivolol  (BYSTOLIC ) 2.5 MG tablet Take 1 tablet (2.5 mg total) by mouth daily. 06/24/23   Maribeth Camellia MATSU, MD  nitroGLYCERIN  (NITROSTAT ) 0.4 MG SL tablet Place 1 tablet (0.4 mg total) under the tongue every 5 (five) minutes as needed for chest pain. 12/05/22  Webb, Padonda B, FNP  pantoprazole  (PROTONIX ) 40 MG tablet Take 1 tablet (40 mg total) by mouth 2 (two) times daily. 02/03/24   Dineen Rollene MATSU, FNP  pregabalin  (LYRICA ) 100 MG capsule  TAKE 1 CAPSULE (100 MG TOTAL) BY MOUTH THREE TIMES DAILY. 03/09/24   Dineen Rollene MATSU, FNP  terbinafine  (LAMISIL ) 250 MG tablet Take 1 tablet (250 mg total) by mouth daily. 12 weeks of treatment 01/14/24   Dineen Rollene MATSU, FNP  topiramate  (TOPAMAX ) 100 MG tablet Take 1 tablet (100 mg total) by mouth 2 (two) times daily. 06/24/23   Maribeth Camellia MATSU, MD   Allergies  Allergen Reactions   Bee Pollen Cough and Other (See Comments)    Cough runny eyes   Pollen Extract Cough and Other (See Comments)   Short Ragweed Pollen Ext Other (See Comments) and Cough    FAMILY HISTORY:  family history includes Alcohol abuse in his father; Arthritis in his mother and another family member; Asthma in his daughter, maternal grandmother, and paternal grandmother; Cancer in his father and maternal grandfather; Depression in his mother; Heart disease in his mother; Hypertension in his brother, brother, father, and mother; Non-Hodgkin's lymphoma (age of onset: 66) in his father; Osteoporosis in his mother; Syncope episode in his mother; Thyroid  disease in his mother. SOCIAL HISTORY:  reports that he has never smoked. He has never used smokeless tobacco. He reports current alcohol use of about 14.0 standard drinks of alcohol per week. He reports that he does not use drugs.   BP 120/78   Pulse (!) 56   Temp 99.8 F (37.7 C)   Ht 5' 7 (1.702 m)   Wt 219 lb (99.3 kg)   SpO2 98%   BMI 34.30 kg/m     Physical Examination:  General Appearance: No distress  EYES EOM intact.   NECK Supple, No JVD Pulmonary: normal breath sounds, No wheezing.  CardiovascularNormal S1,S2.  No m/r/g.   Ext pulses intact, cap refill intact  ALL OTHER ROS ARE NEGATIVE     ASSESSMENT AND PLAN SYNOPSIS  54 year old pleasant Hispanic male seen today for signs and symptoms of excessive daytime sleepiness with underlying diagnosis of obstructive sleep apnea in the setting of obesity and deconditioned state AHI  18-23   Assessment of OSA Previous AHI 18-23 Continue CPAP as prescribed  Excellent compliance report Reviewed compliance report in detail with patient Patient definitely benefits the use of CPAP therapy as prescribed Using CPAP nightly and with naps Pressure setting is comfortable and is sleeping well. CPAP prescription 4-16 AHI reduced to 1.4  No evidence of acute heart failure at this time No respiratory distress No fevers, chills, nausea, vomiting, diarrhea No evidence hemoptysis  Patient Instructions Continue to use CPAP every night, minimum of 4-6 hours a night.  Change equipment every 30 days or as directed by DME.  Wash your tubing with warm soap and water  daily, hang to dry.  Wash humidifier portion weekly. Use bottled, distilled water  and change daily  Risk of untreated sleep apnea including cardiac arrhthymias, stroke, DM, pulm HTN.   Plan to switch mask F30 AirTouch mask Recommend follow-up assessment with Dr. Jess   Obesity -recommend significant weight loss -recommend changing diet  Deconditioned state -Recommend increased daily activity and exercise   Underlying reactive airways disease Uses Arnuity as needed Has not used it in several months Avoid Allergens and Irritants Avoid secondhand smoke Avoid SICK contacts Recommend  Masking  when appropriate Recommend Keep up-to-date with vaccinations  MEDICATION ADJUSTMENTS/LABS AND TESTS ORDERED: Try new F30 AirTouch mask Referral to Dr. Jess Continue inhalers as needed   CURRENT MEDICATIONS REVIEWED AT LENGTH WITH PATIENT TODAY   Patient  satisfied with Plan of action and management. All questions answered   Follow up 6 months   I spent a total of 45 minutes dedicated to the care of this patient on the date of this encounter to include pre-visit review of records, face-to-face time with the patient discussing conditions above, post visit ordering of testing, clinical documentation with the  electronic health record, making appropriate referrals as documented, and communicating necessary information to the patient's healthcare team.    The Patient requires high complexity decision making for assessment and support, frequent evaluation and titration of therapies, application of advanced monitoring technologies and extensive interpretation of multiple databases.  Patient satisfied with Plan of action and management. All questions answered    Nickolas Alm Cellar, M.D.  [facility]  Medical Director [facility] [place]    "

## 2024-07-15 NOTE — Telephone Encounter (Signed)
 Copied from CRM #[id number]. Topic: Clinical - Medication Refill >> Jul 15, 2024  1:33 PM [name] wrote: Medication: amLODipine  (NORVASC ) 5 MG tablet  pantoprazole  (PROTONIX ) 40 MG tablet levocetirizine (XYZAL  ALLERGY 24HR) 5 MG atorvastatin  (LIPITOR) 80 MG tablet  Has the patient contacted their pharmacy? Yes (Agent: If no, request that the patient contact the pharmacy for the refill. If patient does not wish to contact the pharmacy document the reason why and proceed with request.) (Agent: If yes, when and what did the pharmacy advise?)  This is the patient's preferred pharmacy:  [facility] - [place], [place] - [address] [place] [address] Phone: [phone] Fax: [phone]  Is this the correct pharmacy for this prescription? Yes If no, delete pharmacy and type the correct one.   Has the prescription been filled recently? No  Is the patient out of the medication? No  Has the patient been seen for an appointment in the last year OR does the patient have an upcoming appointment? Yes  Can we respond through MyChart? Yes  Agent: Please be advised that Rx refills may take up to 3 business days. We ask that you follow-up with your pharmacy.

## 2024-07-15 NOTE — Patient Instructions (Signed)

## 2024-07-16 NOTE — Addendum Note (Signed)
 Addended by: ISAIAH LENIS on: 07/16/2024 12:33 PM   Modules accepted: Level of Service

## 2024-07-17 MED ORDER — AMLODIPINE BESYLATE 5 MG PO TABS: 5.0000 mg | ORAL_TABLET | Freq: Every day | ORAL | 3 refills | Status: AC

## 2024-07-17 MED ORDER — ATORVASTATIN CALCIUM 80 MG PO TABS: 80.0000 mg | ORAL_TABLET | Freq: Every day | ORAL | 3 refills | Status: AC

## 2024-07-17 MED ORDER — LEVOCETIRIZINE DIHYDROCHLORIDE 5 MG PO TABS: 5.0000 mg | ORAL_TABLET | Freq: Every evening | ORAL | 3 refills | Status: AC

## 2024-07-17 MED ORDER — ALBUTEROL SULFATE HFA 108 (90 BASE) MCG/ACT IN AERS: 1.0000 | INHALATION_SPRAY | Freq: Four times a day (QID) | RESPIRATORY_TRACT | 0 refills | Status: AC | PRN

## 2024-07-17 NOTE — Telephone Encounter (Signed)
 Pt has been notified that rx's have been sent in to [facility] pharmacy

## 2024-07-17 NOTE — Addendum Note (Signed)
 Addended by: Christianna Belmonte on: 07/17/2024 11:12 AM   Modules accepted: Orders

## 2024-07-21 ENCOUNTER — Ambulatory Visit: Admitting: Allergy and Immunology

## 2024-07-22 ENCOUNTER — Ambulatory Visit: Admitting: Internal Medicine

## 2024-07-23 ENCOUNTER — Encounter: Payer: Self-pay | Admitting: Sleep Medicine

## 2024-07-23 ENCOUNTER — Ambulatory Visit: Admitting: Sleep Medicine

## 2024-07-23 ENCOUNTER — Other Ambulatory Visit: Payer: Self-pay | Admitting: Sleep Medicine

## 2024-07-23 VITALS — BP 112/70 | HR 66 | Temp 98.1°F | Ht 67.0 in | Wt 215.0 lb

## 2024-07-23 DIAGNOSIS — G4700 Insomnia, unspecified: Secondary | ICD-10-CM | POA: Diagnosis not present

## 2024-07-23 DIAGNOSIS — Z6833 Body mass index (BMI) 33.0-33.9, adult: Secondary | ICD-10-CM

## 2024-07-23 DIAGNOSIS — F5104 Psychophysiologic insomnia: Secondary | ICD-10-CM

## 2024-07-23 DIAGNOSIS — E669 Obesity, unspecified: Secondary | ICD-10-CM | POA: Diagnosis not present

## 2024-07-23 DIAGNOSIS — I10 Essential (primary) hypertension: Secondary | ICD-10-CM

## 2024-07-23 DIAGNOSIS — G4733 Obstructive sleep apnea (adult) (pediatric): Secondary | ICD-10-CM | POA: Diagnosis not present

## 2024-07-23 MED ORDER — TRAZODONE HCL 50 MG PO TABS: 50.0000 mg | ORAL_TABLET | Freq: Every day | ORAL | 3 refills | Status: AC

## 2024-07-23 MED ORDER — ZEPBOUND 2.5 MG/0.5ML ~~LOC~~ SOAJ: 2.5000 mg | SUBCUTANEOUS | 1 refills | Status: DC

## 2024-07-23 NOTE — Patient Instructions (Signed)
" °  Patient Instructions Need to be using your CPAP every night, minimum of 7-8 hours a night.  Change equipment as directed. Wash your tubing with warm soap and water  weekly, hang to dry. Wash humidifier portion weekly. Use bottled, distilled water  and change daily Be aware of reduced alertness and do not drive or operate heavy machinery if experiencing this or drowsiness.  Exercise encouraged, as tolerated. Healthy weight management discussed.  Avoid or decrease alcohol consumption and medications that make you more sleepy, if possible. Notify if persistent daytime sleepiness occurs even with consistent use of PAP therapy.   Change CPAP supplies... Every month Mask cushions and/or nasal pillows CPAP machine filters Every 3 months Mask frame (not including the headgear) CPAP tubing Every 6 months Mask headgear Chin strap (if applicable) Humidifier water  tub   Be aware of reduced alertness and do not drive or operate heavy machinery if experiencing this or drowsiness.  Exercise encouraged, as tolerated. Encouraged proper weight management.  Important to get eight or more hours of sleep  Limiting the use of the computer and television before bedtime.  Decrease naps during the day, so night time sleep will become enhanced.  Limit caffeine, and sleep deprivation.  HTN, stroke, uncontrolled diabetes and heart failure are potential risk factors.  Risk of untreated sleep apnea including cardiac arrhthymias, stroke, DM, pulm HTN.           "

## 2024-07-23 NOTE — Progress Notes (Signed)
 "      Name:Wesley Bridges MRN: [id number] DOB: 1971/02/28   CHIEF COMPLAINT:  CPAP F/U   HISTORY OF PRESENT ILLNESS: Wesley Bridges is a 54 y.o. w/ a h/o OSA, HTN and obesity who presents for CPAP F/U visit. Reports using CPAP therapy every night, which is confirmed by compliance data. He is currently using the Airfit N30i nasal mask.   c/o loud snoring and excessive daytime sleepiness which has been present for several years. Reports nocturnal awakenings due to unclear reasons, however does not have difficulty falling back to sleep. Denies any significant weight changes. Denies morning headaches, RLS symptoms, dream enactment, cataplexy, hypnagogic or hypnapompic hallucinations. Reports a family history of sleep apnea. Denies drowsy driving. Drinks 1-2 sodas daily, occasional alcohol use, former smoker, denies illicit drug use.   Bedtime 8-8:30 pm Sleep onset several hours Rise time 6-6:30 am   EPWORTH SLEEP SCORE    07/15/2024    8:00 AM 03/11/2024    1:00 PM  Results of the Epworth flowsheet  Sitting and reading 3 3  Watching TV 3 3  Sitting, inactive in a public place (e.g. a theatre or a meeting) 2 3  As a passenger in a car for an hour without a break 3 3  Lying down to rest in the afternoon when circumstances permit 3 3  Sitting and talking to someone 0 0  Sitting quietly after a lunch without alcohol 2 2  In a car, while stopped for a few minutes in traffic 0 1  Total score 16 18    PAST MEDICAL HISTORY :   has a past medical history of Allergy (2016), Anginal pain, Anxiety, Arthritis, Asthma, Broken jaw (HCC), Chronic headaches, Complication of anesthesia, Depression (January 2018), Dyspnea, Dysrhythmia, Family history of adverse reaction to anesthesia, GERD (gastroesophageal reflux disease), Hyperlipidemia, Hypertension, Osteoporosis, Sleep apnea, and Subclinical hyperthyroidism.  has a past surgical history that includes Ankle fracture surgery; Ankle Fusion;  Femur fracture surgery; Mandible fracture surgery; LEFT HEART CATH AND CORONARY ANGIOGRAPHY (N/A, 01/08/2017); Fracture surgery; Robot assisted inguinal hernia repair (Bilateral, 04/17/2018); I & D extremity (Right, 07/30/2018); Knee arthroscopy with medial menisectomy (Left, 05/13/2019); Cardiac catheterization; Hernia repair (Bilateral); Elbow arthroscopy (Right, 09/01/2019); Colonoscopy with propofol  (N/A, 12/21/2021); Esophagogastroduodenoscopy (egd) with propofol  (N/A, 12/21/2021); and right elbow surgery (07/25/2020). Prior to Admission medications  Medication Sig Start Date End Date Taking? Authorizing Provider  albuterol  (VENTOLIN  HFA) 108 (90 Base) MCG/ACT inhaler Inhale 1-2 puffs into the lungs every 6 (six) hours as needed. 07/17/24  Yes Arnett, Rollene MATSU, FNP  amLODipine  (NORVASC ) 5 MG tablet Take 1 tablet (5 mg total) by mouth daily. 07/17/24  Yes Dineen Rollene MATSU, FNP  aspirin  EC 81 MG tablet Take 81 mg by mouth daily.   Yes [provider]  atorvastatin  (LIPITOR) 80 MG tablet Take 1 tablet (80 mg total) by mouth daily. 07/17/24  Yes Arnett, Rollene MATSU, FNP  azelastine  (ASTELIN ) 0.1 % nasal spray Place 1 spray into both nostrils 2 (two) times daily. 06/24/24  Yes Kozlow, Camellia PARAS, MD  famotidine  (PEPCID ) 40 MG tablet Take 1 tablet (40 mg total) by mouth every evening. 07/02/24  Yes Kozlow, Camellia PARAS, MD  fluticasone  (FLONASE ) 50 MCG/ACT nasal spray Place 2 sprays into both nostrils daily. 06/24/24  Yes Kozlow, Eric J, MD  Fluticasone  Furoate (ARNUITY ELLIPTA) 100 MCG/ACT AEPB Inhale 2 puffs into the lungs every 6 (six) hours as needed. 05/27/24  Yes Kozlow, Eric J, MD  Galcanezumab-gnlm 120 MG/ML  SOAJ Inject 120 mg into the skin. 05/27/24  Yes [provider]  levocetirizine (XYZAL  ALLERGY 24HR) 5 MG tablet Take 1 tablet (5 mg total) by mouth every evening. 07/17/24  Yes Arnett, Rollene MATSU, FNP  meloxicam  (MOBIC ) 15 MG tablet TAKE 1 TABLET DAILY AS NEEDED FOR PAIN 04/10/23  Yes  Maribeth Camellia MATSU, MD  montelukast  (SINGULAIR ) 10 MG tablet Take 1 tablet (10 mg total) by mouth 2 (two) times daily. 05/26/24  Yes Kozlow, Eric J, MD  Multiple Vitamin (MULTIVITAMIN WITH MINERALS) TABS tablet Take 1 tablet by mouth at bedtime.   Yes [provider]  nebivolol  (BYSTOLIC ) 2.5 MG tablet Take 1 tablet (2.5 mg total) by mouth daily. 07/03/24  Yes Arnett, Rollene MATSU, FNP  nitroGLYCERIN  (NITROSTAT ) 0.4 MG SL tablet Place 1 tablet (0.4 mg total) under the tongue every 5 (five) minutes as needed for chest pain. 12/05/22  Yes Webb, Padonda B, FNP  pantoprazole  (PROTONIX ) 40 MG tablet Take 1 tablet (40 mg total) by mouth 2 (two) times daily. 02/03/24  Yes Arnett, Rollene MATSU, FNP  pregabalin  (LYRICA ) 100 MG capsule TAKE 1 CAPSULE (100 MG TOTAL) BY MOUTH THREE TIMES DAILY. 06/17/24  Yes Dineen Rollene MATSU, FNP  rizatriptan (MAXALT-MLT) 10 MG disintegrating tablet Take 10 mg by mouth as needed for migraine. 05/27/24  Yes [provider]  terbinafine  (LAMISIL ) 250 MG tablet Take 1 tablet (250 mg total) by mouth daily. 12 weeks of treatment 07/10/24  Yes Dineen Rollene MATSU, FNP  topiramate  (TOPAMAX ) 100 MG tablet Take 1 tablet (100 mg total) by mouth 2 (two) times daily. 04/23/24  Yes Arnett, Rollene MATSU, FNP   Allergies[1]  FAMILY HISTORY:  family history includes Alcohol abuse in his father; Arthritis in his mother and another family member; Asthma in his daughter, maternal grandmother, and paternal grandmother; Cancer in his father and maternal grandfather; Depression in his mother; Heart disease in his mother; Hypertension in his brother, brother, father, and mother; Non-Hodgkin's lymphoma (age of onset: 65) in his father; Osteoporosis in his mother; Syncope episode in his mother; Thyroid  disease in his mother. SOCIAL HISTORY:  reports that he has never smoked. He has never used smokeless tobacco. He reports current alcohol use of about 14.0 standard drinks of alcohol per week. He  reports that he does not use drugs.   Review of Systems:  Gen:  Denies  fever, sweats, chills weight loss  HEENT: Denies blurred vision, double vision, ear pain, eye pain, hearing loss, nose bleeds, sore throat Cardiac:  No dizziness, chest pain or heaviness, chest tightness,edema, No JVD Resp:   No cough, -sputum production, -shortness of breath,-wheezing, -hemoptysis,  Gi: Denies swallowing difficulty, stomach pain, nausea or vomiting, diarrhea, constipation, bowel incontinence Gu:  Denies bladder incontinence, burning urine Ext:   Denies Joint pain, stiffness or swelling Skin: Denies  skin rash, easy bruising or bleeding or hives Endoc:  Denies polyuria, polydipsia , polyphagia or weight change Psych:   Denies depression, insomnia or hallucinations  Other:  All other systems negative  VITAL SIGNS: BP 112/70   Pulse 66   Temp 98.1 F (36.7 C)   Ht 5' 7 (1.702 m)   Wt 215 lb (97.5 kg)   SpO2 99%   BMI 33.67 kg/m    Physical Examination:   General Appearance: No distress  EYES PERRLA, EOM intact.   NECK Supple, No JVD Pulmonary: normal breath sounds, No wheezing.  CardiovascularNormal S1,S2.  No m/r/g.   Abdomen: Benign, Soft, non-tender. Skin:  warm, no rashes, no ecchymosis  Extremities: normal, no cyanosis, clubbing. Neuro:without focal findings,  speech normal  PSYCHIATRIC: Mood, affect within normal limits.   ASSESSMENT AND PLAN  OSA Patient is using and benefiting from CPAP therapy. For mask discomfort, will try patient on the Airfit X30i FFM. Discussed the consequences of untreated sleep apnea. Advised not to drive drowsy for safety of patient and others. Will follow up in 3 months.   HTN Stable, on current management. Following with PCP.   Obesity  Counseled patient on diet and lifestyle modification. Will also try patient on Zepbound  and titrate as tolerated. Denies a personal or family history of thyroid  medullary CA or pancreatitis.    Insomnia Counseled patient on stimulus control and improving sleep hygiene practices. Will also try patient on Trazodone  50 mg nightly.    Patient  satisfied with Plan of action and management. All questions answered  I spent a total of 30 minutes reviewing chart data, face-to-face evaluation with the patient, counseling and coordination of care as detailed above.    Madalee Altmann, M.D.  Sleep Medicine [facility]           [1]  Allergies Allergen Reactions   Bee Pollen Cough and Other (See Comments)    Cough runny eyes   Pollen Extract Cough and Other (See Comments)   Short Ragweed Pollen Ext Other (See Comments) and Cough   "

## 2024-07-24 ENCOUNTER — Telehealth: Payer: Self-pay

## 2024-07-24 ENCOUNTER — Other Ambulatory Visit (HOSPITAL_COMMUNITY): Payer: Self-pay

## 2024-07-24 NOTE — Telephone Encounter (Signed)
 Pharmacy Patient Advocate Encounter   Received notification from RX Request Messages that prior authorization for Zepbound  2.5mg /0.31ml is required/requested.   Insurance verification completed.   The patient is insured through [employer].   Per test claim: PA required; PA started via CoverMyMeds. KEY [id number] . Waiting for clinical questions to populate.

## 2024-07-27 ENCOUNTER — Ambulatory Visit: Admitting: Internal Medicine

## 2024-07-27 NOTE — Telephone Encounter (Signed)
 Clinical questions answered and PA submitted.

## 2024-07-28 ENCOUNTER — Other Ambulatory Visit (HOSPITAL_COMMUNITY): Payer: Self-pay

## 2024-07-29 ENCOUNTER — Other Ambulatory Visit
Admission: RE | Admit: 2024-07-29 | Discharge: 2024-07-29 | Disposition: A | Payer: Self-pay | Source: Ambulatory Visit | Attending: Medical Genetics | Admitting: Medical Genetics

## 2024-07-29 ENCOUNTER — Ambulatory Visit: Admitting: Internal Medicine

## 2024-07-29 ENCOUNTER — Encounter: Payer: Self-pay | Admitting: Internal Medicine

## 2024-07-29 VITALS — BP 120/88 | HR 52 | Ht 67.0 in | Wt 215.4 lb

## 2024-07-29 DIAGNOSIS — I10 Essential (primary) hypertension: Secondary | ICD-10-CM

## 2024-07-29 DIAGNOSIS — R0609 Other forms of dyspnea: Secondary | ICD-10-CM

## 2024-07-29 DIAGNOSIS — R0789 Other chest pain: Secondary | ICD-10-CM

## 2024-07-29 DIAGNOSIS — Z01818 Encounter for other preprocedural examination: Secondary | ICD-10-CM

## 2024-07-29 DIAGNOSIS — R002 Palpitations: Secondary | ICD-10-CM | POA: Diagnosis not present

## 2024-07-29 DIAGNOSIS — R0602 Shortness of breath: Secondary | ICD-10-CM

## 2024-07-29 DIAGNOSIS — R079 Chest pain, unspecified: Secondary | ICD-10-CM

## 2024-07-29 NOTE — Patient Instructions (Signed)
 Medication Instructions:  Your physician recommends that you continue on your current medications as directed. Please refer to the Current Medication list given to you today.    *If you need a refill on your cardiac medications before your next appointment, please call your pharmacy*  Lab Work: No labs ordered today    Testing/Procedures: Your physician has requested that you have a Lexiscan  Myocardial Perfusion Imaging Study. This will take place at [facility]. Please arrive at the [facility] and sign in at the registration desk.  Please arrive 15 minutes prior to your appointment time for registration The test will take approximately 3 to 4 hours to complete  Instructions:  1. You may take your medications the morning of the test (someone from the testing  area will call you with these specific instructions)  2. Do not eat or drink 3 hours prior to your test, except you may have unlimited water   3. Do not consume products containing caffeine (regular or decaffeinated) for 12  hours prior to your test (coffee, tea, soda, chocolate, energy products, and some  medications)  4. Please wear a 2-piece outfit (no dresses, overalls, etc)  5. Do not wear cologne, perfume, aftershave, or lotions (deodorant is allowed)  6. If you use an inhaler, bring it with you to the test  Please note: If anyone comes with you to the appointment, they will need to remain in the  main lobby due to limited space in the testing area. We also ask at that you not bring  children with you during testing. Due to room size and safety concerns, children are not  allowed in the testing rooms during exams.      PLEASE NOTIFY THE OFFICE AT LEAST 24 HOURS IN ADVANCE IF YOU ARE UNABLE TO KEEP YOUR APPOINTMENT.  [phone] AND  PLEASE NOTIFY NUCLEAR MEDICINE AT [facility] AT LEAST 24 HOURS IN ADVANCE IF YOU ARE UNABLE TO KEEP YOUR APPOINTMENT. [phone]    Follow-Up: At [facility], you and your health  needs are our priority.  As part of our continuing mission to provide you with exceptional heart care, our providers are all part of one team.  This team includes your primary Cardiologist (physician) and Advanced Practice Providers or APPs (Physician Assistants and Nurse Practitioners) who all work together to provide you with the care you need, when you need it.  Your next appointment:   6 month(s)  Provider:   You may see Lonni Hanson, MD or one of the following Advanced Practice Providers on your designated Care Team:   Lonni Meager, NP Lesley Maffucci, PA-C Bernardino Bring, PA-C Cadence Stratton, PA-C Tylene Lunch, NP Barnie Hila, NP

## 2024-07-29 NOTE — Progress Notes (Unsigned)
 " Cardiology Office Note:  .   Date:  07/30/2024  ID:  Wesley Bridges, DOB 10-Jan-1971, MRN [id number] PCP: Dineen Rollene MATSU, FNP  [facility] Providers Cardiologist:  Lonni Hanson, MD     History of Present Illness: .   Wesley Bridges is a 54 y.o. male with history of hypertension, hyperlipidemia, and GERD, who has been referred for evaluation of hypertension by [name].  He was previously followed in our practice, having last been seen in 2020.  At prior visits, he complained of chronic chest pain with catheterization in 2018 showing no significant CAD.  Echo, renal artery Doppler, and event monitor were reassuring.  Today, the patient reports that he feels about the same as when we last saw him over 5 years ago.  He notes intermittent palpitations during which it feels like his heart races, which has been present for years.  He also has a fake sensation in his chest that he describes as his soul being pulled out of him.  This happens randomly and lasts a few seconds at a time.  It is associated with shortness of breath.  He notes that his heart rate sometimes dips down to the 40s per his smart watch.  He denies lightheadedness, dizziness, and syncope since we last saw him in 2020.  The patient notes that he has been less active because he does not feel as well as he used to.  He is dealing with several orthopedic issues including his cervical spine, hips, and feet.  He anticipates several surgical interventions in the next few months.  He notes that his mobility is quite limited on account of these orthopedic problems, which makes it difficult to assess his functional capacity.  ROS: See HPI  Studies Reviewed: SABRA   EKG Interpretation Date/Time:  Wednesday July 29 2024 10:55:49 EST Ventricular Rate:  52 PR Interval:  174 QRS Duration:  86 QT Interval:  416 QTC Calculation: 386 R Axis:   1  Text Interpretation: Sinus bradycardia Borderline ECG When compared  with ECG of 01-Apr-2019 11:53, HEART RATE has decreased Otherwise no significant change Confirmed by Lavone Barrientes, Lonni [phone]) on 07/30/2024 12:15:59 PM     Risk Assessment/Calculations:             Physical Exam:   VS:  BP 120/88 (Cuff Size: Normal)   Pulse (!) 52 Comment: 58 oximeter  Ht 5' 7 (1.702 m)   Wt 215 lb 6.4 oz (97.7 kg)   SpO2 96%   BMI 33.74 kg/m    Wt Readings from Last 3 Encounters:  07/29/24 215 lb 6.4 oz (97.7 kg)  07/23/24 215 lb (97.5 kg)  07/15/24 219 lb (99.3 kg)    General:  NAD. Neck: No JVD or HJR. Lungs: Clear to auscultation bilaterally without wheezes or crackles. Heart: Bradycardic but regular without murmurs, rubs, or gallops. Abdomen: Soft, nontender, nondistended. Extremities: Trace pretibial edema.  ASSESSMENT AND PLAN: .    Palpitations, shortness of breath, and chest discomfort: [name] has a longstanding symptoms with extensive workup in 2017-2019 reassuring.  Given that he is still symptomatic with limited functional capacity and may need orthopedic/spine surgeries in the coming months, I recommended obtaining a pharmacologic myocardial perfusion stress test.  Continue aspirin , atorvastatin , amlodipine , and Nebivolol .  Hypertension: Blood pressure fairly well-controlled today the diastolic reading is borderline elevated.  Secondary hypertension workup in the past was unrevealing.  We have discussed adjusting his medications but have agreed to continue his current  regimen of amlodipine  and Nebivolol , though we will need to monitor his resting heart rate closely.  It appears that he was both on HCTZ and various ARB's in the past, though they were discontinued due to side effects.    Informed Consent   Shared Decision Making/Informed Consent The risks [chest pain, shortness of breath, cardiac arrhythmias, dizziness, blood pressure fluctuations, myocardial infarction, stroke/transient ischemic attack, nausea, vomiting, allergic reaction,  radiation exposure, metallic taste sensation and life-threatening complications (estimated to be 1 in 10,000)], benefits (risk stratification, diagnosing coronary artery disease, treatment guidance) and alternatives of a nuclear stress test were discussed in detail with Mr. Pflaum and he agrees to proceed.     Dispo: Return to clinic in 6 months.  Signed, Lonni Hanson, MD  "

## 2024-07-30 ENCOUNTER — Encounter: Payer: Self-pay | Admitting: Internal Medicine

## 2024-07-30 ENCOUNTER — Encounter: Payer: Self-pay | Admitting: Allergy and Immunology

## 2024-07-30 DIAGNOSIS — R0789 Other chest pain: Secondary | ICD-10-CM | POA: Insufficient documentation

## 2024-07-30 DIAGNOSIS — R0609 Other forms of dyspnea: Secondary | ICD-10-CM | POA: Insufficient documentation

## 2024-08-05 ENCOUNTER — Encounter

## 2024-08-13 ENCOUNTER — Ambulatory Visit: Admitting: Family Medicine

## 2024-08-24 ENCOUNTER — Other Ambulatory Visit (HOSPITAL_COMMUNITY)

## 2024-08-24 ENCOUNTER — Other Ambulatory Visit

## 2024-10-21 ENCOUNTER — Ambulatory Visit: Admitting: Sleep Medicine

## 2025-03-29 ENCOUNTER — Ambulatory Visit
